# Patient Record
Sex: Male | Born: 1945 | Race: White | Hispanic: No | Marital: Married | Smoking: Never smoker
Health system: Western US, Community
[De-identification: ages and names within clinical notes are randomized; demographics above are authoritative.]

## PROBLEM LIST (undated history)

## (undated) DIAGNOSIS — E785 Hyperlipidemia, unspecified: Secondary | ICD-10-CM

## (undated) DIAGNOSIS — I10 Essential (primary) hypertension: Secondary | ICD-10-CM

## (undated) DIAGNOSIS — R011 Cardiac murmur, unspecified: Secondary | ICD-10-CM

## (undated) DIAGNOSIS — I2510 Atherosclerotic heart disease of native coronary artery without angina pectoris: Secondary | ICD-10-CM

## (undated) DIAGNOSIS — K219 Gastro-esophageal reflux disease without esophagitis: Secondary | ICD-10-CM

## (undated) HISTORY — DX: Atherosclerotic heart disease of native coronary artery without angina pectoris: I25.10

## (undated) HISTORY — DX: Gastro-esophageal reflux disease without esophagitis: K21.9

## (undated) HISTORY — PX: HERNIA REPAIR: SHX51

## (undated) HISTORY — DX: Cardiac murmur, unspecified: R01.1

## (undated) HISTORY — PX: CHOLECYSTECTOMY: SHX55

## (undated) HISTORY — PX: OTHER SURGICAL HISTORY: SHX169

---

## 2005-07-23 ENCOUNTER — Ambulatory Visit: Payer: Self-pay | Admitting: Internal Medicine

## 2005-09-03 ENCOUNTER — Ambulatory Visit: Payer: Self-pay | Admitting: Internal Medicine

## 2006-09-09 ENCOUNTER — Ambulatory Visit: Payer: Self-pay | Admitting: Internal Medicine

## 2006-09-09 LAB — CONVERTED CEMR LAB
ALT: 34 units/L (ref 0–40)
Albumin: 4.1 g/dL (ref 3.5–5.2)
Alkaline Phosphatase: 54 units/L (ref 39–117)
BUN: 15 mg/dL (ref 6–23)
Basophils Absolute: 0 10*3/uL (ref 0.0–0.1)
Basophils Relative: 0.5 % (ref 0.0–1.0)
CO2: 31 meq/L (ref 19–32)
Calcium: 9 mg/dL (ref 8.4–10.5)
Creatinine, Ser: 1 mg/dL (ref 0.4–1.5)
HDL: 41.2 mg/dL (ref >39.0)
LDL Cholesterol: 93 mg/dL (ref 0–99)
Monocytes Relative: 8 % (ref 3.0–11.0)
Platelets: 222 10*3/uL (ref 150–400)
RBC: 5.11 M/uL (ref 4.22–5.81)
RDW: 11.5 % (ref 11.5–14.6)
Total Bilirubin: 1.2 mg/dL (ref 0.3–1.2)
Total CHOL/HDL Ratio: 3.9
Total Protein: 7.2 g/dL (ref 6.0–8.3)
Triglycerides: 138 mg/dL (ref 0–149)
VLDL: 28 mg/dL (ref 0–40)

## 2006-09-16 ENCOUNTER — Ambulatory Visit: Payer: Self-pay | Admitting: Internal Medicine

## 2007-07-03 ENCOUNTER — Encounter: Payer: Self-pay | Admitting: Internal Medicine

## 2007-07-04 ENCOUNTER — Encounter: Payer: Self-pay | Admitting: Internal Medicine

## 2007-07-21 ENCOUNTER — Encounter: Payer: Self-pay | Admitting: Internal Medicine

## 2007-07-24 ENCOUNTER — Encounter: Payer: Self-pay | Admitting: Internal Medicine

## 2008-07-19 ENCOUNTER — Encounter: Payer: Self-pay | Admitting: Internal Medicine

## 2010-08-03 NOTE — Op Note (Signed)
 SURGEON: Marena Chancy, MD.  PROCEDURE:  Colonoscopy with biopsy.    PREOPERATIVE DIAGNOSIS:  Colorectal cancer screening.    POSTOPERATIVE DIAGNOSES:  1.  Tiny colon polyp at 20 cm from the anal verge, status post excisional  biopsy.  2.  Diverticulosis coli.    ASA SCORE:  ASA class II.    INFORMED CONSENT:  A full PARQ discussion was held. The patient signed the  consent form.    PREMEDICATIONS:  Propofol was given in divided doses and titrated to  effect.    DESCRIPTION OF THE PROCEDURE:  A digital rectal exam was done, which was  normal.  The flexible colonoscope was then inserted into the rectum and  advanced under direct visualization to the cecum, as shown by the ileocecal  valve and triangular ligament. The scope was slowly and carefully  withdrawn. A U-turn was done in the rectum.  Withdrawal time was over 6  minutes.    FINDINGS: The bowel prep was excellent.  There were no mass lesions or  areas of colitis.  There were diverticular changes in the left colon, which  were moderate.  At 20 cm there was a tiny excrescence of about 2 mm  diameter, which was taken with a single bite of the biopsy forceps.  No  other lesions were noted.  The scope was withdrawn.  The patient tolerated  the procedure well.  He was observed until awake and alert, and discharged  home in the company of his wife.    ASSESSMENT AND PLAN:  The patient will follow with Dr. Roxan Hockey on an  ongoing basis for his primary care.  I will be happy to see him in the  future as desired, but for now followup exam will be in 5 years if this is  an adenomatous polyp, or in 10 years if it is a hyperplastic polyp.                  Electronically Signed By  Marena Chancy, MD 08/03/2010 08:30 Demetrius Charity  Marena Chancy, MD    AH/bh  D: 08/02/2010 01:25 P   T: 08/03/2010 01:05 P   JOB: [id number]   DOCUMENT:  [id number]    cc: Marena Chancy, MD     DONALD P. ROBERTSON, DO     MICHAEL TRoxan Hockey, DO

## 2011-11-09 DIAGNOSIS — I10 Essential (primary) hypertension: Secondary | ICD-10-CM | POA: Insufficient documentation

## 2016-06-18 NOTE — Procedures (Signed)
[facility]  [address] [place], [place] [address]   [phone]    Pulmonary Function    Patient: Evan [name], Evan [name]    MRN: [id number]   Dept: OMD PMF         Adm Date: 06/14/2016      DOB: 07-Jul-1945     Date of Dictation:06/18/16  Referring Physician: Eloisa Northern August Saucer  is a 57 yrs year old male  with a BMI of 53 and a positive history of smoking who had the following pulmonary function test as an evaluation for emphysema and shortness of breath:  1. Spirometry with flow-volume loop pre and post acute bronchodilator therapy.  2. Lung volumes using plethysmography.  3. Carbon monoxide diffusion capacity.  4. 6-Minute Walk Test    RESULTS: For full results, please see Media tab in Chart Review in Epic.  Spirometry: obstruction without significant bronchodilator response  Lung Volumes: Normal  Diffuse Capacity: Mildly reduced  6-MWT:   The pt was able to walk 180 meters.  The predicted lower limit of normal in this patient's case is 413 meters.     Oxygenation: SaO2 At rest: 96%.  The lowest oxygen saturation during exercise was 92%.   Heart Rate Response: normal  Blood Pressure Response: Elevated blood pressure at rest    IMPRESSION:   COPD GOLD 2.    The diffusion capacity is moderately reduced. A reduced DLCO can be caused by anemia, heart disease, emphysema, pulmonary hypertension, pulmonary embolism, current tobacco use and interstitial lung disease. The workup for these varied etiologies should be directed by the signs and symptoms on clinic examination.     Exercise capacity is greatly reduced on . Oxygenation decreases with ambulation but does not meet requirement for supplemental oxygen. Heart rate response is normal. Blood pressure is elevated at baseline.          Electronically Signed by:  Claudell Kyle, MD  06/18/2016 20:28

## 2016-07-11 ENCOUNTER — Emergency Department: Admit: 2016-07-12 | Payer: PRIVATE HEALTH INSURANCE | Primary: DO

## 2016-07-11 DIAGNOSIS — A419 Sepsis, unspecified organism: Secondary | ICD-10-CM

## 2016-07-11 NOTE — ED Notes (Signed)
Agree with triage note. Breathing even and unlabored. No difficulty talking. Does not normally wear O2 at home. Requiring 4 L of O2 NC currently. Skin warm to touch. Chills x 1 day, denies being around any sick family members.

## 2016-07-11 NOTE — ED Provider Notes (Signed)
[facility] DEPART[facility] VISIT NOTE     History and Physical     Name: Evan [name]  DOB: 05-03-1946 71 y.o.  MRN: [id number]  CSN: [id number]    HISTORY:     Chief Complaint:   Chief Complaint   Patient presents with   . Weakness   . Shortness of Breath       HPI: This is a 71 y.o. male who presents with a complaint of feeling chills and feeling shaky today.  He denies actual shortness of breath although he is tachypneic and was satting in the high 80s per medics.[name] He then fell on the floor. He denies known injury. He doesn't remember falling.  He denies new shortness of breath.  Patient denied any chest pain,  abdominal pain, nausea, vomiting, diarrhea, constipation, melena, hematochezia, urinary symptoms, rash, headache.  He has had a nonproductive cough for 1-2 weeks.      Review of Systems: Is negative in all 10 systems except for noted above.    Nurses notes were reviewed.    Past Medical History:   Past Medical History:   Diagnosis Date   . COPD (chronic obstructive pulmonary disease) (CMS/HCC)    . Diabetes mellitus, type II (CMS/HCC)    . Hypercholesteremia    . Hypertension    . Obesity, morbid (CMS/HCC)        Past Surgical History: No past surgical history on file.    Medications:   Home Medications     No medications on file.          Social History:  Social History     Social History Main Topics   . Smoking status: Not on file   . Smokeless tobacco: Not on file   . Alcohol use No   . Drug use: No   . Sexual activity: Not on file     Social History   . Marital status: Married     Spouse name: N/A   . Number of children: N/A   . Years of education: N/A     Other Topics Concern   . Not on file       Family History: No family history on file.    Allergies:No Known Allergies    Physical Exam:   Initial Vitals [07/11/16 1938]   BP 132/69   Pulse 105   Resp (!) 26   Temp 38 ?C (100.4 ?F)   SpO2 (!) 88 %     General: Patient is mildly tachypneic but not in respiratory distress. Patient  appears in mild to moderate discomfort. He is somewhat tachypneic but denies feeling short of breath.  HEENT: Head is normocephalic and atraumatic.  Lids and lashes were normal.  Sclera is anicteric.  There is no conjunctival injection.  PERRL.  EOMI.   There is no nasal discharge and no epistasis.There is moist oral mucosa.  There is no mal-occlusion or trismus.  There is no pharyngeal exudate.  Uvula is midline. Neck is non-tender and supple.  There is a full range of motion without pain.  Cardiovascular: There is a regular tachycardia but normal rhythm.  No murmur is noted.  Radial pulses are 2/4 bilaterally. DP pulses are 1+ bilaterally.  Respiratory: The chest wall is non-tender.  There is equal expansion and equal breath sounds.  No retractions are noted.  There is decrease rest sounds bilaterally likely secondary to large body habitus.  Abdominal:  The abdomen is non-tender and not distended. There  were no peritoneal signs.  No mass or hepatosplenomegally was noted. No palpable aneurysm was noted. Bowel sounds were normal.  Musculoskeletal: Arms, legs and back were non-tender.  There was 2+ pitting edema in the pedal and pretibial region which she states is baseline.  No CVAT was noted.  Neuro: Speech was normal.  Strength was 5/5 bilaterally.  Gait was deferred.  Sensation was normal.    Psych: The patient is alert and oriented x 3.  Memory and insight were good.  Skin:  Skin is warm and dry.  Capillary refill was within 3 seconds. Patient has erythema and warmth over his left calf.  Lymph: There was no cervical lymphadenopathy noted.      Assessment and Plan: This is a 71 y.o. male presenting with fever, nonproductive cough, left extremity cellulitis, generalized weakness and sounds like he had a syncopal episode earlier today. He is somewhat tachypneic but lungs sounds are difficult to auscultate likely secondary to his large body habitus. He has been coughing. He denies any history of DVT, PE, recent  immobilization. Differential diagnosis includes sepsis, cellulitis, DVT, pneumonia, influenza, PE, intracranial hemorrhage, cardiac ischemia, arrhythmia, hypoglycemia, anemia, COPD exacerbation, CHF. UTI. Baseline work up was ordered to assess for the above. DuoNeb and Tylenol was ordered for the patient is well as generalized workup to assess for the above. Reassessment to follow.    Reassessment: On reassessment, patient was reassessed and work up was reviewed. Patient feels much improved after treatment. He is not dyspneic. He is on a nasal cannula with an O2 sat of 94%. Chest x-ray did not show an infiltrate although I suspect there still could be an occult pneumonia. He definitely has left calf cellulitis. The duplex of the lower extremities was somewhat limited in the calves but otherwise was negative. She does have an elevated troponin of 0.11 blood sugar was just over 300 and I ordered insulin 4 units subcutaneously. Patient has an elevated white blood cell count at 16 and a lactic acid level of 4. I chose cefepime for broad coverage. I've consulted the hospitalist transfer care, patient is in serious but improving condition. I ordered a liter of saline for him. As his chest x-ray shows mild central vascular congestion and I did not want to give him more IV fluid at this point. He is no longer short of breath and vital signs are stable at this point. At transfer of care to hospital service.    EKG shows right bundle branch block at 92 bpm with QRS duration 151 ms and QTC 413 ms. There is some nonspecific ST elevation in lead 3 area and I did run this by Dr. Allena Katz from cardiology who states this is a right bundle branch block with aberrancy and not a STEMI activation. Patient has no chest pain.  He noted the patient could use an echocardiogram tomorrow if troponin is elevated at that point cardiology should be consulted.      CT head without contrast   Final Result   IMPRESSION:   1.  Negative CT examination  of brain for acute abnormality.         X-ray chest PA or AP   Final Result   IMPRESSION:       1. Mild central vascular congestion.   2. Borderline mild cardiomegaly.            Ultrasound extremity bilateral venous duplex    (Results Pending)       Labs:   Results  for orders placed or performed during the hospital encounter of 07/11/16 (from the past 24 hour(s))   CBC with Auto Differential -STAT    Collection Time: 07/11/16  7:47 PM   Result Value Ref Range    WBC 16.6 (H) 4.8 - 10.8 10*3/?L    RBC 3.72 (L) 4.70 - 6.10 10*6/?L    Hemoglobin 12.5 12.0 - 18.0 g/dL    HCT 47.8 (L) 29.5 - 54.0 %    MCV 100.9 (H) 81.0 - 99.0 fL    MCH 33.6 27.0 - 34.0 pg    MCHC 33.3 32.0 - 36.0 g/dL    RDW 62.1 30.8 - 65.7 %    Platelet Count 170 150 - 400 10*3/?L    MPV 10.8 (H) 7.4 - 10.4 fL    Neutrophils % 93 (H) 35 - 70 %    Bands % 1 0 - 10 %    Lymphocytes % 2 (L) 25 - 45 %    Monocytes % 4 0 - 12 %    Eosinophils % 0 0 - 7 %    Basophils % 0 0 - 2 %    Neutrophils, Absolute 15.4 (H) 1.6 - 7.3 10*3/?L    Bands, Absolute 0.2 0.0 - 1.2 10*3/?L    Lymphocytes, Absolute 0.3 (L) 1.1 - 4.3 10*3/?L    Monocytes, Absolute 0.7 0.0 - 1.2 10*3/?L    Eosinophils, Absolute 0.0 0.0 - 0.7 10*3/?L    Basophils, Absolute 0.0 0.0 - 0.2 10*3/?L    Differential Type Manual Differential     Platelet Estimate Normal Normal    Macrocytes 1+ (A) (none)   Comprehensive Metabolic Panel -STAT    Collection Time: 07/11/16  7:47 PM   Result Value Ref Range    Sodium 137 135 - 143 mmol/L    Potassium 4.8 3.5 - 5.1 mmol/L    Chloride 103 98 - 111 mmol/L    CO2 - Carbon Dioxide 18.9 (L) 21.0 - 31.0 mmol/L    Glucose 306 (H) 80 - 99 mg/dL    BUN 36 (H) 6 - 23 mg/dL    Creatinine 8.46 (H) 0.65 - 1.30 mg/dL    Calcium 9.1 8.6 - 96.2 mg/dL    AST - Aspartate Aminotransferase 22 10 - 50 IU/L    ALT - Alanine Aminotransferase 21 7 - 52 IU/L    Alkaline Phosphatase 60 34 - 104 IU/L    Bilirubin Total 0.5 0.3 - 1.2 mg/dL    Protein Total 7.2 6.0 - 8.0 g/dL     Albumin 3.9 3.5 - 5.0 g/dL    Globulin 3.3 2.2 - 3.7 g/dL    Albumin/Globulin Ratio 1.2 >0.9    Anion Gap 15.1 (H) 3.0 - 11.0 mmol/L    GFR Estimate 37 (L) >=60 mL/min/1.80m*2    GFR Additional Info     Troponin-I ED -STAT    Collection Time: 07/11/16  7:47 PM   Result Value Ref Range    Troponin I 0.11 (HCrit) <=0.04 ng/mL   PT & PTT -STAT    Collection Time: 07/11/16  7:47 PM   Result Value Ref Range    Protime 12.4 10.0 - 13.0 Seconds    APTT 22 (L) 23 - 36 Seconds    INR 1.1 0.9 - 1.2   B-Type Natriuretic Peptide (BNP) -STAT    Collection Time: 07/11/16  7:47 PM   Result Value Ref Range    B-Type Natriuretic Peptide (BNP) 149 (H) 0 - 99 pg/mL  Rapid Influenza A & B by NAAT(Stat) -STAT    Collection Time: 07/11/16  9:17 PM   Result Value Ref Range    Influenza A Influenza A Viral RNA Not Detected Influenza A Viral RNA Not Detected    Influenza B Influenza B Viral RNA Not Detected Influenza B Viral RNA Not Detected   Blood Culture -x 2    Collection Time: 07/11/16  9:37 PM   Result Value Ref Range    Blood Culture Result No Growth to Date    Blood Culture -x 2    Collection Time: 07/11/16  9:37 PM   Result Value Ref Range    Blood Culture Result No Growth to Date    Lactic Acid (ED) -STAT    Collection Time: 07/11/16  9:37 PM   Result Value Ref Range    Lactic Acid ED 4.0 (H) 0.5 - 2.2 mmol/L   Urinalysis with Microscopic -Once    Collection Time: 07/11/16 10:17 PM   Result Value Ref Range    Color, Urine Yellow Yellow, Straw    Clarity, Urine Hazy (A) Clear    Glucose, Urine Negative Negative mg/dL    Bilirubin, Urine Negative Negative    Ketones, Urine Trace (A) Negative mg/dL    Specific Gravity, Urine 1.019 1.003 - 1.030    Blood, Urine 3+ (A) Negative    pH, UA 5.0 5.0 - 7.5    Protein, Urine 2+ (A) Negative mg/dL    Urobilinogen, Urine Normal Normal mg/dL    Nitrite, Urine Negative Negative    Leukocyte Esterase, Urine Negative Negative    Ascorbic Acid, Urine Negative Negative    White Blood Cell  Microscopic Numeric 1 <=9 /HPF    RBC, Urine 3 <=5 /HPF    Bacteria, Urine None Seen None Seen /HPF    Squamous Epithelial, Urine <1 <=5 /HPF    Hyaline Casts, Urine 1 <=5 /LPF    Mucous, Urine 1+ 0 /LPF     Critical Care Time: 45 minutes.     Time involved in the performance of a separately reportable procedure was not counted towards the total critical care Time.    Disposition: Admission    Diagnosis:    SNOMED CT(R)   1. Fever  FEVER   2. Weakness generalized  ASTHENIA   3. Fever, unknown origin  PYREXIA OF UNKNOWN ORIGIN   4. Cellulitis of left lower extremity  CELLULITIS OF LEFT LOWER LIMB   5. Syncope and collapse  SYNCOPE AND COLLAPSE   6. Fall, initial encounter  FALL   7. Elevated troponin  HIGH TROPONIN I LEVEL   8. Hyperglycemia  HYPERGLYCEMIA   9. COPD exacerbation (CMS/HCC)  ACUTE EXACERBATION OF CHRONIC OBSTRUCTIVE AIRWAYS DISEASE   10. Sepsis due to undetermined organism (CMS/HCC)  SEPSIS      Critical Care Time: 45 minutes.     Time involved in the performance of a separately reportable procedure was not counted towards the total critical care Time.        DECISION TO ADMIT:   1/24 2356        This dictation was produced using voice recognition software. Despite concurrent proofreading, please note that transcription errors are common and may not reflect the true intent of the reporter.     Alwyn Ren, DO  07/11/16 2358

## 2016-07-11 NOTE — ED Notes (Signed)
Pt to CT via gurney with transport and techs to assist with moving.

## 2016-07-11 NOTE — ED Triage Notes (Signed)
Pt reports increasing weakness with difficulty walking as a result. SOB, O2sats 88 on RA in ambo. Now 92% on 4L NC. Also reports urgency with urination. Febrile.

## 2016-07-11 NOTE — ED Notes (Signed)
LLE red and warm to touch.

## 2016-07-11 NOTE — ED Notes (Signed)
RT at bedside.

## 2016-07-11 NOTE — ED Notes (Signed)
Phleb at bedside. RT aware of need for breathing treatment

## 2016-07-11 NOTE — ED Notes (Signed)
Elelvated trop 0.11  MD notified.

## 2016-07-12 ENCOUNTER — Inpatient Hospital Stay: Admit: 2016-07-12 | Payer: PRIVATE HEALTH INSURANCE | Primary: DO

## 2016-07-12 ENCOUNTER — Inpatient Hospital Stay
Admission: EM | Admit: 2016-07-12 | Discharge: 2016-08-02 | Disposition: A | Discharge status: Skilled Nursing Facility | Payer: PRIVATE HEALTH INSURANCE | Admitting: MD

## 2016-07-12 LAB — CBC WITH AUTO DIFFERENTIAL
Bands %: 1 % (ref 0–10)
Bands, Absolute: 0.2 10*3/??L (ref 0.0–1.2)
Basophils %: 0 % (ref 0–2)
Basophils %: 1 % (ref 0–2)
Basophils, Absolute: 0 10*3/??L (ref 0.0–0.2)
Basophils, Absolute: 0.1 10*3/ÂµL (ref 0.0–0.2)
Eosinophils %: 0 % (ref 0–7)
Eosinophils %: 0 % (ref 0–7)
Eosinophils, Absolute: 0 10*3/??L (ref 0.0–0.7)
Eosinophils, Absolute: 0 10*3/ÂµL (ref 0.0–0.7)
HCT: 37.5 % — ABNORMAL LOW (ref 42.0–54.0)
HCT: 36.4 % — ABNORMAL LOW (ref 42.0–54.0)
Hemoglobin: 12.5 g/dL (ref 12.0–18.0)
Hemoglobin: 12 g/dL (ref 12.0–18.0)
Lymphocytes %: 2 % — ABNORMAL LOW (ref 25–45)
Lymphocytes %: 5 % — ABNORMAL LOW (ref 25–45)
Lymphocytes, Absolute: 0.3 10*3/??L — ABNORMAL LOW (ref 1.1–4.3)
Lymphocytes, Absolute: 1 10*3/??L — ABNORMAL LOW (ref 1.1–4.3)
MCH: 33.6 pg (ref 27.0–34.0)
MCH: 33.3 pg (ref 27.0–34.0)
MCHC: 33.3 g/dL (ref 32.0–36.0)
MCHC: 33 g/dL (ref 32.0–36.0)
MCV: 100.9 fL — ABNORMAL HIGH (ref 81.0–99.0)
MCV: 100.9 fL — ABNORMAL HIGH (ref 81.0–99.0)
MPV: 10.8 fL — ABNORMAL HIGH (ref 7.4–10.4)
MPV: 10.5 fL — ABNORMAL HIGH (ref 7.4–10.4)
Monocytes %: 4 % (ref 0–12)
Monocytes %: 6 % (ref 0–12)
Monocytes, Absolute: 0.7 10*3/??L (ref 0.0–1.2)
Monocytes, Absolute: 1.2 10*3/ÂµL (ref 0.0–1.2)
Neutrophils %: 93 % — ABNORMAL HIGH (ref 35–70)
Neutrophils %: 88 % — ABNORMAL HIGH (ref 35–70)
Neutrophils, Absolute: 15.4 10*3/??L — ABNORMAL HIGH (ref 1.6–7.3)
Neutrophils, Absolute: 17.2 10*3/ÂµL — ABNORMAL HIGH (ref 1.6–7.3)
Platelet Count: 170 10*3/??L (ref 150–400)
Platelet Count: 154 10*3/ÂµL (ref 150–400)
Platelet Estimate: NORMAL
RBC: 3.72 10*6/??L — ABNORMAL LOW (ref 4.70–6.10)
RBC: 3.6 10*6/ÂµL — ABNORMAL LOW (ref 4.70–6.10)
RDW: 14 % (ref 11.5–14.5)
RDW: 14.1 % (ref 11.5–14.5)
WBC: 16.6 10*3/??L — ABNORMAL HIGH (ref 4.8–10.8)
WBC: 19.6 10*3/??L — ABNORMAL HIGH (ref 4.8–10.8)

## 2016-07-12 LAB — UA WITH AUTO MICRO
Ascorbic Acid, Urine: NEGATIVE
Bacteria, Urine: NONE SEEN /HPF
Bilirubin, Urine: NEGATIVE
Glucose, Urine: NEGATIVE mg/dL
Hyaline Casts, Urine: 1 /LPF (ref <=5)
Leukocyte Esterase, Urine: NEGATIVE
Nitrite, Urine: NEGATIVE
RBC, Urine: 3 /HPF (ref <=5)
Specific Gravity, Urine: 1.019 (ref 1.003–1.030)
Squamous Epithelial, Urine: <1 /HPF (ref <=5)
Urobilinogen, Urine: NORMAL mg/dL
White Blood Cell Microscopic Numeric: 1 /HPF (ref <=9)
pH, UA: 5 (ref 5.0–7.5)

## 2016-07-12 LAB — TROPONIN I
Troponin I: 1.94 ng/mL (ref <=0.04)
Troponin I: 4.53 ng/mL (ref <=0.04)

## 2016-07-12 LAB — BASIC METABOLIC PANEL
Anion Gap: 13.1 mmol/L — ABNORMAL HIGH (ref 3.0–11.0)
Anion Gap: 12.4 mmol/L — ABNORMAL HIGH (ref 3.0–11.0)
BUN: 36 mg/dL — ABNORMAL HIGH (ref 6–23)
BUN: 35 mg/dL — ABNORMAL HIGH (ref 6–23)
CO2 - Carbon Dioxide: 21.9 mmol/L (ref 21.0–31.0)
CO2 - Carbon Dioxide: 21.6 mmol/L (ref 21.0–31.0)
Calcium: 8.9 mg/dL (ref 8.6–10.3)
Calcium: 8.6 mg/dL (ref 8.6–10.3)
Chloride: 103 mmol/L (ref 98–111)
Chloride: 102 mmol/L (ref 98–111)
Creatinine: 1.89 mg/dL — ABNORMAL HIGH (ref 0.65–1.30)
Creatinine: 1.88 mg/dL — ABNORMAL HIGH (ref 0.65–1.30)
GFR Estimate: 35 mL/min/{1.73_m2} — ABNORMAL LOW (ref >=60)
GFR Estimate: 36 mL/min/{1.73_m2} — ABNORMAL LOW (ref >=60)
Glucose: 259 mg/dL — ABNORMAL HIGH (ref 80–99)
Glucose: 310 mg/dL — ABNORMAL HIGH (ref 80–99)
Potassium: 5.3 mmol/L — ABNORMAL HIGH (ref 3.5–5.1)
Potassium: 3.8 mmol/L (ref 3.5–5.1)
Sodium: 138 mmol/L (ref 135–143)
Sodium: 136 mmol/L (ref 135–143)

## 2016-07-12 LAB — BLOOD CULTURE MRSA BY PCR
MRSA PCR Result: NOT DETECTED
Staph aureus PCR Result: NOT DETECTED

## 2016-07-12 LAB — PT & PTT
APTT: 22 Seconds — ABNORMAL LOW (ref 23–36)
INR: 1.1 (ref 0.9–1.2)
Protime: 12.4 Seconds (ref 10.0–13.0)

## 2016-07-12 LAB — LACTIC ACID
Lactic Acid: 2.7 mmol/L — ABNORMAL HIGH (ref 0.5–2.2)
Lactic Acid: 2.4 mmol/L — ABNORMAL HIGH (ref 0.0–2.2)

## 2016-07-12 LAB — TROPONIN-I ED: Troponin I: 0.11 ng/mL (ref <=0.04)

## 2016-07-12 LAB — COMPREHENSIVE METABOLIC PANEL
ALT - Alanine Aminotransferase: 21 IU/L (ref 7–52)
AST - Aspartate Aminotransferase: 22 IU/L (ref 10–50)
Albumin/Globulin Ratio: 1.2 (ref >0.9)
Albumin: 3.9 g/dL (ref 3.5–5.0)
Alkaline Phosphatase: 60 IU/L (ref 34–104)
Anion Gap: 15.1 mmol/L — ABNORMAL HIGH (ref 3.0–11.0)
BUN: 36 mg/dL — ABNORMAL HIGH (ref 6–23)
Bilirubin Total: 0.5 mg/dL (ref 0.3–1.2)
CO2 - Carbon Dioxide: 18.9 mmol/L — ABNORMAL LOW (ref 21.0–31.0)
Calcium: 9.1 mg/dL (ref 8.6–10.3)
Chloride: 103 mmol/L (ref 98–111)
Creatinine: 1.82 mg/dL — ABNORMAL HIGH (ref 0.65–1.30)
GFR Estimate: 37 mL/min/{1.73_m2} — ABNORMAL LOW (ref >=60)
Globulin: 3.3 g/dL (ref 2.2–3.7)
Glucose: 306 mg/dL — ABNORMAL HIGH (ref 80–99)
Potassium: 4.8 mmol/L (ref 3.5–5.1)
Protein Total: 7.2 g/dL (ref 6.0–8.0)
Sodium: 137 mmol/L (ref 135–143)

## 2016-07-12 LAB — POCT GLUCOSE
POC Glucose: 309 mg/dL — ABNORMAL HIGH (ref 80–99)
POC Glucose: 286 mg/dL — ABNORMAL HIGH (ref 80–99)

## 2016-07-12 LAB — LACTIC ACID ED: Lactic Acid ED: 4 mmol/L — ABNORMAL HIGH (ref 0.5–2.2)

## 2016-07-12 LAB — ED INFORMATION EXCHANGE

## 2016-07-12 LAB — BLOOD CULTURE
Blood Culture Result: NO GROWTH
Blood Culture Result: POSITIVE — AB

## 2016-07-12 LAB — B-TYPE NATRIURETIC PEPTIDE: B-Type Natriuretic Peptide - BNP: 149 pg/mL — ABNORMAL HIGH (ref 0–99)

## 2016-07-12 LAB — INFLUENZA A & B
Influenza A: NOT DETECTED
Influenza B: NOT DETECTED

## 2016-07-12 LAB — MAGNESIUM: Magnesium: 1.7 mg/dL (ref 1.6–2.4)

## 2016-07-12 MED ORDER — albuterol (PROVENTIL HFA;VENTOLIN HFA;PROAIR HFA) inhaler 2 puff
90 | RESPIRATORY_TRACT | Status: DC | PRN
Start: 2016-07-12 — End: 2016-07-12

## 2016-07-12 MED ORDER — enoxaparin (LOVENOX) syringe 150 mg
150 | Freq: Two times a day (BID) | SUBCUTANEOUS | Status: DC
Start: 2016-07-12 — End: 2016-07-15
  Administered 2016-07-13 – 2016-07-15 (×5): 150 mg via SUBCUTANEOUS

## 2016-07-12 MED ORDER — cefTRIAXone (ROCEPHIN) 1 g in sterile water 10 mL IV syringe
1 | INTRAVENOUS | Status: DC
Start: 2016-07-12 — End: 2016-07-12
  Administered 2016-07-12: 11:00:00 via INTRAVENOUS

## 2016-07-12 MED ORDER — polyvinyl alcohol (LIQUIFILM TEARS) 1.4 % ophthalmic solution 1-2 drop
1.4 | OPHTHALMIC | Status: DC | PRN
Start: 2016-07-12 — End: 2016-08-02

## 2016-07-12 MED ORDER — acetaminophen (TYLENOL) tablet 650 mg
325 | Freq: Once | ORAL | Status: AC
Start: 2016-07-12 — End: 2016-07-11
  Administered 2016-07-12: 05:00:00 325 mg via ORAL

## 2016-07-12 MED ORDER — glucagon (human recombinant) injection 1 mg
1 | INTRAMUSCULAR | Status: DC | PRN
Start: 2016-07-12 — End: 2016-08-02

## 2016-07-12 MED ORDER — benzocaine-menthol (CEPACOL) lozenge 1 lozenge
15-2.6 | Status: DC | PRN
Start: 2016-07-12 — End: 2016-08-02
  Administered 2016-07-17 – 2016-07-18 (×2): via ORAL

## 2016-07-12 MED ORDER — aspirin EC tablet 81 mg
81 | Freq: Every day | ORAL | Status: DC
Start: 2016-07-12 — End: 2016-08-02
  Administered 2016-07-12 – 2016-08-02 (×22): 81 mg via ORAL

## 2016-07-12 MED ORDER — dextrose (GLUCOSE SHOT) oral liquid 30 g
15 | ORAL | Status: DC | PRN
Start: 2016-07-12 — End: 2016-08-02

## 2016-07-12 MED ORDER — phenol (CHLORASEPTIC) 1.4 % throat spray 1-2 spray
1.4 | Status: DC | PRN
Start: 2016-07-12 — End: 2016-08-02

## 2016-07-12 MED ORDER — nicotine polacrilex (NICORETTE) gum 2 mg
2 | BUCCAL | Status: DC | PRN
Start: 2016-07-12 — End: 2016-08-02

## 2016-07-12 MED ORDER — insulin glargine (LANTUS) injection 14 Units
100 | Freq: Every evening | SUBCUTANEOUS | Status: DC
Start: 2016-07-12 — End: 2016-07-13
  Administered 2016-07-13: 05:00:00 100 [IU] via SUBCUTANEOUS

## 2016-07-12 MED ORDER — albuterol (PROVENTIL) nebulizer solution 2.5 mg
2.5 | Freq: Four times a day (QID) | RESPIRATORY_TRACT | Status: DC
Start: 2016-07-12 — End: 2016-07-15
  Administered 2016-07-12 – 2016-07-15 (×8): 2.5 mg via RESPIRATORY_TRACT

## 2016-07-12 MED ORDER — cefTRIAXone (ROCEPHIN) 1 g in sterile water 10 mL IV syringe
1 | INTRAMUSCULAR | Status: DC
Start: 2016-07-12 — End: 2016-07-14
  Administered 2016-07-12 – 2016-07-14 (×3): via INTRAVENOUS

## 2016-07-12 MED ORDER — acetaminophen (TYLENOL) tablet 650 mg
325 | ORAL | Status: DC | PRN
Start: 2016-07-12 — End: 2016-08-02
  Administered 2016-07-12 – 2016-07-14 (×3): 325 mg via ORAL

## 2016-07-12 MED ORDER — insulin glargine (LANTUS) injection 10 Units
100 | Freq: Every evening | SUBCUTANEOUS | Status: DC
Start: 2016-07-12 — End: 2016-07-12
  Administered 2016-07-12: 11:00:00 100 [IU] via SUBCUTANEOUS

## 2016-07-12 MED ORDER — dextrose 50 % syringe 12.5-25 g
INTRAVENOUS | Status: DC | PRN
Start: 2016-07-12 — End: 2016-08-02

## 2016-07-12 MED ORDER — HYDROcodone-acetaminophen (NORCO) 5-325 mg per tablet 1 tablet
5-325 | Freq: Four times a day (QID) | ORAL | Status: DC | PRN
Start: 2016-07-12 — End: 2016-08-02
  Administered 2016-07-18: 18:00:00 5-325 via ORAL

## 2016-07-12 MED ORDER — ipratropium-albuterol (DUO-NEB) 0.5 mg-3 mg(2.5 mg base)/3 mL nebulizer solution 3 mL
0.5 | Freq: Once | RESPIRATORY_TRACT | Status: AC
Start: 2016-07-12 — End: 2016-07-11
  Administered 2016-07-12: 06:00:00 0.5 mL via RESPIRATORY_TRACT

## 2016-07-12 MED ORDER — sodium chloride 0.9 % (NS) syringe 5-10 mL
Freq: Three times a day (TID) | INTRAMUSCULAR | Status: DC
Start: 2016-07-12 — End: 2016-08-02
  Administered 2016-07-12 – 2016-08-02 (×65): via INTRAVENOUS

## 2016-07-12 MED ORDER — metoprolol tartrate (LOPRESSOR) tablet 100 mg
50 | Freq: Two times a day (BID) | ORAL | Status: DC
Start: 2016-07-12 — End: 2016-07-14
  Administered 2016-07-12 – 2016-07-14 (×4): 50 mg via ORAL

## 2016-07-12 MED ORDER — enoxaparin (LOVENOX) syringe 150 mg
150 | Freq: Once | SUBCUTANEOUS | Status: AC
Start: 2016-07-12 — End: 2016-07-12
  Administered 2016-07-13: 01:00:00 150 mg via SUBCUTANEOUS

## 2016-07-12 MED ORDER — polyethylene glycol (MIRALAX) packet 17 g
17 | Freq: Every day | ORAL | Status: DC | PRN
Start: 2016-07-12 — End: 2016-07-17
  Administered 2016-07-17: 06:00:00 17 g via ORAL

## 2016-07-12 MED ORDER — albuterol (PROVENTIL) nebulizer solution 2.5 mg
2.5 | RESPIRATORY_TRACT | Status: DC | PRN
Start: 2016-07-12 — End: 2016-07-16

## 2016-07-12 MED ORDER — bisacodyl (DULCOLAX) suppository 10 mg
10 | Freq: Every day | RECTAL | Status: DC | PRN
Start: 2016-07-12 — End: 2016-08-02
  Administered 2016-07-22: 17:00:00 10 mg via RECTAL

## 2016-07-12 MED ORDER — insulin aspart (NovoLOG) PEN 4 Units
100 | Freq: Once | SUBCUTANEOUS | Status: AC
Start: 2016-07-12 — End: 2016-07-11
  Administered 2016-07-12: 08:00:00 100 [IU] via SUBCUTANEOUS

## 2016-07-12 MED ORDER — acetaminophen (TYLENOL) tablet 325 mg
325 | ORAL | Status: DC | PRN
Start: 2016-07-12 — End: 2016-07-12
  Administered 2016-07-12: 12:00:00 325 mg via ORAL

## 2016-07-12 MED ORDER — insulin aspart (NovoLOG) PEN
100 | Freq: Four times a day (QID) | SUBCUTANEOUS | Status: DC
Start: 2016-07-12 — End: 2016-07-27
  Administered 2016-07-12 – 2016-07-28 (×59): 100 unit/mL (3 mL) via SUBCUTANEOUS

## 2016-07-12 MED ORDER — atorvastatin (LIPITOR) tablet 20 mg
20 | Freq: Every evening | ORAL | Status: DC
Start: 2016-07-12 — End: 2016-08-02
  Administered 2016-07-13 – 2016-08-02 (×21): 20 mg via ORAL

## 2016-07-12 MED ORDER — aspirin 81 MG chewable tablet 162 mg
81 | Freq: Once | ORAL | Status: AC
Start: 2016-07-12 — End: 2016-07-11
  Administered 2016-07-12: 08:00:00 81 mg via ORAL

## 2016-07-12 MED ORDER — enoxaparin (LOVENOX) syringe 150 mg
150 | Freq: Once | SUBCUTANEOUS | Status: AC
Start: 2016-07-12 — End: 2016-07-13
  Administered 2016-07-13: 09:00:00 150 mg via SUBCUTANEOUS

## 2016-07-12 MED ORDER — enoxaparin (LOVENOX) syringe 150 mg
150 | Freq: Two times a day (BID) | SUBCUTANEOUS | Status: DC
Start: 2016-07-12 — End: 2016-07-12

## 2016-07-12 MED ORDER — urea (CARMOL) 10 % cream
10 | TOPICAL | Status: DC | PRN
Start: 2016-07-12 — End: 2016-08-02
  Administered 2016-07-13: 13:00:00 10 % via TOPICAL

## 2016-07-12 MED ORDER — sodium chloride (NS) 0.9% bolus 1,000 mL
Freq: Once | INTRAVENOUS | Status: AC
Start: 2016-07-12 — End: 2016-07-12
  Administered 2016-07-12 (×2): via INTRAVENOUS

## 2016-07-12 MED ORDER — magnesium hydroxide (MILK OF MAGNESIA) 400 mg/5 mL oral suspension 30 mL
400 | Freq: Two times a day (BID) | ORAL | Status: DC | PRN
Start: 2016-07-12 — End: 2016-08-02
  Administered 2016-07-22: 17:00:00 400 mL via ORAL

## 2016-07-12 MED ORDER — albuterol (PROVENTIL) nebulizer solution 2.5 mg
2.5 | RESPIRATORY_TRACT | Status: DC | PRN
Start: 2016-07-12 — End: 2016-07-12
  Administered 2016-07-12: 12:00:00 2.5 mg via RESPIRATORY_TRACT

## 2016-07-12 MED ORDER — insulin aspart (NovoLOG) PEN
100 | Freq: Three times a day (TID) | SUBCUTANEOUS | Status: DC
Start: 2016-07-12 — End: 2016-07-30
  Administered 2016-07-12 – 2016-07-30 (×46): 100 unit/mL (3 mL) via SUBCUTANEOUS

## 2016-07-12 MED ORDER — enoxaparin (LOVENOX) syringe 40 mg
40 | Freq: Every day | SUBCUTANEOUS | Status: DC
Start: 2016-07-12 — End: 2016-07-12
  Administered 2016-07-12: 18:00:00 40 mg via SUBCUTANEOUS

## 2016-07-12 MED ORDER — cefepime (MAXIPIME) 2 g in sterile water 10 mL IV syringe
1 | Freq: Once | INTRAVENOUS | Status: AC
Start: 2016-07-12 — End: 2016-07-12
  Administered 2016-07-12: 08:00:00 via INTRAVENOUS

## 2016-07-12 MED ORDER — sodium chloride 0.9 % (NS) syringe 5-10 mL
INTRAMUSCULAR | Status: DC | PRN
Start: 2016-07-12 — End: 2016-08-02
  Administered 2016-07-18 – 2016-07-30 (×4): via INTRAVENOUS

## 2016-07-12 MED ORDER — ceFAZolin (ANCEF) 2 g in D5W IVPB Premix
2 | Freq: Three times a day (TID) | INTRAVENOUS | Status: DC
Start: 2016-07-12 — End: 2016-07-12

## 2016-07-12 MED ORDER — melatonin tablet 3 mg
3 | Freq: Every evening | ORAL | Status: DC | PRN
Start: 2016-07-12 — End: 2016-08-02
  Administered 2016-07-15 – 2016-07-30 (×3): 3 mg via ORAL

## 2016-07-12 MED ORDER — sodium chloride 0.9 % infusion
INTRAVENOUS | Status: DC
Start: 2016-07-12 — End: 2016-07-15
  Administered 2016-07-12 – 2016-07-15 (×11): via INTRAVENOUS

## 2016-07-12 MED ORDER — perflutren lipid microspheres (DEFINITY) injection 1.32 mg
1.1 | Freq: Once | INTRAVENOUS | Status: AC
Start: 2016-07-12 — End: 2016-07-12
  Administered 2016-07-12: 18:00:00 1.1 mL via INTRAVENOUS

## 2016-07-12 MED ORDER — dextrose (GLUCOSE SHOT) oral liquid 15 g
15 | ORAL | Status: DC | PRN
Start: 2016-07-12 — End: 2016-08-02

## 2016-07-12 MED FILL — ASPIRIN 81 MG CHEWABLE TABLET: 81 mg | ORAL | Qty: 2

## 2016-07-12 MED FILL — CEFAZOLIN 2 GRAM/50 ML IN DEXTROSE (ISO-OSMOTIC) INTRAVENOUS PIGGYBACK: 2 gram/50 mL | INTRAVENOUS | Qty: 50

## 2016-07-12 MED FILL — METOPROLOL TARTRATE 50 MG TABLET: 50 mg | ORAL | Qty: 2

## 2016-07-12 MED FILL — ENOXAPARIN 40 MG/0.4 ML SUBCUTANEOUS SYRINGE: 40 | SUBCUTANEOUS | Qty: 1

## 2016-07-12 MED FILL — ACETAMINOPHEN 325 MG TABLET: 325 mg | ORAL | Qty: 2

## 2016-07-12 MED FILL — ALBUTEROL SULFATE 2.5 MG/3 ML (0.083 %) SOLUTION FOR NEBULIZATION: 2.5 mg/3 mL (0.083 %) | RESPIRATORY_TRACT | Qty: 3

## 2016-07-12 MED FILL — ENOXAPARIN 150 MG/ML SUBCUTANEOUS SYRINGE: 150 mg/mL | SUBCUTANEOUS | Qty: 1

## 2016-07-12 MED FILL — NOVOLOG FLEXPEN U-100 INSULIN ASPART 100 UNIT/ML (3 ML) SUBCUTANEOUS: 100 unit/mL (3 mL) | SUBCUTANEOUS | Qty: 3

## 2016-07-12 MED FILL — ACETAMINOPHEN 325 MG TABLET: 325 mg | ORAL | Qty: 1

## 2016-07-12 MED FILL — CEFEPIME 2 GRAM SOLUTION FOR INJECTION: 2 g | INTRAMUSCULAR | Qty: 2

## 2016-07-12 MED FILL — LANTUS U-100 INSULIN 100 UNIT/ML SUBCUTANEOUS SOLUTION: 100 [IU]/mL | SUBCUTANEOUS | Qty: 0.1

## 2016-07-12 MED FILL — CEFTRIAXONE 1 GRAM SOLUTION FOR INJECTION: 1 g | INTRAMUSCULAR | Qty: 1

## 2016-07-12 MED FILL — ASPIRIN 81 MG TABLET,ENTERIC COATED: 81 mg | ORAL | Qty: 1

## 2016-07-12 NOTE — Progress Notes (Signed)
Pt arrived to floor via bed, pt denies pain Dr Suzi Roots notified , pt placed on bipap.

## 2016-07-12 NOTE — Progress Notes (Signed)
1133 patient febrile 102 F. Gave tylenol 650 mg. At 1332 patient remained slightly febrile at 100.0  Will re-check in an hour.

## 2016-07-12 NOTE — Significant Event (Addendum)
Troponin increased over to 4. He has never had chest pain at this time I'm going to put him on full dose Lovenox for now even though this may simply be demand ischemia. I think he'll benefit from a stress test prior to discharge to risk assess him or we could even consider doing a cardiac catheterization. He is already on a beta blocker and statin and aspirin.  We will continue to follow him very closely for this. 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 This is according to the family discussion with the nurse so we will order alcohol withdrawal protocol.    I also called his daughter to give her an  Update. In addition, his blood has gram positive cocci in one bottle resembling staph. Vanco is ordered now. In sum, he has a N-STEMI being treated with anticoagulation, aspirin, beta blocker, statin.  He has bacteremia and cellulitis on Rocephin and vanco.  He has a cough so Rocephin will cover respiratory bacteria as well, if it is not viral. He has uncontrolled diabetes so lantus is increased, he has possible early alcohol withdrawal so appropriate orders are written.     In total, I have spent 55 min with this patient in addition to the 49 min spent by Dr. Alphonsus Sias.    Shaaron Adler, DO

## 2016-07-12 NOTE — Plan of Care (Signed)
End of Shift Report     Significant Events New ED admit. Elevated troponin, MD paged, pt asymptomatic.     Pt A&O X4. Denies pain. Elevated temp this shift (38.4), administered PRN tylenol and increased frequency of VS (Q2) as pt is admitted for sepsis. Temp down to 37.9.      Patient/Family  Questions/Concerns None voiced.      DC Barriers Medical stability.

## 2016-07-12 NOTE — H&P (Signed)
History and Physical      Name: Evan [name]  DOB: July 29, 1945 71 y.o.  MRN: [id number]  CSN: [id number]    History:     Chief Complaint:  Weakness    History of Present Illness:  Evan [name] is a 71 y.o. male with history of morbid obesity, hypertension, COPD with prior tobacco use, insulin-dependent type 2 diabetes mellitus, hyperlipidemia and OSA on CPAP. Patient resides home with his wife. 2 days ago he was started on insulin therapy. Also about 2 days ago he noted increased erythema and warmth and tenderness of his left lower leg. 3 known well until this afternoon when he developed chills at home. His been weaker than normal. Seeming to get out of bed and had a syncopal episode. His wife found him and he thinks he must of been unconscious for a few minutes. Patient denied any chest pain or palpitations and no warning sign. He denied any shortness of breath. No abdominal pain, no nausea or vomiting, no dysuria. When EMS arrived he was reportedly hypoxic and was placed on nasal cannula. Labs in the emergency department showed leukocytosis with left shift, metabolic acidosis and lactic acid of 4, acute kidney injury to be an of 36 and creatinine 1.8 and hyperglycemia. EKG with right bundle branch 4. No prior EKG for comparison, troponin is 0.11. Influenza screen negative, blood cultures obtained, chest x-ray with mild central vascular congestion.  Lower extremity ultrasound negative for DVT. She was given a liter of saline, started on cefepime and referred for admission.    Took all his medications this morning. Did not take his insulin yet.  No reported trauma, no witnessed seizure activity, no incontinence. Negative head CT in ED.       Past Medical History:  COPD  Insulin-dependent type 2 diabetes but  Hypercholesterolemia  Hypertension  Morbid obesity  Prior tobacco use  Obstructive sleep apnea on C Pap  History of melanoma  Stage II chronic kidney disease  Environmental  allergies  Cataracts  Testosterone deficits    Past Surgical History:  Hernia surgery  Nasal polyp surgery in 2001  Eye surgery    Prior to admission medications:  Prior to Admission Medications    Medication Dose & Frequency   albuterol (PROAIR HFA) 90 mcg/actuation inhaler INHALE ONE PUFF BY MOUTH EVERY 4 HOURS AS NEEDED   allopurinol (ZYLOPRIM) 300 MG tablet 300 mg.   aspirin 81 MG EC tablet 81 mg.   atorvastatin (LIPITOR) 20 MG tablet TAKE ONE TABLET BY MOUTH EVERY EVENING   glimepiride (AMARYL) 4 MG tablet TAKE TWO TABLETS BY MOUTH EVERY MORNING BEFORE BREAKFAST   hydroCHLOROthiazide (HYDRODIURIL) 25 MG tablet TAKE ONE TABLET BY MOUTH EVERY DAY   insulin glargine (LANTUS) 100 unit/mL injection Inject 10 Units under the skin nightly. Increase by 2 units every 3 days to target fasting blood glucose of 120.   lisinopril (PRINIVIL,ZESTRIL) 40 MG tablet TAKE ONE TABLET BY MOUTH EVERY DAY   metFORMIN (GLUCOPHAGE) 1000 MG tablet TAKE ONE TABLET BY MOUTH TWICE A DAY   metoprolol tartrate (LOPRESSOR) 100 MG tablet TAKE ONE TABLET BY MOUTH TWICE A DAY   omega-3 acid ethyl esters (LOVAZA) 1 gram capsule 3 by mouth at bedtime   spironolactone (ALDACTONE) 25 MG tablet TAKE TWO TABLETS BY MOUTH EVERY DAY   testosterone (ANDROGEL) 12.5 mg/ 1.25 gram (1 %) GlPm Apply 4 Act topically Daily.   cyanocobalamin (VITAMIN B-12) 1000 MCG tablet Take 1,000 mcg by mouth 2  times daily.     triamcinolone (KENALOG) 0.1 % cream Apply  topically 2 times daily.     Allergies:  Patient has no known allergies.  Family History:  Adopted.  Children without any medical history     Social History:  Smoking in 2011     Immunization:    There is no immunization history on file for this patient.  Review of Systems:  Review of Systems   Constitutional: Positive for chills, diaphoresis and malaise/fatigue.   HENT: Negative.    Respiratory: Positive for cough. Negative for sputum production, shortness of breath and wheezing.    Cardiovascular: Positive  for leg swelling. Negative for chest pain and orthopnea.   Gastrointestinal: Negative for abdominal pain, diarrhea, nausea and vomiting.   Genitourinary: Negative.    Musculoskeletal: Negative for myalgias.   Skin:        + Left lower extremity redness     Neurological: Positive for loss of consciousness. Negative for dizziness, tremors, sensory change, speech change, focal weakness, seizures and headaches.   Psychiatric/Behavioral: Negative.        Physical Exam:     Vital Signs:  Initial Vitals [07/11/16 1938]   BP 132/69   Pulse 105   Resp (!) 26   Temp 38 ?C (100.4 ?F)   SpO2 (!) 88 %     Most Recent Vitals  BP: 114/62  Pulse: 93  Resp: 18  Temp: 37.7 ?C (99.9 ?F)  SpO2: 94 % (01/24 2354-01/25 0027)    Exam:  Physical Exam    Data:   Diagnostic studies: Available data and images were reviewed personally.  See reports. Significant results and findings are addressed here or in the Assessment and Plan.    Labs:  Results for orders placed or performed during the hospital encounter of 07/11/16 (from the past 24 hour(s))   CBC with Auto Differential -STAT    Collection Time: 07/11/16  7:47 PM   Result Value Ref Range    WBC 16.6 (H) 4.8 - 10.8 10*3/?L    RBC 3.72 (L) 4.70 - 6.10 10*6/?L    Hemoglobin 12.5 12.0 - 18.0 g/dL    HCT 34.7 (L) 42.5 - 54.0 %    MCV 100.9 (H) 81.0 - 99.0 fL    MCH 33.6 27.0 - 34.0 pg    MCHC 33.3 32.0 - 36.0 g/dL    RDW 95.6 38.7 - 56.4 %    Platelet Count 170 150 - 400 10*3/?L    MPV 10.8 (H) 7.4 - 10.4 fL    Neutrophils % 93 (H) 35 - 70 %    Bands % 1 0 - 10 %    Lymphocytes % 2 (L) 25 - 45 %    Monocytes % 4 0 - 12 %    Eosinophils % 0 0 - 7 %    Basophils % 0 0 - 2 %    Neutrophils, Absolute 15.4 (H) 1.6 - 7.3 10*3/?L    Bands, Absolute 0.2 0.0 - 1.2 10*3/?L    Lymphocytes, Absolute 0.3 (L) 1.1 - 4.3 10*3/?L    Monocytes, Absolute 0.7 0.0 - 1.2 10*3/?L    Eosinophils, Absolute 0.0 0.0 - 0.7 10*3/?L    Basophils, Absolute 0.0 0.0 - 0.2 10*3/?L    Differential Type Manual Differential      Platelet Estimate Normal Normal    Macrocytes 1+ (A) (none)   Comprehensive Metabolic Panel -STAT    Collection Time: 07/11/16  7:47 PM  Result Value Ref Range    Sodium 137 135 - 143 mmol/L    Potassium 4.8 3.5 - 5.1 mmol/L    Chloride 103 98 - 111 mmol/L    CO2 - Carbon Dioxide 18.9 (L) 21.0 - 31.0 mmol/L    Glucose 306 (H) 80 - 99 mg/dL    BUN 36 (H) 6 - 23 mg/dL    Creatinine 7.82 (H) 0.65 - 1.30 mg/dL    Calcium 9.1 8.6 - 95.6 mg/dL    AST - Aspartate Aminotransferase 22 10 - 50 IU/L    ALT - Alanine Aminotransferase 21 7 - 52 IU/L    Alkaline Phosphatase 60 34 - 104 IU/L    Bilirubin Total 0.5 0.3 - 1.2 mg/dL    Protein Total 7.2 6.0 - 8.0 g/dL    Albumin 3.9 3.5 - 5.0 g/dL    Globulin 3.3 2.2 - 3.7 g/dL    Albumin/Globulin Ratio 1.2 >0.9    Anion Gap 15.1 (H) 3.0 - 11.0 mmol/L    GFR Estimate 37 (L) >=60 mL/min/1.57m*2    GFR Additional Info     Troponin-I ED -STAT    Collection Time: 07/11/16  7:47 PM   Result Value Ref Range    Troponin I 0.11 (HCrit) <=0.04 ng/mL   PT & PTT -STAT    Collection Time: 07/11/16  7:47 PM   Result Value Ref Range    Protime 12.4 10.0 - 13.0 Seconds    APTT 22 (L) 23 - 36 Seconds    INR 1.1 0.9 - 1.2   B-Type Natriuretic Peptide (BNP) -STAT    Collection Time: 07/11/16  7:47 PM   Result Value Ref Range    B-Type Natriuretic Peptide (BNP) 149 (H) 0 - 99 pg/mL   Rapid Influenza A & B by NAAT(Stat) -STAT    Collection Time: 07/11/16  9:17 PM   Result Value Ref Range    Influenza A Influenza A Viral RNA Not Detected Influenza A Viral RNA Not Detected    Influenza B Influenza B Viral RNA Not Detected Influenza B Viral RNA Not Detected   Blood Culture -x 2    Collection Time: 07/11/16  9:37 PM   Result Value Ref Range    Blood Culture Result No Growth to Date    Blood Culture -x 2    Collection Time: 07/11/16  9:37 PM   Result Value Ref Range    Blood Culture Result No Growth to Date    Lactic Acid (ED) -STAT    Collection Time: 07/11/16  9:37 PM   Result Value Ref Range    Lactic  Acid ED 4.0 (H) 0.5 - 2.2 mmol/L   Urinalysis with Microscopic -Once    Collection Time: 07/11/16 10:17 PM   Result Value Ref Range    Color, Urine Yellow Yellow, Straw    Clarity, Urine Hazy (A) Clear    Glucose, Urine Negative Negative mg/dL    Bilirubin, Urine Negative Negative    Ketones, Urine Trace (A) Negative mg/dL    Specific Gravity, Urine 1.019 1.003 - 1.030    Blood, Urine 3+ (A) Negative    pH, UA 5.0 5.0 - 7.5    Protein, Urine 2+ (A) Negative mg/dL    Urobilinogen, Urine Normal Normal mg/dL    Nitrite, Urine Negative Negative    Leukocyte Esterase, Urine Negative Negative    Ascorbic Acid, Urine Negative Negative    White Blood Cell Microscopic Numeric 1 <=9 /HPF  RBC, Urine 3 <=5 /HPF    Bacteria, Urine None Seen None Seen /HPF    Squamous Epithelial, Urine <1 <=5 /HPF    Hyaline Casts, Urine 1 <=5 /LPF    Mucous, Urine 1+ 0 /LPF          Imaging for last 24 hours:  X-ray Chest Pa Or Ap    Result Date: 07/11/2016  IMPRESSION: 1. Mild central vascular congestion. 2. Borderline mild cardiomegaly.     Ct Head Without Contrast    Result Date: 07/11/2016  IMPRESSION: 1.  Negative CT examination of brain for acute abnormality.       CT head without contrast   Final Result   IMPRESSION:   1.  Negative CT examination of brain for acute abnormality.         X-ray chest PA or AP   Final Result   IMPRESSION:       1. Mild central vascular congestion.   2. Borderline mild cardiomegaly.            Ultrasound extremity bilateral venous duplex    (Results Pending)     Diagnoses:  Severe sepsis present on admission with Lactic acidosis and acute kidney  Left lower extremity cellulitis  Insulin-dependent type 2 diabetes mellitus with hyperglycemia  Non-ST elevation myocardial infarct  Metabolic/lactic acidosis   Morbid obesity  COPD  OSA on CPAP     Assessment:   Morbidly obese 71 year old diabetic male was recently started on insulin for type 2 diabetes presents after syncopal episode and was found to be in severe  sepsis with lactic acidosis most likely from left lower extremity cellulitis. Inpatient admission with estimate length of stay greater then 48 hours.     Plan:   1. Severe sepsis on admission Secondary to left lower extremity cellulitis.  Blood cultures obtained continue with ceftriaxone. Received 1 L of bolus. Continue IV fluids and repeat lactic acid every 6 hours until resolution    2. Syncopal episode. Most likely due to orthostasis and hypotension in setting of sepsis. Hold ACE inhibitor and hydrochlorothiazide. Resume IV fluids, orthostatic vitals. Obtain echo    3. NSTEMI. Likely demand from sepsis . Trend and obtain. Echo. Resume on asa.     4. 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 OSA.Resume CPAP   6. COPD: No acute exacerbation continue to albuterol or to protocol.  7. Insulin-dependent type 2 diabetes mellitus hyperglycemia. Resume Lantus 10 units at bedtime. Medium sliding scale     DVT prophylaxis: Lovenox  Code Status : Full code. Surrogate is his wife.    Plan was dicussed with patient and wife. All questions answered.   49 min spent.    I personally performed these services  Mindi Slicker  07/12/2016  12:34 AM        This dictation was produced using voice recognition software. Despite concurrent proofreading, please note transcription errors may be present and that these errors may not truly reflect my intent.

## 2016-07-12 NOTE — Progress Notes (Signed)
PHARMACIST PROGRESS NOTE     The following pharmacy services were provided: anti-infective interventions and vancomycin dosing    SUBJECTIVE / OBJECTIVE     Evan [name] is a 71 y.o. Male admitted on 07/11/2016.    I have reviewed available relevant labs, vitals and flowsheets today.    ASSESSMENT       Anti-infective Interventions  Indication(s):  Bacteremia, cellulitis  Relevant microbiology:  1/24 blood 1 of 4: GPC resembling staph; Staph Aureus and MRSA PCR - neg  Current anti-infective(s):  Ceftriaxone 1g I V daily (1/25 - ), Vancomycin IV (1/25 - )  Days of therapy appropriate for indication:  1    PLAN       Anti-infective Interventions  Follow cx/s and potentially discontinue vancomycin if no evidence of oxacillin resistant staph species    Kinetics  Next vancomycin level scheduled for:  1/26 at 1730  Using a CrCl estimation of 60 ml/min (CG adjusted body weight) and dosing vancomycin based off adjusted body weight given BMI >/= 50  Vancomycin 2500 mg IV x1 then 1400 mg IV q12h           Signed by: Cay Schillings, RPH

## 2016-07-12 NOTE — Other (Signed)
IP PT EVALUATION / INITIAL TREATMENT (GENERAL)    Patient:  Evan [name] / [id number] DOB:  August 01, 1945 / 71 y.o. Date:  07/12/2016     Precautions        ASSESSMENT     Summary:  Pt is a 71 y/o gentleman who lives with his wife, and is normally very independent. States he has not been able to walk much the past 2 days due to weakness. Today required Min/Mod A of 2 for sup<>sit via log roll. Sit to stand CGA. Ambulated 10' with FWW and CGA to Min A of 2. Walker often was pushed too far in front of him. Began turning after 5', and when turn almost completed, pt's knees began to buckle. Reported some dizziness as well as weakness.  Was able to stabilize him with Mod A, and get him upright again, to complete gait to bed. Returned to Supine with Mod A of 2. Very high fall risk. Recommend SNF.      Able to be up with Nursing Team (x 2). (To BSC or chair only.)     Activity tolerance: Tolerates 30 min activity with multiple rests  Comments: Feeling of dizziness and weakness in legs.     Rehab Potential: Good     Precaution Awareness: No specific precautions  Deficit Awareness: Decreased awareness of deficits  Correction of Errors: Self-corrects  Safety Judgment: Decreased awareness of bodily and/or fall prevention safety     Consult recommendations  Pt. would benefit from Discharge Planner consult.   Discharge recommendations  Mobility Equipment needs: Front wheeled walker (FWW)   Primary recommendation : Patient would benefit from SNF/Nursing Home/Other Skilled Facility.  Recommended transport method: Wheelchair  Secondary recommendation: Patient would benefit from Home Health PT.  Justification: Currently would need to go to an SNF for safety of pt and his wife.  Very high fall risk.     PLAN     Treatment Plan Frequency    Plan: Ther Ex, Bed mobility training, Transfer training, Gait training  1x (5 days/week).  Frequency will be increased as pt's condition or discharge plan indicates.     Channing Hilton Sinclair will  remain on the Physical Therapy schedule until goals are met, or there has been a change in the Plan of Care.          Treatment Diagnosis Surgery / # Days Post-op (if applicable)    Primary Dx: Sepsis, cellulitis  Treatment Dx: Impaired mobility   /         Pertinent Past Medical Hx:   Past Medical History:   Diagnosis Date   . COPD (chronic obstructive pulmonary disease) (CMS/HCC)    . Diabetes mellitus, type II (CMS/HCC)    . Hypercholesteremia    . Hypertension    . Obesity, morbid (CMS/HCC)         SUBJECTIVE     Pain Level  Current status: Pain does not limit pt's ability to participate in therapy   Hx of current problem  Chief complaint: Increased pain, redness, tenderness LLE, with progressive weakness 2 days prior to admit.  Then syncopal episode, with fall to floor, prompting wife to call 911.    Social history   Lives with: Spouse  Social Activities: Energy manager, Walking/gardening/housework/occasional exercise       Prior LOF  Prior ambulatory status: Walked without assistive device;Walked with single point cane(s)  Home/Community: Pt. was independent in home & community   Home environment  Living situation: [employer]  Home style: One level  Number of stairs to enter home: 1-2   Home equipment  Mobility Equipment: Single point cane(s)     OBJECTIVE     Mental status  General Demeanor: Pleasant;Cooperative  LOC: Drowsy  Orientation: Oriented x 4  Directions: Follows one-step commands  --> Follows one-step commands: With cues  Attention span: Attends, but requires cues to redirect  Memory: Appears intact in social/therapy situations   Medical appliances  Medical Appliances: Oxygen;IV;SPO2 monitor   Strength  Upper extremities: [facility] bilaterally  Lower extremities: WFL bilaterally   ROM  ROM  Upper extremities: WFL bilaterally  Lower extremities: WFL bilaterally     TREATMENT  Bed Mobility/Transfers  Bed mobility       Rolling / logroll  Rolling R: Min Assist;Verbal cues      Transition to sitting  up Sidelying to Sit: Mod Assist   To resting position      Transition to sitting down CGA     Transition to supine Mod Assist;Min Assist (of 2)     Scooting     Transfers      Transition to standing From bed: Min Assist     Method        Gait/Stairs    Mobility - Gait (Level)  Gait (distance): 10 feet  Gait (assist level): CGA;Min Assist;Mod Assist (Varied)  Assistive device: Front wheeled walker (FWW)  Portable devices: IV;Oxygen  --> Oxygen flow rate: 2 L/min  Endurance: Poor  Pace: Slow  Pattern: Downward gaze;Wide BOS;Excessive forward lean;Guarded;Increased upper body tension;Wavering (During turn, pt's legs began to buckle. Able to stabelize pt)  Surface: Level tile    Ther. Ex.       Balance      Training/Education      Safety/Room Set-up   Call button accessible?: Yes  Phone accessible?: Yes  Oxygen reconnected?: No, not disconnected during therapy  Patient mobility in room: Able to be up with Nursing Team (x 2). (To BSC or chair only.)  Position on arrival: In bed  Position on departure (bed): Mattress inflated;4 bedrails up, as per patient status on presentation*     GOALS     Patient/Caregiver goals reviewed and integrated with rehab treatment plan:      Multidisciplinary Problems (Active)        Problem: IP General Goal List - 2    Goal Priority Disciplines Outcome   Bed Mobility     PT    Description:  Pt. to perform bed mobility with min assist.    Transfers     PT    Description:  Pt. to perform all functional transfers with min. assist.    Ambulation - Levels     PT    Description:  Pt. to ambulate with min assist x 50 feet with appropriate assistive device.    Ambulation - Stairs     PT    Description:  Pt. to ascend/descend stairs with min assist, as per discharge environment.    Home Exercise Program     PT    Description:  Pt. to perform basic HEP with min assist.    Caregiver Training     PT    Description:  Patient/caregiver training will be provided, as needed to achieve goals.              Problem: Patient/Family Goal    Goal Priority Disciplines Outcome   Normal Status     PT    Description:  Pt.  wants to return to previous level of function.     Home     PT    Description:  Pt. wants to return to previous living situation.    Mobility     PT    Description:  Patient wants to walk again.                       First session Second session (if applicable)    Start/Stop times: 1334 - 1440 Start/Stop times:   - Stop Time:     Total time: 66 minutes  Total time:   minutes     This note to serve as a Discharge Summary if Ladarius Seubert is discharged from the hospital or from therapy services.     Therapist: Burgess Estelle, PT

## 2016-07-12 NOTE — Progress Notes (Signed)
Daily Progress Note     Name: Evan [name]  DOB: [birth year] 71 y.o.  MRN: [id number]  CSN: [id number]  PCP: Marissa Nestle    Chief complaint: severe sepsis, syncope, cellulitis left leg    Subjective:   Evan [name] was admitted with Fever.  He is a morbidly obese 71 year old male with a history of hypertension, COPD with prior tobacco use, type 2 diabetes mellitus on chronic insulin therapy, hyperlipidemia with obstructive sleep apnea on CPAP, and chronic kidney disease stage III with a baseline creatinine of about 1.3 who presented to the hospital with increasing weakness, fevers, and then syncope. EMS was summoned to the house and he was hypoxic and placed on a nasal cannula and brought to the emergency department where he was found to have sepsis with a lactic acid of 4, acute kidney injury with a creatinine of 1.8, elevated troponin of 0.11, leukocytosis and hypotension. The patient was given a liter of saline, a dose of cefepime and referred for admission.  He had an EKG with a right bundle branch block but I am unable to find any previous EKGs with which to compare this EKG.  The patient was placed on Rocephin initially overnight and it appears that his etiology for his sepsis is the cellulitis of the left lower extremity. The patient does not have a podiatrist and when I examined him and mentioned his ingrown toenail on the left, he didn't seem to notice it. He says he doesn't feel any pain even when I squeeze pus out of it. He says that at no time did he have chest pain but he feels extremely weak and he still is feeling exhausted. His troponin has climbed to 1.94 but he again denies any sensation of chest pain or chest pressure. He does feel a little bit short of breath at times. He denies any previous cardiac disease. He had a fever again last night around 3 in the morning. He denies any nausea or vomiting and says he wants to try to eat.    ROS:  All other systems were reviewed and are negative  except as per HPI.  Objective:   Vital Signs:  BP: 131/50  Pulse: 105  Resp: 21  Temp: 37.4 ?C (99.3 ?F)  SpO2: 97 % (01/25 0738)    Intake/Ouput:    Intake/Output Summary (Last 24 hours) at 07/12/16 1034  Last data filed at 07/12/16 0349   Gross per 24 hour   Intake             1160 ml   Output              100 ml   Net             1060 ml        Exam:  Gen:  Exhausted-appearing, flushed, morbidly obese 71 year old male, pleasant  Respiratory:  No respiratory distress, normal breath sounds, no rales, no wheezing   Cardiovascular:  Normal rate, normal rhythm, no murmur  GI:  Soft, protuberant, decreased bowel sounds, nontender to palpation  Musculoskeletal:  Bilateral lower extremity edema with a noted ingrown toenail on the left. I was able to express some pus out of it. He has skin overgrowth of both feet. No other breakdown on the feet. Erythema of the left leg to just below the knee. No other deformities  Neurologic:  sleepy & oriented x 3, CN 2-12 grossly normal, globally weak but no focal deficits   Skin; erythema  of the left leg as noted above.   Psychiatric:  Speech and behavior appropriate     Recent Labs:  Results for orders placed or performed during the hospital encounter of 07/11/16 (from the past 24 hour(s))   CBC with Auto Differential -STAT    Collection Time: 07/11/16  7:47 PM   Result Value Ref Range    WBC 16.6 (H) 4.8 - 10.8 10*3/?L    RBC 3.72 (L) 4.70 - 6.10 10*6/?L    Hemoglobin 12.5 12.0 - 18.0 g/dL    HCT 44.0 (L) 34.7 - 54.0 %    MCV 100.9 (H) 81.0 - 99.0 fL    MCH 33.6 27.0 - 34.0 pg    MCHC 33.3 32.0 - 36.0 g/dL    RDW 42.5 95.6 - 38.7 %    Platelet Count 170 150 - 400 10*3/?L    MPV 10.8 (H) 7.4 - 10.4 fL    Neutrophils % 93 (H) 35 - 70 %    Bands % 1 0 - 10 %    Lymphocytes % 2 (L) 25 - 45 %    Monocytes % 4 0 - 12 %    Eosinophils % 0 0 - 7 %    Basophils % 0 0 - 2 %    Neutrophils, Absolute 15.4 (H) 1.6 - 7.3 10*3/?L    Bands, Absolute 0.2 0.0 - 1.2 10*3/?L    Lymphocytes, Absolute  0.3 (L) 1.1 - 4.3 10*3/?L    Monocytes, Absolute 0.7 0.0 - 1.2 10*3/?L    Eosinophils, Absolute 0.0 0.0 - 0.7 10*3/?L    Basophils, Absolute 0.0 0.0 - 0.2 10*3/?L    Differential Type Manual Differential     Platelet Estimate Normal Normal    Macrocytes 1+ (A) (none)   Comprehensive Metabolic Panel -STAT    Collection Time: 07/11/16  7:47 PM   Result Value Ref Range    Sodium 137 135 - 143 mmol/L    Potassium 4.8 3.5 - 5.1 mmol/L    Chloride 103 98 - 111 mmol/L    CO2 - Carbon Dioxide 18.9 (L) 21.0 - 31.0 mmol/L    Glucose 306 (H) 80 - 99 mg/dL    BUN 36 (H) 6 - 23 mg/dL    Creatinine 5.64 (H) 0.65 - 1.30 mg/dL    Calcium 9.1 8.6 - 33.2 mg/dL    AST - Aspartate Aminotransferase 22 10 - 50 IU/L    ALT - Alanine Aminotransferase 21 7 - 52 IU/L    Alkaline Phosphatase 60 34 - 104 IU/L    Bilirubin Total 0.5 0.3 - 1.2 mg/dL    Protein Total 7.2 6.0 - 8.0 g/dL    Albumin 3.9 3.5 - 5.0 g/dL    Globulin 3.3 2.2 - 3.7 g/dL    Albumin/Globulin Ratio 1.2 >0.9    Anion Gap 15.1 (H) 3.0 - 11.0 mmol/L    GFR Estimate 37 (L) >=60 mL/min/1.39m*2    GFR Additional Info     Troponin-I ED -STAT    Collection Time: 07/11/16  7:47 PM   Result Value Ref Range    Troponin I 0.11 (HCrit) <=0.04 ng/mL   PT & PTT -STAT    Collection Time: 07/11/16  7:47 PM   Result Value Ref Range    Protime 12.4 10.0 - 13.0 Seconds    APTT 22 (L) 23 - 36 Seconds    INR 1.1 0.9 - 1.2   B-Type Natriuretic Peptide (BNP) -STAT    Collection Time:  07/11/16  7:47 PM   Result Value Ref Range    B-Type Natriuretic Peptide (BNP) 149 (H) 0 - 99 pg/mL   Rapid Influenza A & B by NAAT(Stat) -STAT    Collection Time: 07/11/16  9:17 PM   Result Value Ref Range    Influenza A Influenza A Viral RNA Not Detected Influenza A Viral RNA Not Detected    Influenza B Influenza B Viral RNA Not Detected Influenza B Viral RNA Not Detected   Blood Culture -x 2    Collection Time: 07/11/16  9:37 PM   Result Value Ref Range    Blood Culture Result No Growth to Date    Blood Culture -x  2    Collection Time: 07/11/16  9:37 PM   Result Value Ref Range    Blood Culture Result No Growth to Date    Lactic Acid (ED) -STAT    Collection Time: 07/11/16  9:37 PM   Result Value Ref Range    Lactic Acid ED 4.0 (H) 0.5 - 2.2 mmol/L   Urinalysis with Microscopic -Once    Collection Time: 07/11/16 10:17 PM   Result Value Ref Range    Color, Urine Yellow Yellow, Straw    Clarity, Urine Hazy (A) Clear    Glucose, Urine Negative Negative mg/dL    Bilirubin, Urine Negative Negative    Ketones, Urine Trace (A) Negative mg/dL    Specific Gravity, Urine 1.019 1.003 - 1.030    Blood, Urine 3+ (A) Negative    pH, UA 5.0 5.0 - 7.5    Protein, Urine 2+ (A) Negative mg/dL    Urobilinogen, Urine Normal Normal mg/dL    Nitrite, Urine Negative Negative    Leukocyte Esterase, Urine Negative Negative    Ascorbic Acid, Urine Negative Negative    White Blood Cell Microscopic Numeric 1 <=9 /HPF    RBC, Urine 3 <=5 /HPF    Bacteria, Urine None Seen None Seen /HPF    Squamous Epithelial, Urine <1 <=5 /HPF    Hyaline Casts, Urine 1 <=5 /LPF    Mucous, Urine 1+ 0 /LPF   Lactic Acid, Plasma -STAT    Collection Time: 07/12/16 12:39 AM   Result Value Ref Range    Lactic Acid 2.7 (H) 0.5 - 2.2 mmol/L   CBC with Auto Differential -Daily    Collection Time: 07/12/16  3:39 AM   Result Value Ref Range    WBC 19.6 (H) 4.8 - 10.8 10*3/?L    RBC 3.60 (L) 4.70 - 6.10 10*6/?L    Hemoglobin 12.0 12.0 - 18.0 g/dL    HCT 16.1 (L) 09.6 - 54.0 %    MCV 100.9 (H) 81.0 - 99.0 fL    MCH 33.3 27.0 - 34.0 pg    MCHC 33.0 32.0 - 36.0 g/dL    RDW 04.5 40.9 - 81.1 %    Platelet Count 154 150 - 400 10*3/?L    MPV 10.5 (H) 7.4 - 10.4 fL    Neutrophils % 88 (H) 35 - 70 %    Lymphocytes % 5 (L) 25 - 45 %    Monocytes % 6 0 - 12 %    Eosinophils % 0 0 - 7 %    Basophils % 1 0 - 2 %    Neutrophils, Absolute 17.2 (H) 1.6 - 7.3 10*3/?L    Lymphocytes, Absolute 1.0 (L) 1.1 - 4.3 10*3/?L    Monocytes, Absolute 1.2 0.0 - 1.2 10*3/?L    Eosinophils, Absolute  0.0 0.0 - 0.7  10*3/?L    Basophils, Absolute 0.1 0.0 - 0.2 10*3/?L    Differential Type Automated Differential    Troponin-I -Q6 x2 in 6 hours    Collection Time: 07/12/16  4:07 AM   Result Value Ref Range    Troponin I 1.94 (HCrit) <=0.04 ng/mL   Basic Metabolic Panel -Daily    Collection Time: 07/12/16  4:07 AM   Result Value Ref Range    Sodium 138 135 - 143 mmol/L    Potassium 5.3 (H) 3.5 - 5.1 mmol/L    Chloride 103 98 - 111 mmol/L    CO2 - Carbon Dioxide 21.9 21.0 - 31.0 mmol/L    Glucose 259 (H) 80 - 99 mg/dL    BUN 36 (H) 6 - 23 mg/dL    Creatinine 4.69 (H) 0.65 - 1.30 mg/dL    Calcium 8.9 8.6 - 62.9 mg/dL    Anion Gap 52.8 (H) 3.0 - 11.0 mmol/L    GFR Estimate 35 (L) >=60 mL/min/1.70m*2    GFR Additional Info     POCT Glucose -Next Routine    Collection Time: 07/12/16  5:34 AM   Result Value Ref Range    POC Glucose 309 (H) 80 - 99 mg/dL     Pending Labs     Order Current Status    Blood Culture -x 2 No growth    Blood Culture -x 2         Recent Imaging:  X-ray Chest Pa Or Ap    Result Date: 07/11/2016  IMPRESSION: 1. Mild central vascular congestion. 2. Borderline mild cardiomegaly.     Ct Head Without Contrast    Result Date: 07/11/2016  IMPRESSION: 1.  Negative CT examination of brain for acute abnormality.       Assessment & Plan:        *Fever [R50.9]      Cellulitis [L03.90]      Weakness generalized [R53.1]      Cellulitis of left lower extremity [L03.116]      Syncope and collapse [R55]      Fall, initial encounter [W19.XXXA]      Elevated troponin [R74.8]      Uncontrolled type 2 diabetes mellitus with stage 3 chronic kidney disease, with long-term current use of insulin (CMS/HCC) [E11.22, E11.65, N18.3, Z79.4]      COPD exacerbation (CMS/HCC) [J44.1]      Sepsis due to undetermined organism (CMS/HCC) [A41.9]          This is a 71 y.o. male who presented to the hospital with Fever and the plan is as follows:  1) severe sepsis on presentation with associated syncope, renal failure, non-STEMI secondary to demand  ischemia, secondary to cellulitis of the left lower extremity: His blood pressure is stabilizing but I did increase his IV fluid rate as his lactic acidosis has not yet cleared and he remains quite hyperglycemic.  Despite the fact his lungs sound clear to auscultation, he is coughing a lot so I will leave him on Rocephin in case there is a pulmonary situation going on as well. Otherwise, Ancef likely would suffice. Blood cultures so far remain negative so we will continue to follow. Continue with sepsis protocol  2) syncope: This was due to his sepsis and hypotension, no doubt. We will continue to follow him on telemetry to look for arrhythmias, echocardiogram was done to look for valvular abnormalities, and the patient's blood pressure will be monitored closely. He currently is  too weak to stand for orthostatic hypotension checks. I am holding his ace inhibitor and HCTZ for now as he looked a bit dry and his mild acute on chronic renal failure.   3) non-STEMI: The patient likely has a type II demand ischemia non-STEMI. However, his troponin did come up to 1.9 so he would probably benefit from his outpatient stress test when he is stable. In the meantime we will continue aspirin, beta blockers, statin and follow him closely for evidence of worsening. He has a troponin that will be rechecked again this afternoon and we will continue to address any issues that may arise. He is asymptomatic but given his severe diabetes that is poorly controlled at baseline, it is probable that he could have some silent ischemia and not notice it.  4) type 2 diabetes mellitus, uncontrolled with chronic kidney disease stage III and hyperglycemia: At baseline he is on Victoza, Lantus, sliding scale coverage. He also takes Amaryl and metformin at baseline. This will continue to be held while he is here at this time. We will continue a diabetic diet, I am adding prandial coverage and we may need to increase the Lantus beyond his usual 10  units. I am increasing him to 14 units at this time. This will be addressed on a day-to-day basis  5) ingrown toenail: I asked the nurse to soak his feet as able with either wet towels or interval with mild soap, daily. Then we will place urea cream on his feet twice a day.  He needs a podiatrist if it is true he has none.  6) acute on chronic stage 3 kidney disease: hydrate.     Plan of care was discussed with patient and I tried to call his wife but I was unable to leave a message  32 min  Dr. Marisue Humble in am    0  Letta Kocher [facility]  07/12/2016

## 2016-07-12 NOTE — Progress Notes (Signed)
.  RESPIRATORY ASSESSMENT    SUBJECTIVE: Pt c/o some dyspnea, claims to not use resp meds or O2 at home, does use CPAP at [facility].        OBJECTIVE:   Current Vital Signs:    Pre and post C/S: Diminished, slight improvement post Albuterol   HR:  98      RR:  20              Cough: Non prod   SpO2:   96%         Settings: 4L NC   CXR: Vascular congestion, mild cardiomegaly    ASSESSMENT:  Pt had some subjective improvement from Albuterol med neb.       PLAN: Albuterol neb QID and PRN, CPAP at [facility] ( family to bring his in)       GOAL(S):  Medication:   Decrease work of breathing  Minimize or eliminate adventitious breath sounds  Maximize airflow    CMV/NIV:  Prevent or correct atelectasis  Minimize cardiac workload   Minimize risk of VAP    Supplemental Oxygen:   Maintain SpO2 >   %  Normalize and maintain an adequate SpO2/PaO2 appropriate for patient clinical situation    Per PDP # 102 and MD orders    E.S.  Dione Housekeeper, RRT-NPS

## 2016-07-12 NOTE — Progress Notes (Signed)
Wife notified of the need to transfer to heart center. Dr. Suzi Roots was notifying dtr. Tamela of transfer.

## 2016-07-13 ENCOUNTER — Inpatient Hospital Stay: Admit: 2016-07-13 | Payer: PRIVATE HEALTH INSURANCE | Primary: DO

## 2016-07-13 ENCOUNTER — Inpatient Hospital Stay: Payer: PRIVATE HEALTH INSURANCE | Primary: DO

## 2016-07-13 LAB — CBC WITH AUTO DIFFERENTIAL
Basophils %: 0 % (ref 0–2)
Basophils, Absolute: 0.1 10*3/??L (ref 0.0–0.2)
Eosinophils %: 0 % (ref 0–7)
Eosinophils, Absolute: 0 10*3/??L (ref 0.0–0.7)
HCT: 35.2 % — ABNORMAL LOW (ref 42.0–54.0)
Hemoglobin: 11.6 g/dL — ABNORMAL LOW (ref 12.0–18.0)
Lymphocytes %: 5 % — ABNORMAL LOW (ref 25–45)
Lymphocytes, Absolute: 1 10*3/ÂµL — ABNORMAL LOW (ref 1.1–4.3)
MCH: 33.3 pg (ref 27.0–34.0)
MCHC: 33.1 g/dL (ref 32.0–36.0)
MCV: 100.4 fL — ABNORMAL HIGH (ref 81.0–99.0)
MPV: 11 fL — ABNORMAL HIGH (ref 7.4–10.4)
Monocytes %: 6 % (ref 0–12)
Monocytes, Absolute: 1.2 10*3/ÂµL (ref 0.0–1.2)
Neutrophils %: 89 % — ABNORMAL HIGH (ref 35–70)
Neutrophils, Absolute: 17.8 10*3/ÂµL — ABNORMAL HIGH (ref 1.6–7.3)
Platelet Count: 141 10*3/ÂµL — ABNORMAL LOW (ref 150–400)
RBC: 3.5 10*6/ÂµL — ABNORMAL LOW (ref 4.70–6.10)
RDW: 14.2 % (ref 11.5–14.5)
WBC: 20 10*3/??L — ABNORMAL HIGH (ref 4.8–10.8)

## 2016-07-13 LAB — BASIC METABOLIC PANEL
Anion Gap: 10 mmol/L (ref 3.0–11.0)
BUN: 35 mg/dL — ABNORMAL HIGH (ref 6–23)
CO2 - Carbon Dioxide: 22 mmol/L (ref 21.0–31.0)
Calcium: 8.5 mg/dL — ABNORMAL LOW (ref 8.6–10.3)
Chloride: 103 mmol/L (ref 98–111)
Creatinine: 1.92 mg/dL — ABNORMAL HIGH (ref 0.65–1.30)
GFR Estimate: 35 mL/min/{1.73_m2} — ABNORMAL LOW (ref >=60)
Glucose: 267 mg/dL — ABNORMAL HIGH (ref 80–99)
Potassium: 4.1 mmol/L (ref 3.5–5.1)
Sodium: 135 mmol/L (ref 135–143)

## 2016-07-13 LAB — POCT GLUCOSE
POC Glucose: 251 mg/dL — ABNORMAL HIGH (ref 80–99)
POC Glucose: 253 mg/dL — ABNORMAL HIGH (ref 80–99)
POC Glucose: 268 mg/dL — ABNORMAL HIGH (ref 80–99)
POC Glucose: 283 mg/dL — ABNORMAL HIGH (ref 80–99)
POC Glucose: 296 mg/dL — ABNORMAL HIGH (ref 80–99)

## 2016-07-13 LAB — HEPARIN ANTI XA: Anti-Xa Assay: 1.1 IU/mL

## 2016-07-13 LAB — TROPONIN I
Troponin I: 4.82 ng/mL (ref <=0.04)
Troponin I: 7.77 ng/mL (ref <=0.04)
Troponin I: 6.44 ng/mL (ref <=0.04)

## 2016-07-13 LAB — D-DIMER, QUANTITATIVE: D Dimer, Quantitative: 813 ng/mL FEU (ref 100–500)

## 2016-07-13 MED ORDER — guaiFENesin (MUCINEX) 12 hr tablet 1,200 mg
600 | Freq: Two times a day (BID) | ORAL | Status: DC
Start: 2016-07-13 — End: 2016-07-21
  Administered 2016-07-14 – 2016-07-21 (×15): 600 mg via ORAL

## 2016-07-13 MED ORDER — iohexol (OMNIPAQUE) 350 mg iodine/mL injection 60 mL
350 | Freq: Once | INTRAVENOUS | Status: AC | PRN
Start: 2016-07-13 — End: 2016-07-13
  Administered 2016-07-13: 350 mL via INTRAVENOUS

## 2016-07-13 MED ORDER — regadenoson (LEXISCAN) syringe 0.4 mg
0.4 | Freq: Once | INTRAVENOUS | Status: DC | PRN
Start: 2016-07-13 — End: 2016-07-24
  Administered 2016-07-14: 16:00:00 0.4 mg via INTRAVENOUS

## 2016-07-13 MED ORDER — vancomycin (VANCOCIN) 1,400 mg in dextrose 5 % (D5W) 250 mL IVPB
1000 | Freq: Two times a day (BID) | INTRAVENOUS | Status: DC
Start: 2016-07-13 — End: 2016-07-13
  Administered 2016-07-13 (×2): via INTRAVENOUS

## 2016-07-13 MED ORDER — technetium TC 99M sestamibi (CARDIOLITE) injection 27.5 millicurie
Freq: Once | Status: AC
Start: 2016-07-13 — End: 2016-07-13
  Administered 2016-07-13: 22:00:00 via INTRAVENOUS

## 2016-07-13 MED ORDER — vancomycin (VANCOCIN) 1,400 mg in sodium chloride 0.9 % (NS) 250 mL IVPB
1000 | Freq: Two times a day (BID) | INTRAVENOUS | Status: DC
Start: 2016-07-13 — End: 2016-07-12

## 2016-07-13 MED ORDER — insulin glargine (LANTUS) injection 20 Units
100 | Freq: Every evening | SUBCUTANEOUS | Status: DC
Start: 2016-07-13 — End: 2016-07-14
  Administered 2016-07-14: 06:00:00 100 [IU] via SUBCUTANEOUS

## 2016-07-13 MED ORDER — vancomycin (VANCOCIN) 2,500 mg in sodium chloride 0.9 % (NS) 500 mL IVPB
1000 | Freq: Once | INTRAVENOUS | Status: AC
Start: 2016-07-13 — End: 2016-07-12
  Administered 2016-07-13 (×2): via INTRAVENOUS

## 2016-07-13 MED FILL — ENOXAPARIN 150 MG/ML SUBCUTANEOUS SYRINGE: 150 mg/mL | SUBCUTANEOUS | Qty: 1

## 2016-07-13 MED FILL — ASPIRIN 81 MG TABLET,ENTERIC COATED: 81 mg | ORAL | Qty: 1

## 2016-07-13 MED FILL — ATORVASTATIN 20 MG TABLET: 20 mg | ORAL | Qty: 1

## 2016-07-13 MED FILL — VANCOMYCIN 1,000 MG INTRAVENOUS INJECTION: 1000 mg | INTRAVENOUS | Qty: 1400

## 2016-07-13 MED FILL — LANTUS U-100 INSULIN 100 UNIT/ML SUBCUTANEOUS SOLUTION: 100 [IU]/mL | SUBCUTANEOUS | Qty: 0.14

## 2016-07-13 MED FILL — ACETAMINOPHEN 325 MG TABLET: 325 mg | ORAL | Qty: 2

## 2016-07-13 MED FILL — LEXISCAN 0.4 MG/5 ML INTRAVENOUS SYRINGE: 0.4 | INTRAVENOUS | Qty: 5

## 2016-07-13 MED FILL — LANTUS U-100 INSULIN 100 UNIT/ML SUBCUTANEOUS SOLUTION: 100 [IU]/mL | SUBCUTANEOUS | Qty: 0.2

## 2016-07-13 MED FILL — METOPROLOL TARTRATE 50 MG TABLET: 50 mg | ORAL | Qty: 2

## 2016-07-13 MED FILL — VANCOMYCIN 5 GRAM INTRAVENOUS SOLUTION: 5 g | INTRAVENOUS | Qty: 2500

## 2016-07-13 MED FILL — CEFTRIAXONE 1 GRAM SOLUTION FOR INJECTION: 1 g | INTRAMUSCULAR | Qty: 1

## 2016-07-13 MED FILL — VANCOMYCIN 5 GRAM INTRAVENOUS SOLUTION: 5 g | INTRAVENOUS | Qty: 1400

## 2016-07-13 NOTE — Treatment Summary (Signed)
IP PT TREATMENT NOTE     Patient:  Evan [name] / [id number] DOB:  [birth year] / 71 y.o. Date:  07/13/2016     Precautions  Specific mobility precautions: None     ASSESSMENT     Summary:  Pt needing min A of 2 up to EOB  Sit <==> stand CG-min A 2   Stood to [facility], walked in place X 2 ~ 8 ' each time, fatigued, needed seated rest  BTB  Pt fatigues easily, cellulitis on L LE is painful 3/10     Able to be up with Nursing Team (x 2).     Discharge recommendations  Mobility Equipment needs: Front wheeled walker (FWW)   Primary recommendation : Patient would benefit from SNF/Nursing Home/Other Skilled Facility.  Recommended transport method: Wheelchair  Secondary recommendation: Patient would benefit from Home Health PT.  Justification: Currently would need to go to an SNF for safety of pt and his wife.  Very high fall risk.      PLAN     Treatment Plan Frequency    Plan: Continue per established POC.  Pt. demonstrates limitations which require skilled PT intervention.   .  Frequency will be increased as pt's condition or discharge plan indicates.     Dmarion Hilton Sinclair will remain on the Physical Therapy schedule until goals are met, or there has been a change in the Plan of Care.          Treatment Diagnosis Surgery / # Days Post-op (if applicable)    Primary Dx: Sepsis, cellulitis  Treatment Dx: Impaired mobility   /         SUBJECTIVE   Pt resting in bed, D/W RN, RN present, pt agrees      Pain Level  Current status: Pain does not limit pt's ability to participate in therapy     OBJECTIVE   Strengthening, safe mobility, transfers     Mental status  General Demeanor: Pleasant;Cooperative  LOC: Alert  Orientation: Oriented x 4  --> Follows one-step commands: With cues  Attention span: Appears intact  Memory: Appears intact in social/therapy situations     TREATMENT  Bed Mobility/Transfers  Bed mobility       Rolling / logroll Rolling R: Min Assist      Transition to sitting up Sidelying to Sit: Mod Assist (of 2)      To resting position      Transition to sitting down       Transition to supine Mod Assist (of 2)     Scooting     Transfers      Transition to standing From bed: Min Assist;CGA (of 2)     Method        Gait/Stairs  Mobility - Gait (Level)  Gait (distance): 8 feet (X 2, walking in place)  Gait (assist level): CGA  Assistive device: Front wheeled walker (FWW)  Endurance: poor  Pace: slow  Pattern: Downward gaze;Increased upper body tension;Shuffling;Decreased foot clearance L;Decreased foot clearance R    Ther. Ex.  Therapeutic Exercises  LE exercises: Ankle pumps;Heelslides;Quad sets;Seated long arc quads  Ankle pumps: 10 R, 10 AA L  Heelslides: 5 R, 5 AA L  Quad sets: 10  Seated long arc quads: 5 B   Training/Education      Safety/Room Set-up   Call button accessible?: Yes  Phone accessible?: Yes  Oxygen reconnected?: No, not disconnected during therapy  Patient mobility in room: Able to be up with  Nursing Team (x 2).  Position on arrival: In bed  Position on departure (bed): Bed in lowest position;4 bedrails up, as per patient status on presentation*  Comments: pt weak and fatigues easily     GOALS     Patient/Caregiver goals reviewed and integrated with rehab treatment plan:      Multidisciplinary Problems (Active)        Problem: IP General Goal List - 2    Goal Priority Disciplines Outcome   Bed Mobility     PT    Description:  Pt. to perform bed mobility with min assist.    Transfers     PT    Description:  Pt. to perform all functional transfers with min. assist.    Ambulation - Levels     PT    Description:  Pt. to ambulate with min assist x 50 feet with appropriate assistive device.    Ambulation - Stairs     PT    Description:  Pt. to ascend/descend stairs with min assist, as per discharge environment.    Home Exercise Program     PT    Description:  Pt. to perform basic HEP with min assist.    Caregiver Training     PT    Description:  Patient/caregiver training will be provided, as needed to achieve  goals.             Problem: Patient/Family Goal    Goal Priority Disciplines Outcome   Normal Status     PT    Description:  Pt. wants to return to previous level of function.     Home     PT    Description:  Pt. wants to return to previous living situation.    Mobility     PT    Description:  Patient wants to walk again.                      G-Codes/PSFS/FIM Scores: (if applicable)          First session   Start/Stop times: 1015 - 1100   Total time: 45 minutes      This note to serve as a Discharge Summary if Harout Scheurich is discharged from the hospital or from therapy services.     Therapist:  Heloise Beecham, PTA

## 2016-07-13 NOTE — Progress Notes (Signed)
End of Shift Report     Significant Events Troponin elevated to 7.77 this AM, MD notified and aware.  Acetaminophen given in AM for temp of 38?C     Relevant   Assessment Findings Anticoagulating with Lovenox for NSTEMI  SR with IVCD 80s  IV antibiotics rocephin and vanco  Left lower leg red/warm/scaling. Per orders, feet soaked with warm towels and used urea cream   Very weak - in bariatric bed with air mattress and moved to room with overhead lift assistance. Repositioning  CIWA done, scores 5 or lower - mostly tremors in hands/arms      Patient/Family  Questions/Concerns Talked to daughter in evening about patient condition     DC Barriers IV antibiotics, troponin trending up     Hourly Rounding;  ??  The health care team, RN, CNAll team rounded hourly to;  ??  This ensured a high level of quality, safety checks, and satisfaction.   ??  This ensured patient expectations for excellent care, appropriate goals were set and assessed then updated.  ??  Purposeful Rounding included; Bedside RN handoff report, Medication Pass and education, Pain assessment, Potty check, Position/Confort check, Meal - food and diet check, General Safety of patient/room/bed/rest room/environmental, Plan of Care to include labs,tests,discharge plan and follow up.  RELATE -- Reassure, Explain, Listen, Answer, Take Action, Express appreciation.   ??

## 2016-07-13 NOTE — Progress Notes (Signed)
Starting 2 day stress MIBI today. Rest inject @ 1330. Camera @ 1430. Hold all caff/decaf/chocolate until after stress. Hold all beta blockers, calcium channel blockers, and long lasting nitrates until after stress. NPO after midnight except water and juice ok. Stress portion will be 1/27 time to be determined.

## 2016-07-13 NOTE — Progress Notes (Signed)
PHARMACIST PROGRESS NOTE     The following pharmacy services were provided: anti-infective interventions and vancomycin dosing    SUBJECTIVE / OBJECTIVE     Evan [name] is a 71 y.o. Male admitted on 07/11/2016.    I have reviewed available relevant labs, vitals and flowsheets today.    ASSESSMENT       Anti-infective Interventions  Indication(s):  Bacteremia, cellulitis  Relevant microbiology:  1/24 blood 1 of 4: GPC resembling staph; Staph Aureus and MRSA PCR - neg  Current anti-infective(s):  Ceftriaxone 1g I V daily (1/25 - ), Vancomycin IV (1/25 - )  Days of therapy appropriate for indication:  1    PLAN       Anti-infective Interventions  MRSA screen negative for both MSSA and MRSA but blood cx resembling Staph sp.  This could indicated a Stap epi type species which may be contamination or infectious organism.  In any case, Vanco would likely be treatment of choice for this species and at the very least be kept on board until ID finalized.  Thus, will monitor for cx ID and make recs based on this    Kinetics  Next vancomycin level scheduled for:  1/26 at 1730  Colleague dosed initial Vancomycin last night using a CrCl estimation of 60 ml/min (CG adjusted body weight) and dosing vancomycin based off adjusted body weight given BMI >/= 50  Vancomycin 2500 mg IV x1 then 1400 mg IV q12h  Vanco level returning tonight to be addressed by evening UBP           Signed by: Nolon Bussing. [place], [facility]

## 2016-07-13 NOTE — Progress Notes (Signed)
Evan [name]  Length of Stay: 1    Surgery:     Problem: Principal Problem:    Fever    SNOMED CT(R): FEVER      Active Problems:    Cellulitis    SNOMED CT(R): CELLULITIS        Weakness generalized    SNOMED CT(R): ASTHENIA        Cellulitis of left lower extremity    SNOMED CT(R): CELLULITIS OF LEFT LOWER LIMB        Syncope and collapse    SNOMED CT(R): SYNCOPE AND COLLAPSE        Fall, initial encounter    SNOMED CT(R): FALL        Elevated troponin    SNOMED CT(R): HIGH TROPONIN I LEVEL        Uncontrolled type 2 diabetes mellitus with stage 3 chronic kidney disease, with long-term current use of insulin (CMS/HCC)    SNOMED CT(R): TYPE 2 DIABETES MELLITUS        COPD exacerbation (CMS/HCC)    SNOMED CT(R): ACUTE EXACERBATION OF CHRONIC OBSTRUCTIVE AIRWAYS DISEASE        Sepsis due to undetermined organism (CMS/HCC)    SNOMED CT(R): SEPSIS        Fever      End of Shift Report     Significant Events CTA of chest, 1st day of Cardiac Stress Test     Relevant   Assessment Findings L LE very hot and red, multiple abrasions on back and L UE, otherwise intact and stable      Patient/Family  Questions/Concerns After care when d/c'd from hospital     DC Barriers Sepsis and finishing of Cardiac stress test           Labs:  Lab Results   Component Value Date    NA 135 07/13/2016    K 4.1 07/13/2016    GLU 267 (H) 07/13/2016    BUN 35 (H) 07/13/2016    CREATININES 1.92 (H) 07/13/2016    MG 1.7 07/12/2016    INR 1.1 07/11/2016       I/OLastDay:     Intake/Output Summary (Last 24 hours) at 07/13/16 0700  Last data filed at 07/13/16 0431   Gross per 24 hour   Intake              950 ml   Output              400 ml   Net              550 ml       I/ODrain:  I/O this shift:  In: 2167 [P.O.:240; I.V.:1677; IV Piggyback:250]  Out: 500 [Urine:500]    Last BM:         Hemodynamic:  Last Blood Pressures for the past 9999 hrs (Last 4 readings):   BP   07/13/16 1553 120/80   07/13/16 1100 134/82   07/13/16 0804 132/78      07/13/16 0431 144/72       Chest tube drainage:          @LDACT (x16137,,,1    )@LDACT ([id number]    @LDACT (x20405          Pain:  )Pain Score: Zero    Epidural:         Pacing Wires:            Last Image Result:  No results found for this or any previous visit.  Results  for orders placed during the hospital encounter of 07/11/16   X-ray chest PA or AP    Narrative CHEST 1 VIEW 07/11/2016 8:07 PM    PROVIDED CLINICAL INDICATIONS:  dyspnea      ADDITIONAL CLINICAL HISTORY:  None.    TECHNIQUE:  AP view of the chest was obtained    COMPARISON:  None.    FINDINGS:  There is mild central vascular congestion. No focal areas of   consolidation identified. Borderline mild cardiomegaly is present. The superior   mediastinal silhouette is unremarkable. The bones and soft tissues are   unremarkable.      Impression IMPRESSION:     1. Mild central vascular congestion.  2. Borderline mild cardiomegaly.         Ct Angiogram Pulmonary    Result Date: 07/13/2016  IMPRESSION: 1. No acute pulmonary embolism is identified. No focal infiltrate is seen. 2. There is mild nodular fullness of the RIGHT adrenal gland noted which is likely incidental in the absence of known malignancy.

## 2016-07-13 NOTE — Progress Notes (Signed)
Daily Progress Note   Name: Evan [name]  DOB: [birth year] 71 y.o.  MRN: [id number]  CSN: [id number]  PCP: Remi Deter [name]    Subjective:   Chief Complaint: sepsis left leg cellulitis    Patient is a very pleasant 71 year old male with a history of morbid obesity, hypertension, history of sleep apnea on C Pap, chronic kidney disease stage III, diabetes type 2 on insulin who presented with increasing weakness fevers and a syncopal episode. EMS was called and he was hypoxic and found to be septic with a lactic acid of 4. He had acute kidney injury with a creatinine up to 1.8 and an elevated troponin at 0.11. It is subsequently peaked at 7.7. Despite this he is never complained of chest pain. He has complained of feeling weak and having a cough and shortness of breath. His blood sugars have been elevated above 200. He had an echocardiogram which showed right heart strain but does have a history of obstructive sleep apnea and obesity. He did have an ultrasound of his lower extremities which showed no evidence of DVT. Yesterday the patient's blood culture turned positive with gram-positive cocci he was started on vancomycin but it looks like it was a staph epidermidis so I stopped this morning.    Patient's last fever was 11:30 yesterday morning. He is denying any chest pain and has his usual shortness of breath. No nausea or vomiting.    Objective:   Vital Signs:  BP: 134/82  Pulse: 70  Resp: 22  Temp: 36.8 ?C (98.2 ?F)  SpO2: 93 % (01/26 1100)  Intake/Ouput:    Intake/Output Summary (Last 24 hours) at 07/13/16 1429  Last data filed at 07/13/16 1400   Gross per 24 hour   Intake             3117 ml   Output              900 ml   Net             2217 ml      Exam:  General: Patient is in no acute distress.  Pulmonary: Clear to auscultation bilaterally without wheezes rales or rhonchi.  Cardiovascular: Regular rate and rhythm without murmurs rubs or gallops.   GI: Normal active bowel sounds, soft, non-distended,  nontender.  Musculoskeletal: No clubbing cyanosis does have bilateral lower extremity edema much more so on the left than the right.  Dermatologic: He does have erythema to his left shin with edema and a cellulitic pattern. Ingrown toenail on the left foot.    Recent Labs:  Results for orders placed or performed during the hospital encounter of 07/11/16 (from the past 24 hour(s))   POCT Glucose -Next Routine    Collection Time: 07/12/16  4:04 PM   Result Value Ref Range    POC Glucose 251 (H) 80 - 99 mg/dL   Troponin-I -Timed    Collection Time: 07/12/16  6:17 PM   Result Value Ref Range    Troponin I 4.82 (HCrit) <=0.04 ng/mL   POCT Glucose -Next Routine    Collection Time: 07/12/16  8:25 PM   Result Value Ref Range    POC Glucose 268 (H) 80 - 99 mg/dL   Basic Metabolic Panel -AM Draw    Collection Time: 07/13/16  3:51 AM   Result Value Ref Range    Sodium 135 135 - 143 mmol/L    Potassium 4.1 3.5 - 5.1 mmol/L    Chloride 103 98 -  111 mmol/L    CO2 - Carbon Dioxide 22.0 21.0 - 31.0 mmol/L    Glucose 267 (H) 80 - 99 mg/dL    BUN 35 (H) 6 - 23 mg/dL    Creatinine 1.61 (H) 0.65 - 1.30 mg/dL    Calcium 8.5 (L) 8.6 - 10.3 mg/dL    Anion Gap 09.6 3.0 - 11.0 mmol/L    GFR Estimate 35 (L) >=60 mL/min/1.79m*2    GFR Additional Info     CBC with Auto Differential -AM Draw    Collection Time: 07/13/16  3:51 AM   Result Value Ref Range    WBC 20.0 (H) 4.8 - 10.8 10*3/?L    RBC 3.50 (L) 4.70 - 6.10 10*6/?L    Hemoglobin 11.6 (L) 12.0 - 18.0 g/dL    HCT 04.5 (L) 40.9 - 54.0 %    MCV 100.4 (H) 81.0 - 99.0 fL    MCH 33.3 27.0 - 34.0 pg    MCHC 33.1 32.0 - 36.0 g/dL    RDW 81.1 91.4 - 78.2 %    Platelet Count 141 (L) 150 - 400 10*3/?L    MPV 11.0 (H) 7.4 - 10.4 fL    Neutrophils % 89 (H) 35 - 70 %    Lymphocytes % 5 (L) 25 - 45 %    Monocytes % 6 0 - 12 %    Eosinophils % 0 0 - 7 %    Basophils % 0 0 - 2 %    Neutrophils, Absolute 17.8 (H) 1.6 - 7.3 10*3/?L    Lymphocytes, Absolute 1.0 (L) 1.1 - 4.3 10*3/?L    Monocytes, Absolute  1.2 0.0 - 1.2 10*3/?L    Eosinophils, Absolute 0.0 0.0 - 0.7 10*3/?L    Basophils, Absolute 0.1 0.0 - 0.2 10*3/?L    Differential Type Automated Differential    Troponin-I -Next Routine    Collection Time: 07/13/16  3:51 AM   Result Value Ref Range    Troponin I 7.77 (HCrit) <=0.04 ng/mL   POCT Glucose -Next Routine    Collection Time: 07/13/16  6:15 AM   Result Value Ref Range    POC Glucose 253 (H) 80 - 99 mg/dL   D Dimer, Quantitative -Next Routine    Collection Time: 07/13/16  7:08 AM   Result Value Ref Range    D Dimer, Quantitative 813 (HCrit) 100 - 500 ng/mL FEU   POCT Glucose -Next Routine    Collection Time: 07/13/16 11:37 AM   Result Value Ref Range    POC Glucose 283 (H) 80 - 99 mg/dL   Heparin Anti XA -Timed    Collection Time: 07/13/16 12:06 PM   Result Value Ref Range    Anti-Xa Assay 1.10 IU/mL   Troponin-I -STAT    Collection Time: 07/13/16 12:06 PM   Result Value Ref Range    Troponin I 6.44 (HCrit) <=0.04 ng/mL     Pending Labs     Order Current Status    Blood Culture -x 2 Preliminary result    Blood Culture -x 2 Preliminary result        Recent Imaging:  No results found.    Assessment & Plan:   1: severe sepsis on presentation. Seems out of proportion to what I would expect from his cellulitis but he is improving. He was started on vancomycin which I'm stopping,  his blood culture grew staph epi. He remains on rocephin. He continues to get normal saline at 150 an hourbut has adequate  blood pressure and is eating well I will decrease his IV fluids.  2. Syncope. Unclear etiology. He does have an elevated troponin and may have had a non-ST elevation MI versus demand ischemia. Doppler unremarkable on telemetry. His echocardiogram showed right heart strain. We could not check orthostatic blood pressures yesterday as he was too weak to stand up. His hydrochlorothiazide and ACE inhibitor are on hold.  3. Non-ST elevation MI. His troponin has peaked at 7.7. I will get a stress test tomorrow. I will  start with the rest portion today.he has been started on therapeutic dosing of Lovenox.  4. Elevated troponin. In the setting of no chest pain and right heart strain this could be due to pulmonary embolism. His d-dimer was elevated. His lower extremity Dopplers were negative for DVT. He is on Lovenox as above. I will get a CT pulmonary angiogram.  5. Acute kidney injury on chronic kidney disease stage III. I'm decreasing his IV fluids but will keep him going as he will get a CT scan and likely needs thefluids.  6. Diabetes type 2. His blood pressure has been uncontrolled. I will increase his Lantus.his metformin and Amaryl are on hold.  7. Ingrown toenail. This could be the source of his cellulitis.    LOS 1 days  Total time spent 38 minutes.  Electronically signed by: Iantha Fallen Sanford1/26/20182:29 PM  This dictation was produced using voice recognition software. Despite concurrent proofreading, please note that homonyms and other transcription errors may be present and that these errors may not truly reflect my intent.

## 2016-07-14 ENCOUNTER — Inpatient Hospital Stay: Admit: 2016-07-14 | Payer: PRIVATE HEALTH INSURANCE | Primary: DO

## 2016-07-14 LAB — CBC WITH AUTO DIFFERENTIAL
Basophils %: 1 % (ref 0–2)
Basophils, Absolute: 0.1 10*3/ÂµL (ref 0.0–0.2)
Eosinophils %: 0 % (ref 0–7)
Eosinophils, Absolute: 0.1 10*3/ÂµL (ref 0.0–0.7)
HCT: 32.8 % — ABNORMAL LOW (ref 42.0–54.0)
Hemoglobin: 11 g/dL — ABNORMAL LOW (ref 12.0–18.0)
Lymphocytes %: 6 % — ABNORMAL LOW (ref 25–45)
Lymphocytes, Absolute: 0.9 10*3/ÂµL — ABNORMAL LOW (ref 1.1–4.3)
MCH: 34 pg (ref 27.0–34.0)
MCHC: 33.6 g/dL (ref 32.0–36.0)
MCV: 101 fL — ABNORMAL HIGH (ref 81.0–99.0)
MPV: 11.1 fL — ABNORMAL HIGH (ref 7.4–10.4)
Monocytes %: 7 % (ref 0–12)
Monocytes, Absolute: 1 10*3/??L (ref 0.0–1.2)
Neutrophils %: 86 % — ABNORMAL HIGH (ref 35–70)
Neutrophils, Absolute: 12.6 10*3/ÂµL — ABNORMAL HIGH (ref 1.6–7.3)
Platelet Count: 143 10*3/??L — ABNORMAL LOW (ref 150–400)
RBC: 3.24 10*6/??L — ABNORMAL LOW (ref 4.70–6.10)
RDW: 14.2 % (ref 11.5–14.5)
WBC: 14.6 10*3/??L — ABNORMAL HIGH (ref 4.8–10.8)

## 2016-07-14 LAB — BASIC METABOLIC PANEL
Anion Gap: 12.4 mmol/L — ABNORMAL HIGH (ref 3.0–11.0)
Anion Gap: 9.8 mmol/L (ref 3.0–11.0)
BUN: 48 mg/dL — ABNORMAL HIGH (ref 6–23)
BUN: 58 mg/dL — ABNORMAL HIGH (ref 6–23)
CO2 - Carbon Dioxide: 20.6 mmol/L — ABNORMAL LOW (ref 21.0–31.0)
CO2 - Carbon Dioxide: 22.2 mmol/L (ref 21.0–31.0)
Calcium: 7.7 mg/dL — ABNORMAL LOW (ref 8.6–10.3)
Calcium: 8.1 mg/dL — ABNORMAL LOW (ref 8.6–10.3)
Chloride: 104 mmol/L (ref 98–111)
Chloride: 104 mmol/L (ref 98–111)
Creatinine: 3.69 mg/dL — ABNORMAL HIGH (ref 0.65–1.30)
Creatinine: 4.5 mg/dL — ABNORMAL HIGH (ref 0.65–1.30)
GFR Estimate: 13 mL/min/{1.73_m2} — ABNORMAL LOW (ref >=60)
GFR Estimate: 16 mL/min/{1.73_m2} — ABNORMAL LOW (ref >=60)
Glucose: 230 mg/dL — ABNORMAL HIGH (ref 80–99)
Glucose: 361 mg/dL — ABNORMAL HIGH (ref 80–99)
Potassium: 4.3 mmol/L (ref 3.5–5.1)
Potassium: 4.5 mmol/L (ref 3.5–5.1)
Sodium: 136 mmol/L (ref 135–143)
Sodium: 137 mmol/L (ref 135–143)

## 2016-07-14 LAB — POCT GLUCOSE
POC Glucose: 291 mg/dL — ABNORMAL HIGH (ref 80–99)
POC Glucose: 220 mg/dL — ABNORMAL HIGH (ref 80–99)
POC Glucose: 301 mg/dL — ABNORMAL HIGH (ref 80–99)

## 2016-07-14 MED ORDER — technetium TC 99M sestamibi (CARDIOLITE) injection 27 millicurie
Freq: Once | Status: AC
Start: 2016-07-14 — End: 2016-07-14
  Administered 2016-07-14: 16:00:00 via INTRAVENOUS

## 2016-07-14 MED FILL — ACETAMINOPHEN 325 MG TABLET: 325 mg | ORAL | Qty: 2

## 2016-07-14 MED FILL — MUCINEX 600 MG TABLET, EXTENDED RELEASE: 600 mg | ORAL | Qty: 2

## 2016-07-14 MED FILL — ENOXAPARIN 150 MG/ML SUBCUTANEOUS SYRINGE: 150 mg/mL | SUBCUTANEOUS | Qty: 1

## 2016-07-14 MED FILL — CEFTRIAXONE 1 GRAM SOLUTION FOR INJECTION: 1 g | INTRAMUSCULAR | Qty: 1

## 2016-07-14 MED FILL — ASPIRIN 81 MG TABLET,ENTERIC COATED: 81 mg | ORAL | Qty: 1

## 2016-07-14 MED FILL — METOPROLOL TARTRATE 50 MG TABLET: 50 mg | ORAL | Qty: 2

## 2016-07-14 MED FILL — ATORVASTATIN 20 MG TABLET: 20 mg | ORAL | Qty: 1

## 2016-07-14 NOTE — Other (Signed)
IP PT TREATMENT NOTE (NOT SEEN)    Patient:  Evan [name] DOB:  Nov 03, 1945 / 71 y.o. Date:  07/14/2016     ASSESSMENT     No treatment provided.  Christo Hilton Sinclair was not seen for the following reason:    Fatigue     PLAN     Caliph Borowiak will remain on the Physical Therapy schedule until goals are met, or there has been a change in the Plan of Care. PT will reattempt to visit pt tomorrow 1/28          Treatment Diagnosis Surgery / # Days Post-op (if applicable)    Primary Dx: Sepsis, cellulitis  Treatment Dx: Impaired mobility   /         SUBJECTIVE   PT declines Pt today due to fatigue, pt states he has been up to bedside multiple times and is very tired     OBJECTIVE   NA          Session times   Start/Stop Times:   -     Total Time:   minutes      This note to serve as a Discharge Summary if Giulio Bertino is discharged from the hospital or from therapy services.     Therapist:  Caron Presume, PTA

## 2016-07-14 NOTE — Progress Notes (Signed)
Evan [name]  Length of Stay: 2    Surgery:     Problem: Principal Problem:    Fever    SNOMED CT(R): FEVER      Active Problems:    Cellulitis    SNOMED CT(R): CELLULITIS        Weakness generalized    SNOMED CT(R): ASTHENIA        Cellulitis of left lower extremity    SNOMED CT(R): CELLULITIS OF LEFT LOWER LIMB        Syncope and collapse    SNOMED CT(R): SYNCOPE AND COLLAPSE        Fall, initial encounter    SNOMED CT(R): FALL        Elevated troponin    SNOMED CT(R): HIGH TROPONIN I LEVEL        Uncontrolled type 2 diabetes mellitus with stage 3 chronic kidney disease, with long-term current use of insulin (CMS/HCC)    SNOMED CT(R): TYPE 2 DIABETES MELLITUS        COPD exacerbation (CMS/HCC)    SNOMED CT(R): ACUTE EXACERBATION OF CHRONIC OBSTRUCTIVE AIRWAYS DISEASE        Sepsis due to undetermined organism (CMS/HCC)    SNOMED CT(R): SEPSIS        Fever        End of Shift Report     Significant Events Finished stress test     Relevant   Assessment Findings Increasing CBG's, increasing redness of Left LE (marked)      Patient/Family  Questions/Concerns Increasing CBG's, source of infection, redness of LLE     DC Barriers Sepsis            Labs:  Lab Results   Component Value Date    NA 136 07/14/2016    K 4.3 07/14/2016    GLU 361 (H) 07/14/2016    BUN 58 (H) 07/14/2016    CREATININES 4.50 (H) 07/14/2016    MG 1.7 07/12/2016    INR 1.1 07/11/2016       I/OLastDay:     Intake/Output Summary (Last 24 hours) at 07/14/16 0700  Last data filed at 07/14/16 0437   Gross per 24 hour   Intake             3942 ml   Output              800 ml   Net             3142 ml       I/ODrain:  I/O this shift:  In: 2297 [P.O.:1640; I.V.:657]  Out: 1125 [Urine:1125]    Last BM:  Stool Occurrence: Yes (07/14/16 0849)      Hemodynamic:  Last Blood Pressures for the past 9999 hrs (Last 4 readings):   BP   07/14/16 1501 144/82   07/14/16 1116 142/84   07/14/16 0845 148/80   07/14/16 0800 (!) 152/80       Chest tube drainage:          @LDACT (x16137,,,1    )@LDACT ([id number]    @LDACT (x20405          Pain:  )Pain Score: Zero    Epidural:         Pacing Wires:            Last Image Result:  No results found for this or any previous visit.  Results for orders placed during the hospital encounter of 07/11/16   X-ray chest PA or AP  Narrative CHEST 1 VIEW 07/11/2016 8:07 PM    PROVIDED CLINICAL INDICATIONS:  dyspnea      ADDITIONAL CLINICAL HISTORY:  None.    TECHNIQUE:  AP view of the chest was obtained    COMPARISON:  None.    FINDINGS:  There is mild central vascular congestion. No focal areas of   consolidation identified. Borderline mild cardiomegaly is present. The superior   mediastinal silhouette is unremarkable. The bones and soft tissues are   unremarkable.      Impression IMPRESSION:     1. Mild central vascular congestion.  2. Borderline mild cardiomegaly.         No results found.

## 2016-07-14 NOTE — Progress Notes (Signed)
Nutrition Holiday/Weekend Screening      Pt does not meet weekend/holiday screening criteria. The RD team looks forward to completing a nutrition assessment on this patient. Assessments will be completed during the next two regular business days depending on degree of nutrition risk.      [facility] Policy:  [id number]; Nutritional Screening and Assessment (Laie)

## 2016-07-14 NOTE — Progress Notes (Signed)
Daily Progress Note   Name: Evan [name]  DOB: 11-13-1945 71 y.o.  MRN: [id number]  CSN: [id number]  PCP: Remi Deter [name]    Subjective:   Chief Complaint: sepsis left leg cellulitis    Patient is a very pleasant 71 year old male with a history of morbid obesity, hypertension, history of sleep apnea on C Pap, chronic kidney disease stage III, diabetes type 2 on insulin who presented with increasing weakness fevers and a syncopal episode. EMS was called and he was hypoxic and found to be septic with a lactic acid of 4. He had acute kidney injury with a creatinine up to 1.8 and an elevated troponin at 0.11. It is subsequently peaked at 7.7. Despite this he has never complained of chest pain. He has complained of feeling weak and having a cough and shortness of breath. His blood sugars have been elevated above 200 and we increased his lantus yesterday. He had an echocardiogram which showed right heart strain but does have a history of obstructive sleep apnea and obesity. He did have an ultrasound of his lower extremities which showed no evidence of DVT. January 25 the patient's blood culture turned positive with gram-positive cocci he was started on vancomycin but it looks like it was a staph epidermidis so I stopped it yesterday morning.    Patient's last fever was last night at 1954. His leg is more swollen today but his leukocytosis is improving. Looks like his erythema may be spreading as well. He still is complaining of being very weak and did not work with physical therapy today. His stress test this coming back intermediate probability.    Objective:   Vital Signs:  BP: 144/82  Pulse: 85  Resp: 20  Temp: 36.8 ?C (98.2 ?F)  SpO2: 96 % (01/27 1501-01/27 1601)  Intake/Ouput:    Intake/Output Summary (Last 24 hours) at 07/14/16 1606  Last data filed at 07/14/16 1254   Gross per 24 hour   Intake             2555 ml   Output             1100 ml   Net             1455 ml      Exam:  General: Patient is in no acute  distress.  Pulmonary: Clear to auscultation bilaterally without wheezes rales or rhonchi.  Cardiovascular: Regular rate and rhythm without murmurs rubs or gallops.   GI: Normal active bowel sounds, soft, non-distended, nontender.  Musculoskeletal: No clubbing cyanosis does have increased edema more so on the left than the right.  Dermatologic: Does appear to have slightly more erythema extending down to his foot with some blisters reported on his legs that I did not unwrap.      Recent Labs:  Results for orders placed or performed during the hospital encounter of 07/11/16 (from the past 24 hour(s))   POCT Glucose -Next Routine    Collection Time: 07/13/16  9:27 PM   Result Value Ref Range    POC Glucose 291 (H) 80 - 99 mg/dL   CBC with Auto Differential -AM Draw    Collection Time: 07/14/16  4:20 AM   Result Value Ref Range    WBC 14.6 (H) 4.8 - 10.8 10*3/?L    RBC 3.24 (L) 4.70 - 6.10 10*6/?L    Hemoglobin 11.0 (L) 12.0 - 18.0 g/dL    HCT 64.4 (L) 03.4 - 54.0 %    MCV  101.0 (H) 81.0 - 99.0 fL    MCH 34.0 27.0 - 34.0 pg    MCHC 33.6 32.0 - 36.0 g/dL    RDW 09.8 11.9 - 14.7 %    Platelet Count 143 (L) 150 - 400 10*3/?L    MPV 11.1 (H) 7.4 - 10.4 fL    Neutrophils % 86 (H) 35 - 70 %    Lymphocytes % 6 (L) 25 - 45 %    Monocytes % 7 0 - 12 %    Eosinophils % 0 0 - 7 %    Basophils % 1 0 - 2 %    Neutrophils, Absolute 12.6 (H) 1.6 - 7.3 10*3/?L    Lymphocytes, Absolute 0.9 (L) 1.1 - 4.3 10*3/?L    Monocytes, Absolute 1.0 0.0 - 1.2 10*3/?L    Eosinophils, Absolute 0.1 0.0 - 0.7 10*3/?L    Basophils, Absolute 0.1 0.0 - 0.2 10*3/?L    Differential Type Automated Differential    Basic Metabolic Panel -AM Draw    Collection Time: 07/14/16  4:20 AM   Result Value Ref Range    Sodium 137 135 - 143 mmol/L    Potassium 4.5 3.5 - 5.1 mmol/L    Chloride 104 98 - 111 mmol/L    CO2 - Carbon Dioxide 20.6 (L) 21.0 - 31.0 mmol/L    Glucose 230 (H) 80 - 99 mg/dL    BUN 48 (H) 6 - 23 mg/dL    Creatinine 8.29 (H) 0.65 - 1.30 mg/dL     Calcium 8.1 (L) 8.6 - 10.3 mg/dL    Anion Gap 56.2 (H) 3.0 - 11.0 mmol/L    GFR Estimate 16 (L) >=60 mL/min/1.4m*2    GFR Additional Info     POCT Glucose -Next Routine    Collection Time: 07/14/16  6:29 AM   Result Value Ref Range    POC Glucose 220 (H) 80 - 99 mg/dL   POCT Glucose -Next Routine    Collection Time: 07/14/16 12:10 PM   Result Value Ref Range    POC Glucose 301 (H) 80 - 99 mg/dL     Pending Labs     Order Current Status    Blood Culture -x 2 Preliminary result    Blood Culture -x 2 Preliminary result        Recent Imaging:  No results found.    Assessment & Plan:   1: severe sepsis on presentation. Seems out of proportion to what I would expect from his cellulitis. His blood pressure and sepsis seems better although the cellulitis looks worse. Please see below.  2. Syncope. Unclear etiology. He did have a non-ST elevation MI and this may have caused it. Doppler unremarkable on telemetry. His echocardiogram showed right heart strain.   3. Non-ST elevation MI. His troponin has peaked at 7.7. I did talk to the cardiologist on-call who read his stress test and refer the whole story and he said unless the patient is having chest pain he would medically manage him. Patient is already on an aspirin, beta blocker and statin and he did not recommend long-acting nitrate in the absence of chest pain. I will continue the Lovenox for one more day. I will check a fasting lipid profile tonight. His hydrochlorothiazide and ACE inhibitor are on hold.  4. Lower extremity cellulitis. Likely source was an ingrown toenail. He is a diabetic and I think we need broader coverage than Rocephin as he is not is improving. I will start him on Levaquin  renally dose adjusted to every 48 hours. We will also elevate his leg. I will ask wound care to see him. We will have his cellulitis marked.  5. Acute kidney injury on chronic kidney disease stage III. Creatinine is bumped overnight likely due to contrast from his CT pulmonary  angiogram. We'll continue IV fluids and recheck now and in the morning  6. Diabetes type 2. His blood sugar still remains over 200. I will increase his Lantus again today. His metformin and Amaryl are on hold  7. Ingrown toenail. This could be the source of his cellulitis.    LOS 2 days  Total time spent 35 minutes.  Electronically signed by: Iantha Fallen Sanford1/27/20184:06 PM  This dictation was produced using voice recognition software. Despite concurrent proofreading, please note that homonyms and other transcription errors may be present and that these errors may not truly reflect my intent.

## 2016-07-14 NOTE — Progress Notes (Signed)
End of Shift Report     Significant Events Cellulitis of left lower leg appears worse than night before with redness spreading to the foot  First day of stress test done 1/26, going for day 2 stress test today     Relevant   Assessment Findings Anticoagulating for NSTEMI  SR  IV rocephin  Bari bed  Trop peaked yesterday at 7.77  [place] LLE negative for dvt  CT angio negative for pe      Patient/Family  Questions/Concerns Answered questions about plan of care to daughter at bedside     DC Barriers IV antibiotics, stress test     Hourly Rounding;  ??  The health care team, RN, CNAll team rounded hourly to;  ??  This ensured a high level of quality, safety checks, and satisfaction.   ??  This ensured patient expectations for excellent care, appropriate goals were set and assessed then updated.  ??  Purposeful Rounding included; Bedside RN handoff report, Medication Pass and education, Pain assessment, Potty check, Position/Confort check, Meal - food and diet check, General Safety of patient/room/bed/rest room/environmental, Plan of Care to include labs,tests,discharge plan and follow up.  RELATE -- Reassure, Explain, Listen, Answer, Take Action, Express appreciation.   ??

## 2016-07-14 NOTE — Progress Notes (Signed)
PHARMACIST PROGRESS NOTE     The following pharmacy services were provided: anti-infective interventions and Lovenox monitoring    SUBJECTIVE / OBJECTIVE     Evan [name] is a 71 y.o. Male admitted on 07/11/2016.    Evan [name] is admitted with sepsis/cellulitis. PMH significant for obesity, HTN, CKD stage III and diabetes type II.     I have reviewed available relevant labs, vitals and flowsheets today.  The following are especially relevant:  SCr: 3.69     Anti-Xa level 1/26: 1.1 IU/mL @ 1206  Lovenox administration on 1/26: 0116, 0830     ASSESSMENT       Anti-infective Interventions  Indication(s):  Cellulitis  Relevant microbiology:  1/24 blood 1 of 4: GPC resembling staph; Staph Aureus and MRSA PCR - neg  Current anti-infective(s):  Ceftriaxone 1g IV daily (1/25 - )  Relevant previous anti-infective(s):  Vancomycin IV (1/25 - 1/26 )  Days of therapy appropriate for indication:  3    Other Pharmacy Services (Lovenox monitoring)   Anti-Xa level returned slightly above range, no adjustment was made yesterday. Of note, there was a dose administered around 0100 1/26 and then again at 0830, which may have impacted the lab. Patient is experiencing acute kidney impairment today and further monitoring may be warranted if impairment does not improve.     PLAN       Anti-infective Interventions  Continue with current management. Labs improving, however per RN documentation, appearance of cellulitis is not improved. May consider increasing dose of ceftriaxone to 2g.     Other Pharmacy Services (Lovenox monitoring)   Recommend further monitoring of renal function and Anti-Xa level tomorrow if acute injury persists to assess for over-anticoagulation with Lovenox.          Signed by: Yolonda Kida, RPH

## 2016-07-15 LAB — CBC WITH AUTO DIFFERENTIAL
Basophils %: 0 % (ref 0–2)
Basophils, Absolute: 0 10*3/??L (ref 0.0–0.2)
Eosinophils %: 1 % (ref 0–7)
Eosinophils, Absolute: 0.1 10*3/ÂµL (ref 0.0–0.7)
HCT: 32.6 % — ABNORMAL LOW (ref 42.0–54.0)
Hemoglobin: 11 g/dL — ABNORMAL LOW (ref 12.0–18.0)
Lymphocytes %: 8 % — ABNORMAL LOW (ref 25–45)
Lymphocytes, Absolute: 0.8 10*3/ÂµL — ABNORMAL LOW (ref 1.1–4.3)
MCH: 33.9 pg (ref 27.0–34.0)
MCHC: 33.9 g/dL (ref 32.0–36.0)
MCV: 100 fL — ABNORMAL HIGH (ref 81.0–99.0)
MPV: 10.1 fL (ref 7.4–10.4)
Monocytes %: 8 % (ref 0–12)
Monocytes, Absolute: 0.9 10*3/??L (ref 0.0–1.2)
Neutrophils %: 82 % — ABNORMAL HIGH (ref 35–70)
Neutrophils, Absolute: 8.5 10*3/ÂµL — ABNORMAL HIGH (ref 1.6–7.3)
Platelet Count: 152 10*3/??L (ref 150–400)
RBC: 3.26 10*6/ÂµL — ABNORMAL LOW (ref 4.70–6.10)
RDW: 14 % (ref 11.5–14.5)
WBC: 10.4 10*3/ÂµL (ref 4.8–10.8)

## 2016-07-15 LAB — BASIC METABOLIC PANEL
Anion Gap: 9.3 mmol/L (ref 3.0–11.0)
BUN: 62 mg/dL — ABNORMAL HIGH (ref 6–23)
CO2 - Carbon Dioxide: 20.7 mmol/L — ABNORMAL LOW (ref 21.0–31.0)
Calcium: 7.8 mg/dL — ABNORMAL LOW (ref 8.6–10.3)
Chloride: 107 mmol/L (ref 98–111)
Creatinine: 4.48 mg/dL — ABNORMAL HIGH (ref 0.65–1.30)
GFR Estimate: 13 mL/min/{1.73_m2} — ABNORMAL LOW (ref >=60)
Glucose: 232 mg/dL — ABNORMAL HIGH (ref 80–99)
Potassium: 4.3 mmol/L (ref 3.5–5.1)
Sodium: 137 mmol/L (ref 135–143)

## 2016-07-15 LAB — POCT GLUCOSE
POC Glucose: 185 mg/dL — ABNORMAL HIGH (ref 80–99)
POC Glucose: 274 mg/dL — ABNORMAL HIGH (ref 80–99)
POC Glucose: 326 mg/dL — ABNORMAL HIGH (ref 80–99)
POC Glucose: 397 mg/dL — ABNORMAL HIGH (ref 80–99)

## 2016-07-15 LAB — HEPARIN ANTI XA: Anti-Xa Assay: 1 IU/mL

## 2016-07-15 MED ORDER — insulin glargine (LANTUS) injection 26 Units
100 | Freq: Every evening | SUBCUTANEOUS | Status: DC
Start: 2016-07-15 — End: 2016-07-16
  Administered 2016-07-15 – 2016-07-16 (×2): 100 [IU] via SUBCUTANEOUS

## 2016-07-15 MED ORDER — metoprolol tartrate (LOPRESSOR) tablet 150 mg
50 | Freq: Two times a day (BID) | ORAL | Status: DC
Start: 2016-07-15 — End: 2016-07-14

## 2016-07-15 MED ORDER — levofloxacin (LEVAQUIN) 750 mg in D5W IVPB Premix
750 | INTRAVENOUS | Status: DC
Start: 2016-07-15 — End: 2016-07-20
  Administered 2016-07-15: 04:00:00 750150 mg via INTRAVENOUS
  Administered 2016-07-15 – 2016-07-17 (×2): 750 mg via INTRAVENOUS
  Administered 2016-07-17 – 2016-07-19 (×2): 750150 mg via INTRAVENOUS
  Administered 2016-07-19: 01:00:00 750 mg via INTRAVENOUS

## 2016-07-15 MED ORDER — metoprolol tartrate (LOPRESSOR) tablet 150 mg
50 | Freq: Two times a day (BID) | ORAL | Status: DC
Start: 2016-07-15 — End: 2016-07-30
  Administered 2016-07-15 – 2016-07-30 (×30): 50 mg via ORAL

## 2016-07-15 MED ORDER — enoxaparin (LOVENOX) syringe 40 mg
40 | Freq: Every day | SUBCUTANEOUS | Status: DC
Start: 2016-07-15 — End: 2016-07-17
  Administered 2016-07-16 – 2016-07-17 (×2): 40 mg via SUBCUTANEOUS

## 2016-07-15 MED FILL — ENOXAPARIN 150 MG/ML SUBCUTANEOUS SYRINGE: 150 mg/mL | SUBCUTANEOUS | Qty: 1

## 2016-07-15 MED FILL — MUCINEX 600 MG TABLET, EXTENDED RELEASE: 600 mg | ORAL | Qty: 2

## 2016-07-15 MED FILL — METOPROLOL TARTRATE 50 MG TABLET: 50 mg | ORAL | Qty: 3

## 2016-07-15 MED FILL — ASPIRIN 81 MG TABLET,ENTERIC COATED: 81 mg | ORAL | Qty: 1

## 2016-07-15 MED FILL — LEVOFLOXACIN IN D5W 750 MG/150 ML IVPB PREMIX: 750 mg/150 mL | INTRAVENOUS | Qty: 150

## 2016-07-15 MED FILL — ACETAMINOPHEN 325 MG TABLET: 325 mg | ORAL | Qty: 2

## 2016-07-15 MED FILL — ATORVASTATIN 20 MG TABLET: 20 mg | ORAL | Qty: 1

## 2016-07-15 MED FILL — MELATONIN 3 MG TABLET: 3 mg | ORAL | Qty: 1

## 2016-07-15 MED FILL — LANTUS U-100 INSULIN 100 UNIT/ML SUBCUTANEOUS SOLUTION: 100 [IU]/mL | SUBCUTANEOUS | Qty: 0.2

## 2016-07-15 MED FILL — LANTUS U-100 INSULIN 100 UNIT/ML SUBCUTANEOUS SOLUTION: 100 [IU]/mL | SUBCUTANEOUS | Qty: 0.26

## 2016-07-15 NOTE — Progress Notes (Addendum)
End of Shift Report     Significant Events 2 day stress test completed yesterday - metoprolol increased. Patient has not had any chest pain  IV continuous NS at 100/hr - 24 net intake up 1870 mL     Relevant   Assessment Findings Cellulitis of left lower leg hot, painful, now with large blister on shin area. Some redness appearing on pedal area of right foot as well. Wound care consult has been ordered  IV Levaquin for cellulitis.    Blood sugars increasing over time up to 397 last night, Lantus dose currently 26 units. NovoLOG four times daily with carb coverage    Patient still weak in lower extremites with tremors in upper extremities. Discussed with him importance of working with PT during the day        Patient/Family  Questions/Concerns He would like to get up to sit, am working on finding a bariatric reclining chair. Currently in lift room and bari bed     DC Barriers Deconditioning, IV antibiotics, worsening cellulitis of leg, blood glucose control     Hourly Rounding;  ??  The health care team, RN, CNAll team rounded hourly to;  ??  This ensured a high level of quality, safety checks, and satisfaction.   ??  This ensured patient expectations for excellent care, appropriate goals were set and assessed then updated.  ??  Purposeful Rounding included; Bedside RN handoff report, Medication Pass and education, Pain assessment, Potty check, Position/Confort check, Meal - food and diet check, General Safety of patient/room/bed/rest room/environmental, Plan of Care to include labs,tests,discharge plan and follow up.  RELATE -- Reassure, Explain, Listen, Answer, Take Action, Express appreciation.   ??

## 2016-07-15 NOTE — Progress Notes (Signed)
Daily Progress Note   Name: Evan [name]  DOB: Apr 10, 1946 71 y.o.  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 EMS was called and he was hypoxic and found to be septic with a lactic acid of 4. He had acute kidney injury with a creatinine up to 1.8 and an elevated troponin at 0.11. It is subsequently peaked at 7.7. Despite this he has never complained of chest pain. He has complained of feeling weak and having a cough and shortness of breath. He had a stress test which showed moderate probability of underlying coronary artery disease. They recommended medical management unless he had ongoing pain. His blood sugars have been elevated above 200 and we increased his lantus yesterday. He had an echocardiogram which showed right heart strain but does have a history of obstructive sleep apnea and obesity. He did have an ultrasound of his lower extremities which showed no evidence of DVT. A CT pulmonary angiogram showed no evidence of pulmonary emboli. Unfortunately it does look like he developed a contrast-induced nephropathy. It does appear that his creatinine has plateaued and he is making better urine so I anticipate it will start improving in the next day or 2. January 25 the patient's blood culture turned positive with gram-positive cocci he was started on vancomycin but it looks like it was a staph epidermidis so it was stopped.    Patient's last fever was January 26 at 1954. Yesterday his leg was more swollen and his rash looked worse. It started leaking. Thinking he probably had a polymicrobial infection with his underlying diabetes we switched him from Rocephin to Levaquin. His  leukocytosis has resolved and I think his wound looked a little bit better today. He definitely says he is feeling better. He is denying any chest pain, shortness of breath, nausea or vomiting he worked with physical therapy today in the low and able to ambulate he did fairly well.    Objective:   Vital Signs:  BP: 142/76  Pulse: 66  Resp: 18  Temp: 36.8 ?C (98.2 ?F)  SpO2: 98 % (01/28 1100)  Intake/Ouput:    Intake/Output Summary (Last 24 hours) at 07/15/16 1243  Last data filed at 07/15/16 1200   Gross per 24 hour   Intake             3812 ml   Output             1825 ml   Net             1987 ml      Exam:  General: Patient is in no acute distress.  Pulmonary: Clear to auscultation bilaterally without wheezes rales or rhonchi.  Cardiovascular: Regular rate and rhythm without murmurs rubs or gallops.   GI: Normal active bowel sounds, soft, non-distended, nontender.  Musculoskeletal: No clubbing cyanosis does have 2+ bilateral lower extremity pitting and scrotal edema.        Recent Labs:  Results for orders placed or performed during the hospital encounter of 07/11/16 (from the past 24 hour(s))   Basic Metabolic Panel -Now and AM    Collection Time: 07/14/16  3:57 PM  Result Value Ref Range    Sodium 136 135 - 143 mmol/L    Potassium 4.3 3.5 - 5.1 mmol/L    Chloride 104 98 - 111 mmol/L    CO2 - Carbon Dioxide 22.2 21.0 - 31.0 mmol/L    Glucose 361 (H) 80 - 99 mg/dL    BUN 58 (H) 6 - 23 mg/dL    Creatinine 1.61 (H) 0.65 - 1.30 mg/dL    Calcium 7.7 (L) 8.6 - 10.3 mg/dL    Anion Gap 9.8 3.0 - 11.0 mmol/L    GFR Estimate 13 (L) >=60 mL/min/1.3m*2    GFR Additional Info     POCT Glucose -Next Routine    Collection Time: 07/14/16  4:20 PM   Result Value Ref Range    POC Glucose 326 (H) 80 - 99 mg/dL   POCT Glucose -Next Routine    Collection Time: 07/14/16  8:41 PM   Result Value Ref Range    POC Glucose 397 (H) 80 - 99 mg/dL   Basic Metabolic Panel -Now and AM    Collection Time: 07/15/16  4:09 AM   Result Value Ref  Range    Sodium 137 135 - 143 mmol/L    Potassium 4.3 3.5 - 5.1 mmol/L    Chloride 107 98 - 111 mmol/L    CO2 - Carbon Dioxide 20.7 (L) 21.0 - 31.0 mmol/L    Glucose 232 (H) 80 - 99 mg/dL    BUN 62 (H) 6 - 23 mg/dL    Creatinine 0.96 (H) 0.65 - 1.30 mg/dL    Calcium 7.8 (L) 8.6 - 10.3 mg/dL    Anion Gap 9.3 3.0 - 11.0 mmol/L    GFR Estimate 13 (L) >=60 mL/min/1.38m*2    GFR Additional Info     CBC with Auto Differential -AM Draw    Collection Time: 07/15/16  4:09 AM   Result Value Ref Range    WBC 10.4 4.8 - 10.8 10*3/?L    RBC 3.26 (L) 4.70 - 6.10 10*6/?L    Hemoglobin 11.0 (L) 12.0 - 18.0 g/dL    HCT 04.5 (L) 40.9 - 54.0 %    MCV 100.0 (H) 81.0 - 99.0 fL    MCH 33.9 27.0 - 34.0 pg    MCHC 33.9 32.0 - 36.0 g/dL    RDW 81.1 91.4 - 78.2 %    Platelet Count 152 150 - 400 10*3/?L    MPV 10.1 7.4 - 10.4 fL    Neutrophils % 82 (H) 35 - 70 %    Lymphocytes % 8 (L) 25 - 45 %    Monocytes % 8 0 - 12 %    Eosinophils % 1 0 - 7 %    Basophils % 0 0 - 2 %    Neutrophils, Absolute 8.5 (H) 1.6 - 7.3 10*3/?L    Lymphocytes, Absolute 0.8 (L) 1.1 - 4.3 10*3/?L    Monocytes, Absolute 0.9 0.0 - 1.2 10*3/?L    Eosinophils, Absolute 0.1 0.0 - 0.7 10*3/?L    Basophils, Absolute 0.0 0.0 - 0.2 10*3/?L    Differential Type Automated Differential    POCT Glucose -Next Routine    Collection Time: 07/15/16  5:47 AM   Result Value Ref Range    POC Glucose 185 (H) 80 - 99 mg/dL   POCT Glucose -Next Routine    Collection Time: 07/15/16 11:51 AM   Result Value Ref Range    POC Glucose 274 (H) 80 - 99 mg/dL  Pending Labs     Order Current Status    Blood Culture -x 2 Preliminary result    Blood Culture -x 2 Preliminary result        Recent Imaging:  No results found.    Assessment & Plan:   1: severe sepsis on presentation. Seems out of proportion to what I would expect from his cellulitis. His blood pressure and sepsis seems better And I think his cellulitis is finally turning around.  2. Syncope. Unclear etiology. He did have a non-ST  elevation MI and this may have caused it. Doppler unremarkable on telemetry. His echocardiogram showed right heart strain.   3. Non-ST elevation MI. His troponin has peaked at 7.7. I did talk to the cardiologist on-call who read his stress test and refer the whole story and he said unless the patient is having chest pain he would medically manage him. Patient is already on an aspirin, beta blocker, which increased yesterday, and statin and he did not recommend long-acting nitrate in the absence of chest pain. He has been on Lovenox which I think we can stop. I will check a fasting lipid profile tonight. His hydrochlorothiazide and ACE inhibitor are on hold due to his renal failure.  4. Lower extremity cellulitis. Likely source was an ingrown toenail. He is a diabetic and I think we need broader coverage than Rocephin as he is not is improving. I started him on Levaquin renally dose adjusted to every 48 hours. We are also elevating his leg. I will ask wound care to see him. Did look a bit little better today.  5. Acute kidney injury on chronic kidney disease stage III. Creatinine is bumped overnight likely due to contrast from his CT pulmonary angiogram. We'll continue IV fluids and recheck now and in the morning  6. Diabetes type 2. His blood sugar still remains over 200. I will increase his Lantus again today. His metformin and Amaryl are on hold  7. Ingrown toenail. This could be the source of his cellulitis.  8. Edema. He is up over 5 L since admission. Due to his acute kidney injury I will hold off Lasix for one more day and likely will start tomorrow.    LOS 3 days  Total time spent 32 minutes.  Electronically signed by: Iantha Fallen Sanford1/28/201812:43 PM  This dictation was produced using voice recognition software. Despite concurrent proofreading, please note that homonyms and other transcription errors may be present and that these errors may not truly reflect my intent.

## 2016-07-15 NOTE — Treatment Summary (Signed)
IP PT TREATMENT NOTE     Patient:  Evan [name] / 3361/3361-01 DOB:  [birth year] / 71 y.o. Date:  07/15/2016     Precautions  Specific mobility precautions: None  Other: Fall     ASSESSMENT     Summary:  Tolerated mobility training well. Easily fatigues but stood EOB, marching in place x30 steps and transferred to chair without incident or knees buckling. Remains deconditioned and LE blister draining a lot of serous liquid. Nsg aware. To monitor for need for SNF vs home health at discharge pending improvement.      Able to be up with Nursing Team (x 1).;Able to be up with Nursing Team (x 2).     Activity tolerance: Tolerates 30 min activity with multiple rests     Precaution Awareness: No specific precautions  Deficit Awareness: Fully aware of deficits  Correction of Errors: Self-corrects with cues  Safety Judgment: Good awareness of safety     Consult recommendations      Discharge recommendations  Mobility Equipment needs: Front wheeled walker (FWW) (bariatric)   Primary recommendation : Patient would benefit from Home Health PT.  Recommended transport method: Wheelchair  Secondary recommendation: Patient would benefit from SNF/Nursing Home/Other Skilled Facility.  Recommended transport method: Wheelchair  Justification: Home health vs SNF pending mobility progress      PLAN     Treatment Plan Frequency    Plan: Continue per established POC.  Pt. demonstrates limitations which require skilled PT intervention.  1x (5 days/week).  Frequency will be increased as pt's condition or discharge plan indicates.     Evan [name] will remain on the Physical Therapy schedule until goals are met, or there has been a change in the Plan of Care.          Treatment Diagnosis Surgery / # Days Post-op (if applicable)    Primary Dx: Sepsis, cellulitis  Treatment Dx: Impaired mobility   /         SUBJECTIVE   Motivated to get up to chair.      Pain Level  Current status: Pain somewhat limits pt's ability to participate in  therapy  Pain at start of session: 0 - No Pain  Pain at end of session: 0 - No Pain  Pain at rest: 0 - No Pain  Pain with activity: 2  Pain location: Leg     OBJECTIVE   Seen for EOB/OOB activities; APs, marching bedside and transfer to chair.      Mental status  General Demeanor: Pleasant;Cooperative  LOC: Alert  Orientation: Oriented x 4  Directions: Follows multi-step commands  --> Follows multi-step commands: Consistently  --> Follows one-step commands: Consistently  Attention span: Appears intact  Memory: Appears intact in social/therapy situations   Medical appliances  Medical Appliances: IV;SPO2 monitor;HR monitor   Vitals  Vitals  1: Position;Activity;HR;SPO2  Position: Sitting  Activity: During activity;Immediate Post-Exercise  HR: 70-80s  SPO2: >94% on RA    Other  Observation  Narrative: Very large, draining blister to L lower leg - mid-shin      TREATMENT  Bed Mobility/Transfers  Bed mobility       Rolling / logroll         Transition to sitting up Supine to Sit: Min Assist;Mod Assist   To resting position      Transition to sitting down CGA     Transition to supine       Scooting     Transfers  Transition to standing From bed: Min Assist (bed elevated for ease of standing)     Method Stand pivot: CGA      Gait/Stairs  Mobility - Gait (Level)  Gait (distance):  (marching in place bedside for safety)    Ther. Ex.   APs - 15 reps    Balance  Balance - Basic  Dynamic standing balance: Assist level;Steps in place (#)  Assist level with weight shifts: SBA;CGA  Steps in place (#): 3 sets of 10   Training/Education  Trainee(s): Primary Learner  Primary Learner: Patient  Primary Learner - Barriers to Learning?: Yes  Primary Learner - Specific barriers: Physical  Training provided: Marching in place; transfer training  Response to training: Receptive   Safety/Room Set-up   Call button accessible?: Yes  Phone accessible?: Yes  Oxygen reconnected?: No, on room air  Patient mobility in room: Able to be up with  Nursing Team (x 1).;Able to be up with Nursing Team (x 2).  Position on arrival: In bed  Position on departure (upright): In recliner chair     GOALS     Patient/Caregiver goals reviewed and integrated with rehab treatment plan:      Multidisciplinary Problems (Active)        Problem: IP General Goal List - 2    Goal Priority Disciplines Outcome   Bed Mobility     PT    Description:  Pt. to perform bed mobility with min assist.    Transfers     PT    Description:  Pt. to perform all functional transfers with min. assist.    Ambulation - Levels     PT    Description:  Pt. to ambulate with min assist x 50 feet with appropriate assistive device.    Ambulation - Stairs     PT    Description:  Pt. to ascend/descend stairs with min assist, as per discharge environment.    Home Exercise Program     PT    Description:  Pt. to perform basic HEP with min assist.    Caregiver Training     PT    Description:  Patient/caregiver training will be provided, as needed to achieve goals.             Problem: Patient/Family Goal    Goal Priority Disciplines Outcome   Normal Status     PT    Description:  Pt. wants to return to previous level of function.     Home     PT    Description:  Pt. wants to return to previous living situation.    Mobility     PT    Description:  Patient wants to walk again.                        G-Codes/PSFS/FIM Scores: (if applicable)          First session Second session (if applicable)    Start/Stop times: 1118 - 1155 Start/Stop times:   - Stop Time:     Total time: 37 minutes  Total time:   minutes     This note to serve as a Discharge Summary if Evan [name] is discharged from the hospital or from therapy services.     Therapist:  Juliann Pares, PT

## 2016-07-15 NOTE — Progress Notes (Signed)
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  H&P reviewed.       Hospital Problem List as of 07/15/2016       Hospital    Fever    Cellulitis    Weakness generalized    Cellulitis of left lower extremity    Syncope and collapse    Fall, initial encounter    Elevated troponin    Uncontrolled type 2 diabetes mellitus with stage 3 chronic kidney disease, with long-term current use of insulin (CMS/HCC)    COPD exacerbation (CMS/HCC)    Sepsis due to undetermined organism (CMS/HCC)                   Allergies: No Known Allergies    Braden Scale:   Sensory Perceptions: Slightly limited  Moisture: Occasionally moist  Activity: Chairfast  Mobility: Very limited  Nutrition: Adequate  Friction and Shear: Potential problem  Braden Scale Score: 15  On appropriate surface? Yes, compella bed    Labs:   Lab Results   Component Value Date    WHITEBLOODCE 10.4 07/15/2016    WHITEBLOODCE 14.6 (H) 07/14/2016    WHITEBLOODCE 20.0 (H) 07/13/2016    HGB 11.0 (L) 07/15/2016    HGB 11.0 (L) 07/14/2016    HGB 11.6 (L) 07/13/2016    HCT 32.6 (L) 07/15/2016    HCT 32.8 (L) 07/14/2016    HCT 35.2 (L) 07/13/2016    MCV 100.0 (H) 07/15/2016    MCV 101.0 (H) 07/14/2016    MCV 100.4 (H) 07/13/2016    LABPLAT 152 07/15/2016    LABPLAT 143 (L) 07/14/2016    LABPLAT 141 (L) 07/13/2016       No results for input(s): SEDRATE in the last 72  hours.  No results for input(s): ALBUMIN in the last 72 hours.  No results for input(s): CRP in the last 72 hours.  Body mass index is 55.08 kg/m?[name]  Nutritional Status:  low albumin, anemic and High BMI    Neuro Assessment: Pt alert and cooperative with care    Fall Risk: Score: 70  Fall Risk: High  Precautions Observed        07/15/16 1620   Wounds and Other Ulcers 07/15/16  Leg Left;Anterior   Date First Assessed: 07/15/16   Wound Type: (c)   Location: Leg  Wound Location Orientation: Left;Anterior  Pre-existing: Yes   State of Healing Non-healing   Wound Assessment Pink  (Blister prior to mechanical debridemetn)   Length (longest length head to toe) 4   Width (widest width perpendicular to length) 4.5   Depth (deepest part of visible wound) 0.1   Edges / Margins attached;not attached   Peri-wound Assessment erythemic  (edematous)   Drainage Amount Large   Drainage Description Serous   Closure None   Treatments Cleansed;Debrided: Mechanical   Dressing ABD  (Aquacel AG)   Dressing Status Dressing Changed  Isolation Status:  standard precautions observed    Pain - Prior: 0  During: 3  After: 0  Pain when mechaincally debriding the blister  Family Present: none    Last dressing change: open to air    Debridement:   Mechanical: gauze was used to debride devitalized epidermis from LLE wound.     Treatment/Rationale/Impression: Pt has been on antibiotics and erythema doesn't appear to be resolving; therefore I took a wound culture using the [employer].   I removed the blister skin to release the fluid trapped there which will allow the silver in the Aquacel AG to fight microbes at the wound level. Aquacel AG will also keep woundbed moist and wick drainage to cover dressing of an ABD.    Pt would benefit from some compression therapy with the use of tubigrips however, I will wait for culture results to ensure that antibiotic is targeting the infection and verify that pt has addequate arterial  circulation to withstand mod compression.    Lift Assist: None needed  Care teamed with: Brayton Caves, RN       Education:    Who: patient and care provider      What reviewed: S&S of infection and Wound Care    Pt understanding: Will need reinforcements  Coordination of Care with: patient and nurse    Impression: See above.    Recommendations:   Dressing changes to be done qd by floor nurses.  Wound Care team to follow-up in 1-3 days.      Billable Items: Aquacel AG Extra 4x4, ABD

## 2016-07-15 NOTE — Progress Notes (Signed)
RESPIRATORY ASSESSMENT    SUBJECTIVE:   Patient awake and alert. Patient states he does not use any home respiratory medications. Patient states he not use any home O2 but does use a home CPAP at night. Patient states he is not a current smoker. Patient denies any SOB or distress at this time. Patient states that last night the breathing treatment made him feel funny. Patient compliant with DC'ing scheduled treatments but would like to keep a PRN just in case.     OBJECTIVE:   Current Vital Signs:    Pre and post C/S: Clear and diminished throughout. Treatment not indicated at this time.    HR: 75    RR: 18                Cough: Non-productive at this time.   SpO2: 97% on home CPAP.    ASSESSMENT:    Patient resting comfortably on home CPAP. SpO2 and b/s both very good. Patient does not require scheduled MN treatments at this time. No SOB or distress noted at this time.     PLAN:   Albuterol MN PRN.     GOAL(S):  Medication:  Decrease work of breathing, when persists.  Maximize airflow, if needed.  Metered Dose Inhaler and Dry Powder inhaler conversions to nebulized medication policy # [id number] used    Supplemental Oxygen:   Maintain SpO2 > 90%  Normalize and maintain an adequate SpO2/PaO2 appropriate for patient clincial  situation    Per PDP # 102 and MD orders    E.S.  Loura Pardon, RCP.

## 2016-07-15 NOTE — Progress Notes (Signed)
End of Shift Report     Significant Events Up to Chair for meals - feeling stronger.          Relevant   Assessment Findings LLE cellulitis- weeping with large blister. WOCN eval covered with silver and abd pad. Foot elevated while in bed.       Patient/Family  Questions/Concerns      DC Barriers IV ABX

## 2016-07-16 LAB — POCT GLUCOSE
POC Glucose: 222 mg/dL — ABNORMAL HIGH (ref 80–99)
POC Glucose: 222 mg/dL — ABNORMAL HIGH (ref 80–99)
POC Glucose: 220 mg/dL — ABNORMAL HIGH (ref 80–99)
POC Glucose: 290 mg/dL — ABNORMAL HIGH (ref 80–99)

## 2016-07-16 LAB — RENAL FUNCTION PANEL
Albumin: 3.1 g/dL — ABNORMAL LOW (ref 3.5–5.0)
Anion Gap: 11.8 mmol/L — ABNORMAL HIGH (ref 3.0–11.0)
BUN: 80 mg/dL — ABNORMAL HIGH (ref 6–23)
CO2 - Carbon Dioxide: 15.2 mmol/L — ABNORMAL LOW (ref 21.0–31.0)
Calcium: 7.9 mg/dL — ABNORMAL LOW (ref 8.6–10.3)
Chloride: 106 mmol/L (ref 98–111)
Creatinine: 5.03 mg/dL — ABNORMAL HIGH (ref 0.65–1.30)
GFR Estimate: 11 mL/min/{1.73_m2} — ABNORMAL LOW (ref >=60)
Glucose: 236 mg/dL — ABNORMAL HIGH (ref 80–99)
Phosphorus: 4.6 mg/dL — ABNORMAL HIGH (ref 2.5–4.5)
Potassium: 5.4 mmol/L — ABNORMAL HIGH (ref 3.5–5.1)
Sodium: 133 mmol/L — ABNORMAL LOW (ref 135–143)

## 2016-07-16 LAB — WOUND CULTURE, SUPERFICIAL
Culture Result: NO GROWTH
Gram Stain: NONE SEEN

## 2016-07-16 LAB — CORONARY RISK LIPID PANEL
Cholesterol, HDL: 27 mg/dL — ABNORMAL LOW (ref >=40)
Cholesterol: 117 mg/dL (ref <200)
LDL Calculated: 58 mg/dL (ref <100)
Triglyceride: 162 mg/dL — ABNORMAL HIGH (ref 30–149)

## 2016-07-16 MED ORDER — sodium chloride 0.9 % infusion
INTRAVENOUS | Status: DC
Start: 2016-07-16 — End: 2016-07-18
  Administered 2016-07-16 – 2016-07-19 (×7): via INTRAVENOUS

## 2016-07-16 MED FILL — LANTUS U-100 INSULIN 100 UNIT/ML SUBCUTANEOUS SOLUTION: 100 [IU]/mL | SUBCUTANEOUS | Qty: 0.26

## 2016-07-16 MED FILL — ENOXAPARIN 40 MG/0.4 ML SUBCUTANEOUS SYRINGE: 40 | SUBCUTANEOUS | Qty: 1

## 2016-07-16 MED FILL — MELATONIN 3 MG TABLET: 3 mg | ORAL | Qty: 1

## 2016-07-16 MED FILL — LEVOFLOXACIN IN D5W 750 MG/150 ML IVPB PREMIX: 750 mg/150 mL | INTRAVENOUS | Qty: 150

## 2016-07-16 MED FILL — MUCINEX 600 MG TABLET, EXTENDED RELEASE: 600 mg | ORAL | Qty: 2

## 2016-07-16 MED FILL — ASPIRIN 81 MG TABLET,ENTERIC COATED: 81 mg | ORAL | Qty: 1

## 2016-07-16 MED FILL — METOPROLOL TARTRATE 50 MG TABLET: 50 mg | ORAL | Qty: 3

## 2016-07-16 MED FILL — ATORVASTATIN 20 MG TABLET: 20 mg | ORAL | Qty: 1

## 2016-07-16 NOTE — Progress Notes (Addendum)
End of Shift Report     Significant Events Wound care nurse saw patient day shift yesterday for LLE cellulitis, orders for cleansing and aquacel/abd by bedside nurse in place. Leg is red, hot and painful from below the knee to the toes which is increased area from prior night.     Right lower leg anterior shin showing slight patchy redness as well.    Wound dressing changed and cleansed multiple times throughout shift, weeping serous fluid.    AM potassium 5.4     Relevant   Assessment Findings Patient is more alert and states he is feeling a lot better. Able to transfer from the bed to the chair with standby assistance    SR 70-90    No longer anticoagulating for NSTEMI      Patient/Family  Questions/Concerns None at this time     DC Barriers Continued IV abx - Levaquin     Hourly Rounding;  ??  The health care team, RN, CNAll team rounded hourly to;  ??  This ensured a high level of quality, safety checks, and satisfaction.   ??  This ensured patient expectations for excellent care, appropriate goals were set and assessed then updated.  ??  Purposeful Rounding included; Bedside RN handoff report, Medication Pass and education, Pain assessment, Potty check, Position/Confort check, Meal - food and diet check, General Safety of patient/room/bed/rest room/environmental, Plan of Care to include labs,tests,discharge plan and follow up.  RELATE -- Reassure, Explain, Listen, Answer, Take Action, Express appreciation.   ??

## 2016-07-16 NOTE — Progress Notes (Addendum)
End of Shift Report       Significant Events LLE cellulitis weeping, red and warm - multiple drsg changes.  Started IV NS @ 38ml/hr for elevated creattinine & K+  Up to chair.  Levaquin IV       Relevant   Assessment Findings K+=5.4. Creat=5.03      Patient/Family  Questions/Concerns none     DC Barriers Per MD     Hourly Rounding;  ?  The health care team, RN, CNAll team rounded hourly to;  ?  This ensured a high level of quality, safety checks, and satisfaction.   ?  This ensured patient expectations for excellent care, appropriate goals were set and assessed then updated.  ?  Purposeful Rounding included; Bedside RN handoff report, Medication Pass and education, Pain assessment, Potty check, Position/Confort check, Meal - food and diet check, General Safety of patient/room/bed/rest room/environmental, Plan of Care to include labs,tests,discharge plan and follow up.  RELATE -- Reassure, Explain, Listen, Answer, Take Action, Express appreciation.   ?      ? ?

## 2016-07-16 NOTE — Progress Notes (Signed)
Nutrition Assessment     Evan [name] is a 71 y.o. 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      Nutrition screening and assessment has been completed and we do not have any recommendations at this time. If the dietitian or other nutrition services staff might be of assistance with the nutritional care of this patient, it would be our pleasure.     Per nursing admit screen no weight loss or loss of appetite noted PTA.    Body mass index is 53.2 kg/m?[name]  Ht/wt status:morbid obesity    Diet Orders:   Procedures   . Diet, Diabetic Diabetic, Consistent Carbohydrate Medium; Sodium 3 GM; Heart Healthy; Yes - patient to order food   CBG's: 185-274 mg/dL.  Current diet appropriate.    P.O. Intake: 100% for most meals.    Will follow per protocol.  [place], [place]  07/16/2016 8:46 AM  [facility]

## 2016-07-16 NOTE — Other (Signed)
IP PT TREATMENT NOTE (NOT SEEN)    Patient:  Evan [name] DOB:  [birth year] / 71 y.o. Date:  07/16/2016     ASSESSMENT     No treatment provided.  Starling Hilton Sinclair was not seen for the following reason:    Refusal     PLAN     Philander Ake will remain on the Physical Therapy schedule until goals are met, or there has been a change in the Plan of Care.          Treatment Diagnosis Surgery / # Days Post-op (if applicable)    Primary Dx: Sepsis, cellulitis  Treatment Dx: Impaired mobility   /         SUBJECTIVE   States too fatigued to get up now.      OBJECTIVE   NA          Session times   Start/Stop Times:   -     Total Time:   minutes      This note to serve as a Discharge Summary if Emry Barbato is discharged from the hospital or from therapy services.     Therapist:  Burgess Estelle, PT

## 2016-07-16 NOTE — Progress Notes (Signed)
RESPIRATORY ASSESSMENT    SUBJECTIVE: CPAP but no resp meds at home, breathing fine       OBJECTIVE:   Current Vital Signs:    C/S: clear diminished   RR: 16               Cough:dry fair   SpO2:  RA 95 %        ASSESSMENT:  Resting quietly, no SOB or wheezing, has not been needing prn MN       PLAN: dc prn MN       GOAL(S):      Supplemental Oxygen:   Maintain SpO2 > 90  %  Normalize and maintain an adequate SpO2/PaO2 appropriate for patient clincial  situation    Per PDP # 102 and MD orders    E.S.  [name], RRT

## 2016-07-16 NOTE — Spiritual Care Consult (Signed)
Spiritual Assessment  07/16/2016    Evan [name] is a 71 y.o. year-old male who was admitted on 07/11/2016    Spiritual Assessment  Attempt / Visit #: First visit;Other (Comment) (asleep)  Ministry To:: Patient  Patient Referred By: Rounds  Follow up: Will follow up as requested    Chaplain note:

## 2016-07-16 NOTE — Progress Notes (Signed)
Daily Progress Note   Name: Evan [name]  DOB: Nov 24, 1945 71 y.o.  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 EMS was called and he was hypoxic and found to be septic with a lactic acid of 4. He had acute kidney injury with a creatinine up to 1.8 and an elevated troponin at 0.11. It is subsequently peaked at 7.7. Despite this he has never complained of chest pain. He has complained of feeling weak and having a cough and shortness of breath. He had a stress test which showed moderate probability of underlying coronary artery disease. They recommended medical management unless he had ongoing pain. His blood sugars have been elevated above 200 and we increased his lantus but they still remain elevated. He had an echocardiogram which showed right heart strain but does have a history of obstructive sleep apnea and obesity. He did have an ultrasound of his lower extremities which showed no evidence of DVT. A CT pulmonary angiogram showed no evidence of pulmonary emboli. Unfortunately it does look like he developed a contrast-induced nephropathy. It appeared his creatinine had plateaued yesterday so I stopped his IV fluids but it is up even further today. January 25 the patient's blood culture turned positive with gram-positive cocci he was started on vancomycin but it looks like it was a staph epidermidis so it was stopped.    Patient's last fever was January 26 at 1954. January 27 his leg was more swollen and his rash looked worse. It started leaking. Thinking he probably had a polymicrobial infection with his underlying diabetes we switched him from Rocephin to Levaquin. His leukocytosis has  resolved and I think his wound looks less erythematous although it is still quite edematous and yesterday wound care lanced this blister to get a wound culture so it is leaking profusely. That wound culture by Gram stain shows no bacteria.    He does feel a little bit more fatigued today. He is denying any chest pain or shortness of breath. He does say his left leg is quite swollen. He denies any nausea or vomiting.    Objective:   Vital Signs:  BP: 142/78  Pulse: 66  Resp: 20  Temp: 36.7 ?C (98 ?F)  SpO2: 98 % (01/29 1500)  Intake/Ouput:    Intake/Output Summary (Last 24 hours) at 07/16/16 1613  Last data filed at 07/16/16 1307   Gross per 24 hour   Intake             2020 ml   Output             2700 ml   Net             -680 ml      Exam:  General: Patient is in no acute distress.  Pulmonary: Clear to auscultation bilaterally without wheezes rales or rhonchi.  Cardiovascular: Regular rate and rhythm without murmurs rubs or gallops.   GI: Normal active bowel sounds, soft, non-distended, nontender.  Musculoskeletal: No clubbing cyanosis or edema. He does have 2+ pitting edema much greater on the left lower extremity than the right.  Dermatologic: Skin is ruddy and erythematous but is not  spread past where it was and seems to be a lighter shade of red.          Recent Labs:  Results for orders placed or performed during the hospital encounter of 07/11/16 (from the past 24 hour(s))   POCT Glucose -Next Routine    Collection Time: 07/15/16  4:34 PM   Result Value Ref Range    POC Glucose 222 (H) 80 - 99 mg/dL   Wound Culture, Superficial -Next Routine    Collection Time: 07/15/16  5:02 PM   Result Value Ref Range    Culture Result No Growth to Date     Gram Stain Rare Polymorphonuclear leukocytes     Gram Stain No organisms seen    POCT Glucose -Next Routine    Collection Time: 07/15/16  9:24 PM   Result Value Ref Range    POC Glucose 222 (H) 80 - 99 mg/dL   Coronary Risk Lipid Panel -AM Draw    Collection Time:  07/16/16  4:07 AM   Result Value Ref Range    Triglyceride 162 (H) 30 - 149 mg/dL    Cholesterol 086 <578 mg/dL    Cholesterol, HDL 27 (L) >=40 mg/dL    LDL Calculated 58 <469 mg/dL   Renal Function Panel -Daily    Collection Time: 07/16/16  4:07 AM   Result Value Ref Range    Albumin 3.1 (L) 3.5 - 5.0 g/dL    Calcium 7.9 (L) 8.6 - 10.3 mg/dL    Phosphorus 4.6 (H) 2.5 - 4.5 mg/dL    BUN 80 (H) 6 - 23 mg/dL    Creatinine 6.29 (H) 0.65 - 1.30 mg/dL    Sodium 528 (L) 413 - 143 mmol/L    Glucose 236 (H) 80 - 99 mg/dL    Potassium 5.4 (H) 3.5 - 5.1 mmol/L    Chloride 106 98 - 111 mmol/L    CO2 - Carbon Dioxide 15.2 (L) 21.0 - 31.0 mmol/L    Anion Gap 11.8 (H) 3.0 - 11.0 mmol/L    GFR Estimate 11 (L) >=60 mL/min/1.81m*2    GFR Additional Info     POCT Glucose -Next Routine    Collection Time: 07/16/16  5:24 AM   Result Value Ref Range    POC Glucose 220 (H) 80 - 99 mg/dL   POCT Glucose -Next Routine    Collection Time: 07/16/16 11:48 AM   Result Value Ref Range    POC Glucose 290 (H) 80 - 99 mg/dL     Pending Labs     Order Current Status    Blood Culture -x 2 Preliminary result    Blood Culture -x 2 Preliminary result        Recent Imaging:  No results found.    Assessment & Plan:   1: severe sepsis on presentation. Seems out of proportion to what I would expect from his cellulitis. His blood pressure and sepsis seems better And I think his cellulitis is finally turning around.  2. Syncope. Unclear etiology. He did have a non-ST elevation MI and this may have caused it. Doppler unremarkable, no ectopy on telemetry. His echocardiogram showed right heart strain.   3. Non-ST elevation MI. His troponin has peaked at 7.7. I did talk to the cardiologist on-call who read his stress test and refer the whole story and he said unless the patient is having chest pain he would medically manage him. Patient is already on an aspirin, beta blocker, which I increased, and  statin and he did not recommend long-acting nitrate in the  absence of chest pain. He has been on Lovenox which I stopped yesterday. Fasting lipid profile looked okay. His hydrochlorothiazide and ACE inhibitor are on hold due to his renal failure.  4. Lower extremity cellulitis. Likely source was an ingrown toenail. He is a diabetic and I think he is doing better on Levaquin renally dose adjusted to every 48 hours. We are also elevating his leg. Wound care did get cultures yesterday which are no growth to date.  5. Acute kidney injury on chronic kidney disease stage III, suspect contrast nephropathy. Creatinine is up again after he stopped his IV fluids yesterday. I will restart them. I think overall he does look volume long simonian ago at 75 mL an hour.  6. Diabetes type 2. His blood sugar still remains over 200. I will increase his Lantus again today. His metformin and Amaryl are on hold  7. Ingrown toenail. This could be the source of his cellulitis.  8. Edema. He is up almost 5 L since admission. Due to his acute kidney injury I will hold off Lasix for one more day and likely will start tomorrow.    LOS 4 days  Total time spent 26 minutes.  Electronically signed by: Iantha Fallen Sanford1/29/20184:13 PM  This dictation was produced using voice recognition software. Despite concurrent proofreading, please note that homonyms and other transcription errors may be present and that these errors may not truly reflect my intent.

## 2016-07-17 LAB — RENAL FUNCTION PANEL
Albumin: 2.9 g/dL — ABNORMAL LOW (ref 3.5–5.0)
Anion Gap: 12.1 mmol/L — ABNORMAL HIGH (ref 3.0–11.0)
BUN: 88 mg/dL — ABNORMAL HIGH (ref 6–23)
CO2 - Carbon Dioxide: 18.9 mmol/L — ABNORMAL LOW (ref 21.0–31.0)
Calcium: 8.1 mg/dL — ABNORMAL LOW (ref 8.6–10.3)
Chloride: 105 mmol/L (ref 98–111)
Creatinine: 5.05 mg/dL — ABNORMAL HIGH (ref 0.65–1.30)
GFR Estimate: 11 mL/min/{1.73_m2} — ABNORMAL LOW (ref >=60)
Glucose: 209 mg/dL — ABNORMAL HIGH (ref 80–99)
Phosphorus: 4.8 mg/dL — ABNORMAL HIGH (ref 2.5–4.5)
Potassium: 5 mmol/L (ref 3.5–5.1)
Sodium: 136 mmol/L (ref 135–143)

## 2016-07-17 LAB — POCT GLUCOSE
POC Glucose: 241 mg/dL — ABNORMAL HIGH (ref 80–99)
POC Glucose: 251 mg/dL — ABNORMAL HIGH (ref 80–99)
POC Glucose: 197 mg/dL — ABNORMAL HIGH (ref 80–99)
POC Glucose: 280 mg/dL — ABNORMAL HIGH (ref 80–99)

## 2016-07-17 MED ORDER — polyethylene glycol (MIRALAX) packet 17 g
17 | Freq: Every day | ORAL | Status: DC
Start: 2016-07-17 — End: 2016-08-02
  Administered 2016-07-23: 16:00:00 17 g via ORAL

## 2016-07-17 MED ORDER — senna-docusate (PERICOLACE) 8.6-50 mg per tablet 2 tablet
8.6-50 | Freq: Two times a day (BID) | ORAL | Status: DC
Start: 2016-07-17 — End: 2016-08-02
  Administered 2016-07-17 – 2016-08-02 (×24): via ORAL

## 2016-07-17 MED ORDER — heparin, porcine (PF) syringe 5,000 Units
5000 | Freq: Three times a day (TID) | INTRAMUSCULAR | Status: DC
Start: 2016-07-17 — End: 2016-08-02
  Administered 2016-07-18 – 2016-08-02 (×46): via SUBCUTANEOUS

## 2016-07-17 MED ORDER — insulin glargine (LANTUS) injection 32 Units
100 | Freq: Every evening | SUBCUTANEOUS | Status: DC
Start: 2016-07-17 — End: 2016-07-18
  Administered 2016-07-17 – 2016-07-18 (×2): 100 [IU] via SUBCUTANEOUS

## 2016-07-17 MED ORDER — furosemide (LASIX) injection 20 mg
10 | Freq: Once | INTRAMUSCULAR | Status: AC
Start: 2016-07-17 — End: 2016-07-17
  Administered 2016-07-17: 23:00:00 10 mg via INTRAVENOUS

## 2016-07-17 MED FILL — LANTUS U-100 INSULIN 100 UNIT/ML SUBCUTANEOUS SOLUTION: 100 [IU]/mL | SUBCUTANEOUS | Qty: 0.32

## 2016-07-17 MED FILL — CEPACOL SORE THROAT (BENZOCAINE-MENTHOL) 15 MG-2.6 MG LOZENGES: 15-2.6 mg | Qty: 1

## 2016-07-17 MED FILL — SENNA PLUS 8.6 MG-50 MG TABLET: 8.6-50 mg | ORAL | Qty: 2

## 2016-07-17 MED FILL — METOPROLOL TARTRATE 50 MG TABLET: 50 mg | ORAL | Qty: 3

## 2016-07-17 MED FILL — ATORVASTATIN 20 MG TABLET: 20 mg | ORAL | Qty: 1

## 2016-07-17 MED FILL — MIRALAX 17 GRAM ORAL POWDER PACKET: 17 g | ORAL | Qty: 1

## 2016-07-17 MED FILL — LANTUS U-100 INSULIN 100 UNIT/ML SUBCUTANEOUS SOLUTION: 100 [IU]/mL | SUBCUTANEOUS | Qty: 0.26

## 2016-07-17 MED FILL — ENOXAPARIN 40 MG/0.4 ML SUBCUTANEOUS SYRINGE: 40 | SUBCUTANEOUS | Qty: 1

## 2016-07-17 MED FILL — FUROSEMIDE 10 MG/ML INJECTION SOLUTION: 10 mg/mL | INTRAMUSCULAR | Qty: 2

## 2016-07-17 MED FILL — ASPIRIN 81 MG TABLET,ENTERIC COATED: 81 mg | ORAL | Qty: 1

## 2016-07-17 MED FILL — MUCINEX 600 MG TABLET, EXTENDED RELEASE: 600 mg | ORAL | Qty: 2

## 2016-07-17 NOTE — Progress Notes (Signed)
End of Shift Report       Significant Events LLE cellulitis continues to weep, red, warm with several drsg changed x2 this shift.  Continues on NS @ 18ml/hr  Given test dose of Lasix to monitor renal function.     Relevant   Assessment Findings Renal labs and K+ somewhat improved since yesterday      Patient/Family  Questions/Concerns none     DC Barriers Per MD     Hourly Rounding;  ?  The health care team, RN, CNAll team rounded hourly to;  ?  This ensured a high level of quality, safety checks, and satisfaction.   ?  This ensured patient expectations for excellent care, appropriate goals were set and assessed then updated.  ?  Purposeful Rounding included; Bedside RN handoff report, Medication Pass and education, Pain assessment, Potty check, Position/Confort check, Meal - food and diet check, General Safety of patient/room/bed/rest room/environmental, Plan of Care to include labs,tests,discharge plan and follow up.  RELATE -- Reassure, Explain, Listen, Answer, Take Action, Express appreciation.   ?      ? ?

## 2016-07-17 NOTE — Progress Notes (Signed)
End of Shift Report     Significant Events Elevated BP early NOC, improved with scheduled Metoprolol.     Relevant   Assessment Findings VSS; NSR 70's.  L leg red, weeping, wound draining serous fluid; drsg changed 2x.  Elevated BUN and Creatinine; GFR 11.        Patient/Family  Questions/Concerns None verbalized     DC Barriers Medical clearance     The health care team, RN, CNA ll team rounded hourly to:  ?  Ensure a high level of quality, safety checks, and satisfaction.   ?  Ensure patient expectations for excellent care, appropriate goals were set and assessed then updated.  ?  Purposeful Rounding included;  Bedside RN handoff report, Medication Pass and education, Pain assessment, Potty check, Position/Comfort check, Meal - food and diet check, General Safety of patient/room/bed/rest room/environmental, Plan of Care to include labs,tests,discharge plan and follow up.    ?  RELATE  -- Reassure, Explain, Listen, Answer, Take Action, Express appreciation.   ?

## 2016-07-17 NOTE — Progress Notes (Signed)
Daily Progress Note   Name: Viola Placeres  DOB: 09-30-1945 71 y.o.  MRN: [id number]  CSN: [id number]  PCP: Remi Deter [name]    Subjective:   Chief Complaint: sepsis left leg cellulitis    Patient is a very pleasant 71 year old male with a history of morbid obesity, hypertension, history of sleep apnea on C Pap, chronic kidney disease stage III, diabetes type 2 on insulin who presented with increasing weakness fevers and a syncopal episode. EMS was called and he was hypoxic and found to be septic with a lactic acid of 4. He had acute kidney injury with a creatinine up to 1.8 and an elevated troponin at 0.11. It is subsequently peaked at 7.7. Despite this he has never complained of chest pain. He has complained of feeling weak and having a cough and shortness of breath. He had a stress test which showed moderate probability of underlying coronary artery disease. They recommended medical management unless he had ongoing pain. His blood sugars have been elevated above 200 and we increased his lantus but they still remain elevated. He had an echocardiogram which showed right heart strain but does have a history of obstructive sleep apnea and obesity. He did have an ultrasound of his lower extremities which showed no evidence of DVT. A CT pulmonary angiogram showed no evidence of pulmonary emboli. Unfortunately it does look like he developed a contrast-induced nephropathy. It appeared his creatinine had plateaued yesterday so I stopped his IV fluids but it is up even further today. January 25 the patient's blood culture turned positive with gram-positive cocci he was started on vancomycin but it looks like it was a staph epidermidis so it was stopped.    Patient's last fever was January 26 at 1954. January 27 his leg was more swollen and his rash looked worse. It started leaking. Thinking he probably had a polymicrobial infection with his underlying diabetes we switched him from Rocephin to Levaquin. His leukocytosis has  resolved and the wound looks less erythematous although it is still quite swollen and leaking serous fluid. The wound culture by Gram stain shows no bacteria and is no growth to date.    Patient is complaining of constipation today. He is denying any chest pain or shortness of breath. No nausea or vomiting.    Objective:   Vital Signs:  BP: 142/78  Pulse: 63  Resp: 22  Temp: 36.7 ?C (98.1 ?F)  SpO2: 96 % (01/30 1119)  Intake/Ouput:    Intake/Output Summary (Last 24 hours) at 07/17/16 1319  Last data filed at 07/17/16 1119   Gross per 24 hour   Intake             2550 ml   Output             2225 ml   Net              325 ml      Exam:  General: Patient is in no acute distress.  Pulmonary: Clear to auscultation bilaterally without wheezes rales or rhonchi.  Cardiovascular: Regular rate and rhythm without murmurs rubs or gallops.   GI: Normal active bowel sounds, soft, non-distended, nontender.  Musculoskeletal: No clubbing cyanosis But does have 2+ pitting bilateral lower extremity edema.  Dermatologic: His cellulitis looks a little less erythematous and angry but is still quite swollen with serous drainage          Recent Labs:  Results for orders placed or performed during the hospital encounter  of 07/11/16 (from the past 24 hour(s))   POCT Glucose -Next Routine    Collection Time: 07/16/16  4:38 PM   Result Value Ref Range    POC Glucose 241 (H) 80 - 99 mg/dL   POCT Glucose -Next Routine    Collection Time: 07/16/16  9:09 PM   Result Value Ref Range    POC Glucose 251 (H) 80 - 99 mg/dL   Renal Function Panel -Daily    Collection Time: 07/17/16  3:40 AM   Result Value Ref Range    Albumin 2.9 (L) 3.5 - 5.0 g/dL    Calcium 8.1 (L) 8.6 - 10.3 mg/dL    Phosphorus 4.8 (H) 2.5 - 4.5 mg/dL    BUN 88 (H) 6 - 23 mg/dL    Creatinine 2.95 (H) 0.65 - 1.30 mg/dL    Sodium 621 308 - 657 mmol/L    Glucose 209 (H) 80 - 99 mg/dL    Potassium 5.0 3.5 - 5.1 mmol/L    Chloride 105 98 - 111 mmol/L    CO2 - Carbon Dioxide 18.9 (L) 21.0  - 31.0 mmol/L    Anion Gap 12.1 (H) 3.0 - 11.0 mmol/L    GFR Estimate 11 (L) >=60 mL/min/1.35m*2    GFR Additional Info     POCT Glucose -Next Routine    Collection Time: 07/17/16  5:51 AM   Result Value Ref Range    POC Glucose 197 (H) 80 - 99 mg/dL   POCT Glucose -Next Routine    Collection Time: 07/17/16 11:33 AM   Result Value Ref Range    POC Glucose 280 (H) 80 - 99 mg/dL     Pending Labs     Order Current Status    Blood Culture -x 2 Preliminary result    Blood Culture -x 2 Preliminary result        Recent Imaging:  No results found.    Assessment & Plan:   1: severe sepsis on presentation. Seems out of proportion to what I would expect from his cellulitis. His blood pressure and sepsis Have resolved.  2. Syncope. Unclear etiology. He did have a non-ST elevation MI and this may have caused it. Doppler unremarkable, no ectopy on telemetry. His echocardiogram showed right heart strain.   3. Non-ST elevation MI. His troponin has peaked at 7.7. I did talk to the cardiologist on-call who read his stress test and refer the whole story and he said unless the patient is having chest pain he would medically manage him. Patient is already on an aspirin, beta blocker, which I increased, and statin and he did not recommend long-acting nitrate in the absence of chest pain. He has been on Lovenox which I stopped after 3 days. Fasting lipid profile looked okay. His hydrochlorothiazide and ACE inhibitor are on hold due to his renal failure.  4. Lower extremity cellulitis. Likely source was an ingrown toenail. He is a diabetic and I think he is doing better on Levaquin renally dose adjusted to every 48 hours. We are also elevating his leg. Wound care did get cultures which are no growth to date.  5. Acute kidney injury on chronic kidney disease stage III, secondary to contrast nephropathy. Creatinine has stabilized at 5. I discussed with nephrology again today and they feel it would be okay to start him on a low dose of Lasix  once and reevaluate his creatinine tomorrow. It should not affect his contrast nephropathy.  6. Diabetes type 2. Blood sugars  remain high. I increased his Lantus and carb coverage yesterday we'll give it another day before adjusting. His metformin and Amaryl are on hold  7. Ingrown toenail. This could be the source of his cellulitis.  8. Edema. He is up almost 5 L since admission. He was only on hydrochlorothiazide at home. I'm going to start a low dose of Lasix today and reevaluate tomorrow.    LOS 5 days  Total time spent 24 minutes.  Electronically signed by: Iantha Fallen Sanford1/30/20181:19 PM  This dictation was produced using voice recognition software. Despite concurrent proofreading, please note that homonyms and other transcription errors may be present and that these errors may not truly reflect my intent.

## 2016-07-17 NOTE — Treatment Summary (Signed)
IP PT TREATMENT NOTE     Patient:  Evan [name] / [id number] DOB:  May 22, 1946 / 71 y.o. Date:  07/17/2016     Precautions  Specific mobility precautions: None  Other: Fall     ASSESSMENT     Summary:  Primary barrier to mobility is generalized weakness, bilateral LE pain and limited activity tolerance  Pt completing transfers only at this time, was able to stand for approximately 5 minutes for 3 sets of standing marching activities, but c/o bilateral LE feeling 'weak and unsafe to attempt walking at this time'  Mod A needed for supine > sit  Pt SOB with standing, stepping and transfer activities and SpO2 remained > 95% on RA  At his current status, pt will benefit from 24 hour assist and ongoing low intensity PT and probably interim placement, upon hospital D/C  He remains appropriate for IP PT to assist him in improving activity tolerance, gait safety and improving independence with mobility     Able to be up with Nursing Team (x 1).;Able to be up with Nursing Team (x 2).     Activity tolerance: Tolerates 30 min activity with multiple rests  Comments: Generalized weakness     Precaution Awareness: No specific precautions  Deficit Awareness: Fully aware of deficits  Correction of Errors: Self-corrects with cues  Safety Judgment: Good awareness of safety     Discharge recommendations  Mobility Equipment needs: Front wheeled walker (FWW) (bariatric)   Primary recommendation : Patient would benefit from SNF/Nursing Home/Other Skilled Facility.  Recommended transport method: Wheelchair  Secondary recommendation: Patient would benefit from Home Health PT.  Recommended transport method: Wheelchair  Justification: Progressive gait, balance, strengthening and mobility activities for safety and independence with retun to home      PLAN     Treatment Plan Frequency    Plan: Continue per established POC.  Pt. demonstrates limitations which require skilled PT intervention.  1x (5 days/week).  Frequency will be increased  as pt's condition or discharge plan indicates.     Evan [name] will remain on the Physical Therapy schedule until goals are met, or there has been a change in the Plan of Care.          Treatment Diagnosis Surgery / # Days Post-op (if applicable)    Primary Dx: Sepsis, cellulitis  Treatment Dx: Impaired mobility   /         SUBJECTIVE   Reviewed chart and nursing approved PT at this time. Pt supine resting soundly. He awoke to his name and was agreeable to PT.     Pain Level  Current status: Pain somewhat limits pt's ability to participate in therapy  Pain location: Leg (L lower leg, R knee)  Wong-Baker FACES: 4 - Hurts little more     OBJECTIVE   LE exercises, mobility, transfer and standing balance/marching activities (See below)     Mental status  General Demeanor: Pleasant;Cooperative  LOC: Alert  Orientation: Oriented x 4  --> Follows multi-step commands: Consistently  --> Follows one-step commands: Consistently  Attention span: Appears intact  Memory: Appears intact in social/therapy situations   Medical appliances  Medical Appliances: IV;Telemetry;SPO2 monitor;HR monitor   Vitals  Vitals  Position: Sitting  Activity: Immediate Post-Exercise  HR: 91 bpm  SPO2: 97% on RA        TREATMENT  Bed Mobility/Transfers  Bed mobility       Rolling / logroll  Rolling R: SBA  Transition to sitting up Sidelying to Sit: Mod Assist (Assist with legs and UE support)   To resting position      Transition to sitting down CGA;SBA     Transition to supine       Scooting     Transfers      Transition to standing From bed: Min Assist (Bed slightly elevated)     Method Stand pivot: CGA      Gait/Stairs  Mobility - Gait (Level)  Gait (distance): 3 feet (Sidestepping)  Gait (assist level): CGA;SBA  Assistive device: Front wheeled walker (FWW)  Endurance: Poor +  Pace: Slow  Pattern: Downward gaze;Wide BOS;Antalgic;Increased upper body tension;Shuffling;Decreased foot clearance L;Decreased foot clearance R;Decreased step  length L;Decreased step length R  Surface: Level tile    Ther. Ex.  LE exercises: Gluteal sets  Ankle pumps: 10  Quad sets: 8  Gluteal sets: 8  Seated long arc quads: 8  Addit. Exercises: Standing marching 10 reps x 2, 5 reps x 1    Balance   No LOB   Training/Education  Trainee(s): Primary Learner  Primary Learner: Patient  Primary Learner - Barriers to Learning?: Yes  Primary Learner - Specific barriers: Physical  Training provided: LE exercises, standing balance, review of marching activities  Response to training: Receptive   Safety/Room Set-up   Call button accessible?: Yes  Phone accessible?: Yes  Oxygen reconnected?: No, on room air  Patient mobility in room: Able to be up with Nursing Team (x 1).;Able to be up with Nursing Team (x 2).  Position on arrival: In bed  Position on departure (upright): In recliner chair  Comments: Nursing aware that pt is up in chair     GOALS     Patient/Caregiver goals reviewed and integrated with rehab treatment plan:      Multidisciplinary Problems (Active)        Problem: IP General Goal List - 2    Goal Priority Disciplines Outcome   Bed Mobility     PT    Description:  Pt. to perform bed mobility with min assist.    Transfers     PT    Description:  Pt. to perform all functional transfers with min. assist.    Ambulation - Levels     PT    Description:  Pt. to ambulate with min assist x 50 feet with appropriate assistive device.    Ambulation - Stairs     PT    Description:  Pt. to ascend/descend stairs with min assist, as per discharge environment.    Home Exercise Program     PT    Description:  Pt. to perform basic HEP with min assist.    Caregiver Training     PT    Description:  Patient/caregiver training will be provided, as needed to achieve goals.             Problem: Patient/Family Goal    Goal Priority Disciplines Outcome   Normal Status     PT    Description:  Pt. wants to return to previous level of function.     Home     PT    Description:  Pt. wants to return  to previous living situation.    Mobility     PT    Description:  Patient wants to walk again.  First session Second session (if applicable)    Start/Stop times: 1520 - 1612 Start/Stop times:   - Stop Time:     Total time: 52 minutes  Total time:   minutes     This note to serve as a Discharge Summary if Evan [name] is discharged from the hospital or from therapy services.     Therapist:  Rana Snare, PT

## 2016-07-18 LAB — RENAL FUNCTION PANEL
Albumin: 3 g/dL — ABNORMAL LOW (ref 3.5–5.0)
Anion Gap: 11 mmol/L (ref 3.0–11.0)
BUN: 99 mg/dL — ABNORMAL HIGH (ref 6–23)
CO2 - Carbon Dioxide: 20 mmol/L — ABNORMAL LOW (ref 21.0–31.0)
Calcium: 8.4 mg/dL — ABNORMAL LOW (ref 8.6–10.3)
Chloride: 106 mmol/L (ref 98–111)
Creatinine: 5.1 mg/dL — ABNORMAL HIGH (ref 0.65–1.30)
GFR Estimate: 11 mL/min/{1.73_m2} — ABNORMAL LOW (ref >=60)
Glucose: 182 mg/dL — ABNORMAL HIGH (ref 80–99)
Phosphorus: 5.7 mg/dL — ABNORMAL HIGH (ref 2.5–4.5)
Potassium: 5.3 mmol/L — ABNORMAL HIGH (ref 3.5–5.1)
Sodium: 137 mmol/L (ref 135–143)

## 2016-07-18 LAB — POCT GLUCOSE
POC Glucose: 184 mg/dL — ABNORMAL HIGH (ref 80–99)
POC Glucose: 229 mg/dL — ABNORMAL HIGH (ref 80–99)
POC Glucose: 251 mg/dL — ABNORMAL HIGH (ref 80–99)
POC Glucose: 262 mg/dL — ABNORMAL HIGH (ref 80–99)

## 2016-07-18 MED ORDER — furosemide (LASIX) injection 20 mg
10 | Freq: Once | INTRAMUSCULAR | Status: AC
Start: 2016-07-18 — End: 2016-07-18
  Administered 2016-07-18: 10 mg via INTRAVENOUS

## 2016-07-18 MED ORDER — sodium polystyrene WITH SORBITOL (SPS) 15-20 gram/60 mL oral suspension 15 g
15-20 | Freq: Once | ORAL | Status: AC
Start: 2016-07-18 — End: 2016-07-18
  Administered 2016-07-19: 01:00:00 15-20 g via ORAL

## 2016-07-18 MED FILL — LEVOFLOXACIN IN D5W 750 MG/150 ML IVPB PREMIX: 750 mg/150 mL | INTRAVENOUS | Qty: 150

## 2016-07-18 MED FILL — SENNA PLUS 8.6 MG-50 MG TABLET: 8.6-50 mg | ORAL | Qty: 2

## 2016-07-18 MED FILL — HEPARIN, PORCINE (PF) 5,000 UNIT/0.5 ML SYRINGE: 5000 unit/0.5 mL | INTRAMUSCULAR | Qty: 0.5

## 2016-07-18 MED FILL — MUCINEX 600 MG TABLET, EXTENDED RELEASE: 600 mg | ORAL | Qty: 2

## 2016-07-18 MED FILL — SPS (WITH SORBITOL) 15 GRAM-20 GRAM/60 ML ORAL SUSPENSION: 15-20 gram/60 mL | ORAL | Qty: 60

## 2016-07-18 MED FILL — ATORVASTATIN 20 MG TABLET: 20 mg | ORAL | Qty: 1

## 2016-07-18 MED FILL — MIRALAX 17 GRAM ORAL POWDER PACKET: 17 g | ORAL | Qty: 1

## 2016-07-18 MED FILL — METOPROLOL TARTRATE 50 MG TABLET: 50 mg | ORAL | Qty: 3

## 2016-07-18 MED FILL — FUROSEMIDE 10 MG/ML INJECTION SOLUTION: 10 mg/mL | INTRAMUSCULAR | Qty: 2

## 2016-07-18 MED FILL — LANTUS U-100 INSULIN 100 UNIT/ML SUBCUTANEOUS SOLUTION: 100 [IU]/mL | SUBCUTANEOUS | Qty: 0.32

## 2016-07-18 MED FILL — ASPIRIN 81 MG TABLET,ENTERIC COATED: 81 mg | ORAL | Qty: 1

## 2016-07-18 MED FILL — HYDROCODONE 5 MG-ACETAMINOPHEN 325 MG TABLET: 5-325 mg | ORAL | Qty: 1

## 2016-07-18 NOTE — Progress Notes (Signed)
End of Shift Report     Significant Events Dry,red area on back, applied dressing;      Relevant   Assessment Findings VSS; SR 60s-100s. LLE dressing CDI      Patient/Family  Questions/Concerns n/a     DC Barriers Medical clearance     Hourly Rounding;  ??  The health care team, RN, CNAll team performed hourly rounding:  ??  This ensured a high level of quality, safety checks, and satisfaction.   ??  This ensured patient expectations for excellent care, appropriate goals were set, assessed, then updated.  ??  Purposeful Rounding included: Bedside RN handoff report; Medication Pass and education; Pain assessment; Potty check; Position/Comfort check; Meal - food and diet check; General Safety of patient/room/bed/rest room/environmental; Plan of Care to include labs,tests,discharge plan and follow up.      RELATE -- Reassure, Explain, Listen, Answer, Take Action, Express appreciation.  ?

## 2016-07-18 NOTE — Treatment Summary (Signed)
IP PT TREATMENT NOTE     Patient:  Evan [name] / [id number] DOB:  03-30-1946 / 71 y.o. Date:  07/18/2016     Precautions  Specific mobility precautions: None     ASSESSMENT     Summary:  Pt needing min A for bed mobility  Stood, walked in place for ~2 mins, stepped around to reclining chair  Up in chair, with tray table, all needs met     Able to be up with Nursing Team (x 1).;Able to be up with Nursing Team (x 2).     Discharge recommendations  Mobility Equipment needs: Front wheeled walker (FWW) (bariatric)   Primary recommendation : Patient would benefit from SNF/Nursing Home/Other Skilled Facility.  Recommended transport method: Wheelchair  Secondary recommendation: Patient would benefit from Home Health PT.  Recommended transport method: Wheelchair  Justification: Progressive gait, balance, strengthening and mobility activities for safety and independence with retun to home      PLAN     Treatment Plan Frequency    Plan: Continue per established POC.  Pt. demonstrates limitations which require skilled PT intervention.   .  Frequency will be increased as pt's condition or discharge plan indicates.     Evan [name] will remain on the Physical Therapy schedule until goals are met, or there has been a change in the Plan of Care.          Treatment Diagnosis Surgery / # Days Post-op (if applicable)    Primary Dx: Sepsis, cellulitis  Treatment Dx: Impaired mobility   /         SUBJECTIVE   Pt resting in bed, agrees to PT     Pain Level  Current status: Pain somewhat limits pt's ability to participate in therapy  Pain at start of session: 2  Pain at end of session: 2  Pain at rest: 2  Pain location: Leg (L)     OBJECTIVE   Strengthening, safe mobility, transfers     Mental status  General Demeanor: Pleasant;Cooperative  LOC: Alert  Orientation: Oriented x 4  --> Follows multi-step commands: Consistently  --> Follows one-step commands: Consistently  Attention span: Appears intact  Memory: Appears intact in  social/therapy situations     TREATMENT  Bed Mobility/Transfers  Bed mobility       Rolling / logroll        Transition to sitting up Supine to Sit: Min Assist   To resting position      Transition to sitting down CGA;Verbal cues     Transition to supine       Scooting     Transfers      Transition to standing From bed: CGA;Verbal cues     Method Stand pivot: CGA;SBA      Gait/Stairs  Mobility - Gait (Level)  Gait (distance): 10 feet  Gait (assist level): CGA;SBA  Assistive device: Front wheeled walker (FWW)  Endurance: poor+  Pace: slow  Pattern: Wide BOS;Decreased foot clearance L;Decreased foot clearance R  Surface: Level tile      Safety/Room Set-up   Call button accessible?: Yes  Phone accessible?: Yes  Oxygen reconnected?: No, on room air  Patient mobility in room: Able to be up with Nursing Team (x 1).;Able to be up with Nursing Team (x 2).  Position on arrival: In bed  Position on departure (upright): In recliner chair     GOALS     Patient/Caregiver goals reviewed and integrated with rehab treatment plan:  Multidisciplinary Problems (Active)        Problem: IP General Goal List - 2    Goal Priority Disciplines Outcome   Bed Mobility     PT    Description:  Pt. to perform bed mobility with min assist.    Transfers     PT    Description:  Pt. to perform all functional transfers with min. assist.    Ambulation - Levels     PT    Description:  Pt. to ambulate with min assist x 50 feet with appropriate assistive device.    Ambulation - Stairs     PT    Description:  Pt. to ascend/descend stairs with min assist, as per discharge environment.    Home Exercise Program     PT    Description:  Pt. to perform basic HEP with min assist.    Caregiver Training     PT    Description:  Patient/caregiver training will be provided, as needed to achieve goals.             Problem: Patient/Family Goal    Goal Priority Disciplines Outcome   Normal Status     PT    Description:  Pt. wants to return to previous level of  function.     Home     PT    Description:  Pt. wants to return to previous living situation.    Mobility     PT    Description:  Patient wants to walk again.                      G-Codes/PSFS/FIM Scores: (if applicable)          First session   Start/Stop times: 1105 - 1135   Total time: 30 minutes      This note to serve as a Discharge Summary if Evan [name] is discharged from the hospital or from therapy services.     Therapist:  Heloise Beecham, PTA

## 2016-07-18 NOTE — Progress Notes (Signed)
Daily Progress Note   Name: Evan [name]  DOB: 08-20-1945 71 y.o.  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 EMS was called and he was hypoxic and found to be septic with a lactic acid of 4. He had acute kidney injury with a creatinine up to 1.8 and an elevated troponin at 0.11. It is subsequently peaked at 7.7. Despite this he has never complained of chest pain. He has complained of feeling weak and having a cough and shortness of breath. He had a stress test which showed moderate probability of underlying coronary artery disease. They recommended medical management unless he had ongoing pain. His blood sugars have been elevated above 200 and we increased his lantus but they still remain elevated. He had an echocardiogram which showed right heart strain but does have a history of obstructive sleep apnea and obesity. He did have an ultrasound of his lower extremities which showed no evidence of DVT. A CT pulmonary angiogram showed no evidence of pulmonary emboli. Unfortunately it does look like he developed a contrast-induced nephropathy. We have had fluids on and off and it appears he is plateaued at about a creatinine of 5.1. I did ask nephrology to see him today. They agreed with stopping his fluids and are trying another dose of Lasix today. Is already put out a liter and a half more than he put out all day yesterday. January 25 the patient's blood culture turned positive with gram-positive cocci he was started on vancomycin but it looks like it was a staph epidermidis so it was stopped.    Patient's last fever was January 26 at 1954. January 27 his leg was more swollen and his rash  looked worse. It started leaking. Thinking he probably had a polymicrobial infection with his underlying diabetes we switched him from Rocephin to Levaquin. His leukocytosis has resolved and the wound looks less erythematous although it is still quite swollen and leaking serous fluid. The wound culture by Gram stain shows no bacteria and is no growth to date now at day 3 so unlikely to grown anything.    Patient did have a bowel movement yesterday is not having any chest pain, nausea or vomiting but does have a little shortness of breath with exertion.    Objective:   Vital Signs:  BP: 122/74  Pulse: 64  Resp: 22  Temp: 36.7 ?C (98 ?F)  SpO2: 98 % (01/31 1542)  Intake/Ouput:    Intake/Output Summary (Last 24 hours) at 07/18/16 1758  Last data filed at 07/18/16 1709   Gross per 24 hour   Intake              750 ml   Output             3375 ml   Net            -2625 ml      Exam:  General: Patient is in no acute distress.  Pulmonary: Clear to auscultation bilaterally without wheezes rales or rhonchi.  Cardiovascular: Regular rate and rhythm without murmurs rubs or gallops.   GI: Normal active bowel sounds, soft, non-distended, nontender.  Musculoskeletal: No  clubbing cyanosis but does have 2+ pitting bilateral lower extremity edema greater on the left.  Dermatologic: Erythema has not spread past the line still edematous but looks less angry          Recent Labs:  Results for orders placed or performed during the hospital encounter of 07/11/16 (from the past 24 hour(s))   POCT Glucose -Next Routine    Collection Time: 07/17/16  9:32 PM   Result Value Ref Range    POC Glucose 251 (H) 80 - 99 mg/dL   Renal Function Panel -Daily    Collection Time: 07/18/16  3:56 AM   Result Value Ref Range    Albumin 3.0 (L) 3.5 - 5.0 g/dL    Calcium 8.4 (L) 8.6 - 10.3 mg/dL    Phosphorus 5.7 (H) 2.5 - 4.5 mg/dL    BUN 99 (H) 6 - 23 mg/dL    Creatinine 4.01 (H) 0.65 - 1.30 mg/dL    Sodium 027 253 - 664 mmol/L    Glucose 182 (H) 80 - 99  mg/dL    Potassium 5.3 (H) 3.5 - 5.1 mmol/L    Chloride 106 98 - 111 mmol/L    CO2 - Carbon Dioxide 20.0 (L) 21.0 - 31.0 mmol/L    Anion Gap 11.0 3.0 - 11.0 mmol/L    GFR Estimate 11 (L) >=60 mL/min/1.75m*2    GFR Additional Info     POCT Glucose -Next Routine    Collection Time: 07/18/16  6:25 AM   Result Value Ref Range    POC Glucose 184 (H) 80 - 99 mg/dL   POCT Glucose -Next Routine    Collection Time: 07/18/16 11:16 AM   Result Value Ref Range    POC Glucose 262 (H) 80 - 99 mg/dL   POCT Glucose -Next Routine    Collection Time: 07/18/16  4:08 PM   Result Value Ref Range    POC Glucose 226 (H) 80 - 99 mg/dL     Pending Labs     Order Current Status    Blood Culture -x 2 Preliminary result    Blood Culture -x 2 Preliminary result        Recent Imaging:  No results found.    Assessment & Plan:   1: severe sepsis on presentation. Seems out of proportion to what I would expect from his cellulitis. His blood pressure and sepsis Have resolved.  2. Syncope. Unclear etiology. He did have a non-ST elevation MI and this may have caused it. Doppler unremarkable, no ectopy on telemetry. His echocardiogram showed right heart strain.   3. Non-ST elevation MI. His troponin has peaked at 7.7. I did talk to the cardiologist on-call who read his stress test and refer the whole story and he said unless the patient is having chest pain he would medically manage him. Patient is already on an aspirin, beta blocker, which I increased, and statin and he did not recommend long-acting nitrate in the absence of chest pain. He has been on Lovenox which I stopped after 3 days. Fasting lipid profile looked okay. His hydrochlorothiazide and ACE inhibitor are on hold due to his renal failure. Patient has never had any chest pain.  4. Lower extremity cellulitis. Likely source was an ingrown toenail. He is a diabetic and I think he is doing better on Levaquin renally dose adjusted to every 48 hours. We are also elevating his leg. Wound care did  get cultures which are no growth to date at 3 days  so doubt they well.  5. Acute kidney injury on chronic kidney disease stage III, secondary to contrast nephropathy. Creatinine is up to 5.1 today. I asked nephrology to see him and they're trying another dose of furosemide today.  6. Hyperkalemia. He was given a dose of Kayexalate but I expect only much better tomorrow after that and Lasix.  7. Diabetes type 2. Blood sugars remain mostly over 200. I will increase his Lantus again. His metformin and Amaryl are on hold  7. Ingrown toenail. This could be the source of his cellulitis.  8. Edema. He is up almost 5 L since admission. He was only on hydrochlorothiazide at home. He has not given back about 3 L since we started Lasix but I expect he has at least 10 more.    LOS 6 days  Total time spent 21 minutes.  Electronically signed by: Iantha Fallen Sanford1/31/20185:58 PM  This dictation was produced using voice recognition software. Despite concurrent proofreading, please note that homonyms and other transcription errors may be present and that these errors may not truly reflect my intent.

## 2016-07-18 NOTE — Progress Notes (Signed)
Patient to transfer to Rm [id number]. Patient made aware. Report given to Darel Hong, Charity fundraiser. All belongings checked and medications sent to floor.

## 2016-07-18 NOTE — H&P (Signed)
History and Physical      Name: Evan [name]  DOB: 1945-07-04 71 y.o.  MRN: [id number]  CSN: [id number]      History of Present Illness:    71 year old gentleman with history of hypertension, COPD, diabetes, who presented with erythema and tenderness of his left lower extremity. He was also having shortness of breath and had a syncopal episode prior to admission. He was seen by EMS and found to be hypoxic. His initial labs showed Creatinine of 1.8 which is higher than baseline and lactic acidosis. He received contrast on January 26 and his creatinine increased to 3.7 the next day and has continued to increase since then. His most recent creatinine is 5.1. He continues to have a good urine output. However he has been having worsening edema. He also had positive blood cultures on January 25.  He is currently on Levaquin.      t Medical History:  Past Medical History:   Diagnosis Date   . COPD (chronic obstructive pulmonary disease) (CMS/HCC)    . Diabetes mellitus, type II (CMS/HCC)    . Hypercholesteremia    . Hypertension    . Obesity, morbid (CMS/HCC)      Past Surgical History:  History reviewed. No pertinent surgical history.  Current Medications:  Prior to Admission medications    Medication Sig Start Date End Date Taking? Authorizing Provider   albuterol (PROAIR HFA) 90 mcg/actuation inhaler INHALE ONE PUFF BY MOUTH EVERY 4 HOURS AS NEEDED 01/22/14  Yes Historical Provider, MD   allopurinol (ZYLOPRIM) 300 MG tablet 300 mg. 12/01/15  Yes Historical Provider, MD   aspirin 81 MG EC tablet 81 mg. 11/12/07  Yes Historical Provider, MD   atorvastatin (LIPITOR) 20 MG tablet TAKE ONE TABLET BY MOUTH EVERY EVENING 01/11/16  Yes Historical Provider, MD   glimepiride (AMARYL) 4 MG tablet TAKE TWO TABLETS BY MOUTH EVERY MORNING BEFORE BREAKFAST 03/13/16  Yes Historical Provider, MD   hydroCHLOROthiazide (HYDRODIURIL) 25 MG tablet TAKE ONE TABLET BY MOUTH EVERY DAY 03/21/16  Yes Historical Provider, MD   insulin glargine  (LANTUS) 100 unit/mL injection Inject 10 Units under the skin nightly. Increase by 2 units every 3 days to target fasting blood glucose of 120. 07/09/16  Yes Historical Provider, MD   liraglutide (VICTOZA 2-PAK) 0.6 mg/0.1 mL (18 mg/3 mL) PnIj PEN Inject 1.2 mg under the skin daily.   Yes Historical Provider, MD   lisinopril (PRINIVIL,ZESTRIL) 40 MG tablet TAKE ONE TABLET BY MOUTH EVERY DAY 04/13/16  Yes Historical Provider, MD   metFORMIN (GLUCOPHAGE) 1000 MG tablet TAKE ONE TABLET BY MOUTH TWICE A DAY 03/21/16  Yes Historical Provider, MD   metoprolol tartrate (LOPRESSOR) 100 MG tablet TAKE ONE TABLET BY MOUTH TWICE A DAY 05/01/16  Yes Historical Provider, MD   spironolactone (ALDACTONE) 25 MG tablet TAKE TWO TABLETS BY MOUTH EVERY DAY 12/27/15  Yes Historical Provider, MD   testosterone (ANDROGEL) 12.5 mg/ 1.25 gram (1 %) GlPm Apply 4 Act topically Daily. 06/06/16  Yes Historical Provider, MD   cyanocobalamin (VITAMIN B-12) 1000 MCG tablet Take 1,000 mcg by mouth 2 times daily.      Historical Provider, MD   triamcinolone (KENALOG) 0.1 % cream Apply  topically 2 times daily.    Historical Provider, MD          Current Facility-Administered Medications:   .  acetaminophen (TYLENOL) tablet 650 mg, 650 mg, Oral, Q4H PRN, Letta Kocher Hirni, DO, 650 mg at 07/13/16 2003  .  aspirin EC tablet 81 mg, 81 mg, Oral, Daily, Mindi Slicker, MD, 81 mg at 07/18/16 0929  .  atorvastatin (LIPITOR) tablet 20 mg, 20 mg, Oral, QPM, Elizabeth Barbara Hirni, DO, 20 mg at 07/17/16 1750  .  benzocaine-menthol (CEPACOL) lozenge 1 lozenge, 1 lozenge, Oral, Q2H PRN, Mindi Slicker, MD, 1 lozenge at 07/17/16 2058  .  bisacodyl (DULCOLAX) suppository 10 mg, 10 mg, Rectal, Daily PRN, Mindi Slicker, MD  .  dextrose (GLUCOSE SHOT) oral liquid 15 g, 15 g, Oral, PRN, Mindi Slicker, MD  .  dextrose (GLUCOSE SHOT) oral liquid 30 g, 30 g, Oral, PRN, Mindi Slicker, MD  .  dextrose 50 % syringe  12.5-25 g, 25-50 mL, Intravenous, PRN, Mindi Slicker, MD  .  glucagon (human recombinant) injection 1 mg, 1 mg, Intramuscular, PRN, Mindi Slicker, MD  .  guaiFENesin [facility]) 12 hr tablet 1,200 mg, 1,200 mg, Oral, 2 times per day, Consuelo Pandy, MD, 1,200 mg at 07/18/16 0930  .  heparin, porcine (PF) syringe 5,000 Units, 5,000 Units, Subcutaneous, Q8H SCH, Consuelo Pandy, MD, 5,000 Units at 07/18/16 [phone]  .  HYDROcodone-acetaminophen (NORCO) 5-325 mg per tablet 1 tablet, 1 tablet, Oral, Q6H PRN, Mindi Slicker, MD, 1 tablet at 07/18/16 [phone]  .  insulin aspart (NovoLOG) PEN, , Subcutaneous, 4x Daily - AC and HS, Mindi Slicker, MD, 6 Units at 07/18/16 1201  .  insulin aspart (NovoLOG) PEN, , Subcutaneous, TID with meals, Consuelo Pandy, MD, Stopped at 07/18/16 1201  .  insulin glargine (LANTUS) injection 32 Units, 32 Units, Subcutaneous, Nightly, Consuelo Pandy, MD, 32 Units at 07/17/16 2058  .  levofloxacin (LEVAQUIN) 750 mg in D5W IVPB Premix, 750 mg, Intravenous, Q48H, Consuelo Pandy, MD, Stopped at 07/16/16 1811  .  magnesium hydroxide (MILK OF MAGNESIA) 400 mg/5 mL oral suspension 30 mL, 30 mL, Oral, Q12H PRN, Mindi Slicker, MD  .  melatonin tablet 3 mg, 3 mg, Oral, Nightly PRN, Mindi Slicker, MD, 3 mg at 07/15/16 2149  .  metoprolol tartrate (LOPRESSOR) tablet 150 mg, 150 mg, Oral, 2 times per day, Consuelo Pandy, MD, 150 mg at 07/18/16 0930  .  nicotine polacrilex (NICORETTE) gum 2 mg, 2 mg, Oral, PRN, Mindi Slicker, MD  .  phenol (CHLORASEPTIC) 1.4 % throat spray 1-2 spray, 1-2 spray, Mouth/Throat, Q2H PRN, Mindi Slicker, MD  .  polyethylene glycol [facility]) packet 17 g, 17 g, Oral, Daily, Consuelo Pandy, MD  .  polyvinyl alcohol (LIQUIFILM TEARS) 1.4 % ophthalmic solution 1-2 drop, 1-2 drop, Both Eyes, PRN, Mindi Slicker, MD  .  regadenoson [facility]) syringe 0.4 mg, 0.4 mg, Intravenous, Once PRN,  Consuelo Pandy, MD, 0.4 mg at 07/14/16 0747  .  senna-docusate (PERICOLACE) 8.6-50 mg per tablet 2 tablet, 2 tablet, Oral, 2 times per day, Consuelo Pandy, MD, 2 tablet at 07/18/16 0929  .  sodium chloride 0.9 % (NS) syringe 5-10 mL, 5-10 mL, Intravenous, Q8H SCH, Alwyn Ren, DO, 10 mL at 07/17/16 2100  .  sodium chloride 0.9 % (NS) syringe 5-10 mL, 5-10 mL, Intravenous, PRN, Alwyn Ren, DO, 10 mL at 07/17/16 1751  .  urea (CARMOL) 10 % cream, , Topical, PRN, Letta Kocher Hirni, DO    Allergies:  Patient has no known allergies.    Family History:  History reviewed. No pertinent family history.  Social History:  Social History   Substance Use Topics   . Smoking status: Never  Smoker   . Smokeless tobacco: Never Used   . Alcohol use No     Immunization:    There is no immunization history on file for this patient.  Review of Systems:       Constitutional: Chills  HEENT: Negative  Respiratory: Cough  Cardiovascular: Leg swelling  Gastrointestinal: No abdominal pain  Genitourinary: No dysuria  Musculoskeletal: Left lower extremity redness      Physical Exam:     Vital Signs:  Initial Vitals [07/11/16 1938]   BP 132/69   Pulse 105   Resp (!) 26   Temp 38 ?C (100.4 ?F)   SpO2 (!) 88 %     Exam:    Alert and oriented  Lungs clear bilaterally  Regular rate and rhythm  Abdomen nontender, nondistended  Musculoskeletal: 2+ edema bilaterally  Skin: Erythema on the left lower leg     Data:     Labs:  Results for orders placed or performed during the hospital encounter of 07/11/16 (from the past 24 hour(s))   POCT Glucose -Next Routine    Collection Time: 07/17/16  4:24 PM   Result Value Ref Range    POC Glucose 229 (H) 80 - 99 mg/dL   POCT Glucose -Next Routine    Collection Time: 07/17/16  9:32 PM   Result Value Ref Range    POC Glucose 251 (H) 80 - 99 mg/dL   Renal Function Panel -Daily    Collection Time: 07/18/16  3:56 AM   Result Value Ref Range    Albumin 3.0 (L) 3.5 - 5.0 g/dL    Calcium 8.4 (L) 8.6 - 10.3 mg/dL     Phosphorus 5.7 (H) 2.5 - 4.5 mg/dL    BUN 99 (H) 6 - 23 mg/dL    Creatinine 2.13 (H) 0.65 - 1.30 mg/dL    Sodium 086 578 - 469 mmol/L    Glucose 182 (H) 80 - 99 mg/dL    Potassium 5.3 (H) 3.5 - 5.1 mmol/L    Chloride 106 98 - 111 mmol/L    CO2 - Carbon Dioxide 20.0 (L) 21.0 - 31.0 mmol/L    Anion Gap 11.0 3.0 - 11.0 mmol/L    GFR Estimate 11 (L) >=60 mL/min/1.30m*2    GFR Additional Info     POCT Glucose -Next Routine    Collection Time: 07/18/16  6:25 AM   Result Value Ref Range    POC Glucose 184 (H) 80 - 99 mg/dL   POCT Glucose -Next Routine    Collection Time: 07/18/16 11:16 AM   Result Value Ref Range    POC Glucose 262 (H) 80 - 99 mg/dL          Imaging for last 24 hours:  No results found.    Special Studies:        Assessment and plan:     Diagnoses:  Active Hospital Problems    Diagnosis SNOMED CT(R) Date Noted   . Cellulitis CELLULITIS 07/12/2016   . Weakness generalized ASTHENIA 07/12/2016   . Cellulitis of left lower extremity CELLULITIS OF LEFT LOWER LIMB 07/12/2016   . Syncope and collapse SYNCOPE AND COLLAPSE 07/12/2016   . Fall, initial encounter FALL 07/12/2016   . Elevated troponin HIGH TROPONIN I LEVEL 07/12/2016   . Uncontrolled type 2 diabetes mellitus with stage 3 chronic kidney disease, with long-term current use of insulin (CMS/HCC) TYPE 2 DIABETES MELLITUS 07/12/2016   . COPD exacerbation (CMS/HCC) ACUTE EXACERBATION OF CHRONIC OBSTRUCTIVE AIRWAYS DISEASE 07/12/2016   .  Sepsis due to undetermined organism (CMS/HCC) SEPSIS 07/12/2016   . Fever FEVER 07/12/2016     71 year old gentleman with history of hypertension, COPD, diabetes, who presented with erythema and tenderness of his left lower extremity.    Acute kidney injury:  Most likely represents contrast nephropathy secondary to contrast exposure.  Baseline creatinine is around 1.5, was 1.8 on admission  Most recent creatinine is 5.1 and seems to have plateaued around this range for the past 3 days  He was initially on IV fluids and then  they were discontinued and he was given a dose of Lasix  At this point I don't think that fluids will reverse the contrast nephropathy.      Hypervolemia:  I will give another dose of Lasix a day and monitor if the kidney function worsens again or not      Hyperkalemia:  Which recent potassium is 5.3  I'll give a small dose of Kayexalate        Zoltan Genest Abou-Ismail  07/18/2016  1:38 PM

## 2016-07-18 NOTE — Progress Notes (Signed)
Assumed care of patient @ 1545. Patient AOx4. VSS. Very pleasant. 2+ Lower extremity edema. LEFT lower extremity red and warm to the touch, elevated on pillow. Denies pain. K 5.3, sodium polystyrene given.

## 2016-07-19 LAB — POTASSIUM: Potassium: 4.8 mmol/L (ref 3.5–5.1)

## 2016-07-19 LAB — RENAL FUNCTION PANEL
Albumin: 2.9 g/dL — ABNORMAL LOW (ref 3.5–5.0)
Anion Gap: 13 mmol/L — ABNORMAL HIGH (ref 3.0–11.0)
BUN: 105 mg/dL — ABNORMAL HIGH (ref 6–23)
CO2 - Carbon Dioxide: 18 mmol/L — ABNORMAL LOW (ref 21.0–31.0)
Calcium: 8.4 mg/dL — ABNORMAL LOW (ref 8.6–10.3)
Chloride: 107 mmol/L (ref 98–111)
Creatinine: 4.99 mg/dL — ABNORMAL HIGH (ref 0.65–1.30)
GFR Estimate: 12 mL/min/{1.73_m2} — ABNORMAL LOW (ref >=60)
Glucose: 192 mg/dL — ABNORMAL HIGH (ref 80–99)
Phosphorus: 6.1 mg/dL — ABNORMAL HIGH (ref 2.5–4.5)
Potassium: 5.2 mmol/L — ABNORMAL HIGH (ref 3.5–5.1)
Sodium: 138 mmol/L (ref 135–143)

## 2016-07-19 LAB — POCT GLUCOSE
POC Glucose: 166 mg/dL — ABNORMAL HIGH (ref 80–99)
POC Glucose: 226 mg/dL — ABNORMAL HIGH (ref 80–99)
POC Glucose: 233 mg/dL — ABNORMAL HIGH (ref 80–99)
POC Glucose: 250 mg/dL — ABNORMAL HIGH (ref 80–99)

## 2016-07-19 MED ORDER — insulin glargine (LANTUS) injection 40 Units
100 | Freq: Every evening | SUBCUTANEOUS | Status: DC
Start: 2016-07-19 — End: 2016-07-27
  Administered 2016-07-19 – 2016-07-27 (×9): 100 [IU] via SUBCUTANEOUS

## 2016-07-19 MED ORDER — furosemide (LASIX) injection 20 mg
10 | Freq: Once | INTRAMUSCULAR | Status: AC
Start: 2016-07-19 — End: 2016-07-19
  Administered 2016-07-19: 22:00:00 10 mg via INTRAVENOUS

## 2016-07-19 MED FILL — FUROSEMIDE 10 MG/ML INJECTION SOLUTION: 10 mg/mL | INTRAMUSCULAR | Qty: 2

## 2016-07-19 MED FILL — LANTUS U-100 INSULIN 100 UNIT/ML SUBCUTANEOUS SOLUTION: 100 [IU]/mL | SUBCUTANEOUS | Qty: 0.4

## 2016-07-19 MED FILL — LANTUS U-100 INSULIN 100 UNIT/ML SUBCUTANEOUS SOLUTION: 100 [IU]/mL | SUBCUTANEOUS | Qty: 0.32

## 2016-07-19 MED FILL — MIRALAX 17 GRAM ORAL POWDER PACKET: 17 g | ORAL | Qty: 1

## 2016-07-19 MED FILL — SENNA PLUS 8.6 MG-50 MG TABLET: 8.6-50 mg | ORAL | Qty: 2

## 2016-07-19 MED FILL — MUCINEX 600 MG TABLET, EXTENDED RELEASE: 600 mg | ORAL | Qty: 2

## 2016-07-19 MED FILL — METOPROLOL TARTRATE 50 MG TABLET: 50 mg | ORAL | Qty: 3

## 2016-07-19 MED FILL — ASPIRIN 81 MG TABLET,ENTERIC COATED: 81 mg | ORAL | Qty: 1

## 2016-07-19 MED FILL — HEPARIN, PORCINE (PF) 5,000 UNIT/0.5 ML SYRINGE: 5000 unit/0.5 mL | INTRAMUSCULAR | Qty: 0.5

## 2016-07-19 MED FILL — ATORVASTATIN 20 MG TABLET: 20 mg | ORAL | Qty: 1

## 2016-07-19 NOTE — Progress Notes (Signed)
End of Shift Report     Significant Events Pt seen by wound care nurse, added instructions for MWF changes by floor nurse.     Relevant   Assessment Findings Pt on Mosmo, Sats in 90s on RM and HR WDL.      Patient/Family  Questions/Concerns None stated.     DC Barriers Clinical Improvement

## 2016-07-19 NOTE — Discharge Planning (AHS/AVS) (Signed)
.    DISCHARGE PLANNING ASSESSMENT     Primary Care Physician: SAMUEL [name] Confirmed Pt's PCP and Contact info:   Yes     Plan: TBD, pt will s/w wife and daughter Christin Bach RN who will be staying with pt and wife for at least a week re Home health vs snf, he will ask Tamela to check into cost of 4WW as doesn't want FWW and doesn't want to run through insurance.    Patient/Family Goal: home with family   Family or Caregiver who is to be included in care planning: wife Arline or daughter if present  ASSESSMENT   Goals of Care conversation:    Needs to be Addressed by PCP on Next Visit: No     Current General Observations/Barriers:alert and oriented/ medical mgt, edema, kidney function, wounds    Prior Level of Function  Potential Risk Factors: Change in care needs, Severity of injury/illness  Home Environment: Single level  Mentation: Oriented  Mobility: Ambulatory, Independent  DME: Scientist, forensic (Current)  Living Arrangements: Spouse/significant other  Type of Residence: Private residence  Support Systems: Spouse/significant other, Children           INTERVENTIONS   Anticipated additional equipment needs: walker  Do you anticipate patient will need medical transport at discharge?: family to transport if home       Additional Information: S/w pt re d/c options, offered snf, pt will consider, gave snf and home health agency forms and discussed both options.    Sonam Wandel K. Cambrea Kirt, RN

## 2016-07-19 NOTE — Treatment Summary (Signed)
IP PT TREATMENT NOTE     Patient:  Evan [name] / [id number] DOB:  1946-01-18 / 71 y.o. Date:  07/19/2016     Precautions  Specific mobility precautions: None     ASSESSMENT     Summary:  Pt willing to walk, gait with FWW ~20' CG- SBA, standing rest and back to chair  Gait again ~ 12' FWW, to bed  Mod A B LEs BTB     Able to be up with Nursing Team (x 1).;Able to be up with Nursing Team (x 2).     Discharge recommendations  Mobility Equipment needs: Front wheeled walker (FWW) (bariatric)   Primary recommendation : Patient would benefit from SNF/Nursing Home/Other Skilled Facility.  Recommended transport method: Wheelchair  Secondary recommendation: Patient would benefit from Home Health PT.  Recommended transport method: Wheelchair  Justification: Progressive gait, balance, strengthening and mobility activities for safety and independence with retun to home      PLAN     Treatment Plan Frequency    Plan: Continue per established POC.  Pt. demonstrates limitations which require skilled PT intervention.   .  Frequency will be increased as pt's condition or discharge plan indicates.     Adonys Hilton Sinclair will remain on the Physical Therapy schedule until goals are met, or there has been a change in the Plan of Care.          Treatment Diagnosis Surgery / # Days Post-op (if applicable)    Primary Dx: Sepsis, cellulitis  Treatment Dx: Impaired mobility   /         SUBJECTIVE   Pt up in chair, agrees to PT     Pain Level  Current status: Pain does not limit pt's ability to participate in therapy  Pain at start of session: 0 - No Pain  Pain at end of session: 0 - No Pain  Pain at rest: 0 - No Pain     OBJECTIVE   Strengthening, safe mobility     Mental status  General Demeanor: Pleasant;Cooperative  LOC: Alert  Orientation: Oriented x 4  --> Follows multi-step commands: Consistently  --> Follows one-step commands: Consistently  Attention span: Appears intact  Memory: Appears intact in social/therapy situations          TREATMENT  Bed Mobility/Transfers  Bed mobility       Rolling / logroll        Transition to sitting up     To resting position      Transition to sitting down CGA;Verbal cues     Transition to supine       Scooting     Transfers      Transition to standing From chair: CGA;Verbal cues     Method Stand pivot: CGA;SBA;Verbal cues      Gait/Stairs  Mobility - Gait (Level)  Gait (distance): 22 feet  Gait (assist level): CGA;SBA;Verbal cues  Assistive device: Front wheeled walker (FWW)  Endurance: poor  Pace: slow, labored  Pattern: Wide BOS;Increased upper body tension;Guarded;Decreased foot clearance L;Decreased foot clearance R  Surface: Level tile    Ther. Ex.  Therapeutic Exercises  Seated long arc quads: 8   Training/Education      Safety/Room Set-up   Call button accessible?: Yes  Phone accessible?: Yes  Oxygen reconnected?: No, on room air  Patient mobility in room: Able to be up with Nursing Team (x 1).;Able to be up with Nursing Team (x 2).  Position on arrival: In  regular chair  Position on departure (bed): Bed in lowest position;Sitter/Family present (wife present)     GOALS     Patient/Caregiver goals reviewed and integrated with rehab treatment plan:      Multidisciplinary Problems (Active)        Problem: IP General Goal List - 2    Goal Priority Disciplines Outcome   Bed Mobility     PT    Description:  Pt. to perform bed mobility with min assist.    Transfers     PT    Description:  Pt. to perform all functional transfers with min. assist.    Ambulation - Levels     PT    Description:  Pt. to ambulate with min assist x 50 feet with appropriate assistive device.    Ambulation - Stairs     PT    Description:  Pt. to ascend/descend stairs with min assist, as per discharge environment.    Home Exercise Program     PT    Description:  Pt. to perform basic HEP with min assist.    Caregiver Training     PT    Description:  Patient/caregiver training will be provided, as needed to achieve goals.              Problem: Patient/Family Goal    Goal Priority Disciplines Outcome   Normal Status     PT    Description:  Pt. wants to return to previous level of function.     Home     PT    Description:  Pt. wants to return to previous living situation.    Mobility     PT    Description:  Patient wants to walk again.                      G-Codes/PSFS/FIM Scores: (if applicable)          First session   Start/Stop times: 1350 - 1421   Total time: 31 minutes      This note to serve as a Discharge Summary if Evan [name] is discharged from the hospital or from therapy services.     Therapist:  Heloise Beecham, PTA

## 2016-07-19 NOTE — Progress Notes (Signed)
End of Shift Report     Significant Events      Relevant   Assessment Findings Pt transferred to our floor. Appeared in no distress. Pt brought in bari bed. Bari chair put  In room.   LLQ dressing CDI      Patient/Family  Questions/Concerns Non voiced     DC Barriers    General Medicine Patients       Is the patient voiding or having normal catheter output? yes      Is the patient eating/drinking adequately? yes      Last bowel movement?   07/18/16      What is the stability of patient?  vss      How is pain managed and controlled? (last dose) No c/o pain       Calls made to patient care provider   None       New orders related to changes in patient condition?  none       Miscellaneous information from the shift    Dialysis Patients        When was last dialysis and were there any issues?       When is next dialysis planned any special instructions?

## 2016-07-19 NOTE — Progress Notes (Signed)
Inpatient Nephrology Progress Note     Name: Evan [name]  DOB: [birth year] 71 y.o.  MRN: [id number]  CSN: [id number]  PCP: Marissa Nestle    Subjective:   71 year old gentleman with history of hypertension, COPD, diabetes, who presented with erythema and tenderness of his left lower extremity. He was also having shortness of breath and had a syncopal episode prior to admission. He was seen by EMS and found to be hypoxic. His initial labs showed Creatinine of 1.8 which is higher than baseline and lactic acidosis. He received contrast on January 26 and his creatinine increased to 3.7 the next day and has continued to increase since then. His most recent creatinine is 5.1. He continues to have a good urine output. However he has been having worsening edema. He also had positive blood cultures on January 25.  He is currently on Levaquin.    24 h events:  4 L of urine output  Creatinine down to 4.9      Objective:   Vital Signs:    Temp: 36.9 ?C (98.4 ?F) BP: 120/60 Pulse: 73 Resp: 18 SpO2: 96 % on O2 Flow Rate (L/min): 2 L/min None (Room air)   Min/Max Temp past 24 hours:Temp  Avg: 36.9 ?C (98.5 ?F)  Min: 36.7 ?C (98 ?F)  Max: 37.2 ?C (98.9 ?F)  Weight most recent: Weight: (!) 177.6 kg (391 lb 8.6 oz)     INTAKE/OUTPUT    Intake/Output Summary (Last 24 hours) at 07/19/16 1339  Last data filed at 07/19/16 1132   Gross per 24 hour   Intake              855 ml   Output             5300 ml   Net            -4445 ml     SUGARS:   POC Glucose   Date/Time Value Ref Range Status   07/19/2016 11:15 AM 250 (H) 80 - 99 mg/dL Final   56/21/3086 57:84 AM 166 (H) 80 - 99 mg/dL Final   69/62/9528 41:32 PM 233 (H) 80 - 99 mg/dL Final   44/06/270 53:66 PM 226 (H) 80 - 99 mg/dL Final   44/08/4740 59:56 AM 262 (H) 80 - 99 mg/dL Final       . aspirin  81 mg Oral Daily   . atorvastatin  20 mg Oral QPM   . furosemide  20 mg Intravenous Once   . guaiFENesin  1,200 mg Oral 2 times per day   . heparin  5,000 Units Subcutaneous Q8H SCH     . insulin aspart   Subcutaneous 4x Daily - AC and HS   . insulin aspart   Subcutaneous TID with meals   . insulin glargine  40 Units Subcutaneous Nightly   . levofloxacin (LEVAQUIN) IV  750 mg Intravenous Q48H   . metoprolol tartrate  150 mg Oral 2 times per day   . polyethylene glycol  17 g Oral Daily   . senna-docusate  2 tablet Oral 2 times per day   . sodium chloride 0.9 % (NS) syringe  5-10 mL Intravenous Q8H [facility]         Physical Exam:  Alert and oriented  Lungs clear bilaterally  Regular rate and rhythm  Abdomen nontender, nondistended  Musculoskeletal: 2+ edema bilaterally  Skin: Erythema on the left lower leg     Recent Labs:  Results for orders placed  or performed during the hospital encounter of 07/11/16 (from the past 24 hour(s))   POCT Glucose -Next Routine    Collection Time: 07/18/16  4:08 PM   Result Value Ref Range    POC Glucose 226 (H) 80 - 99 mg/dL   POCT Glucose -Next Routine    Collection Time: 07/18/16  8:46 PM   Result Value Ref Range    POC Glucose 233 (H) 80 - 99 mg/dL   Potassium -Timed    Collection Time: 07/18/16 10:19 PM   Result Value Ref Range    Potassium 4.8 3.5 - 5.1 mmol/L   Renal Function Panel -Daily    Collection Time: 07/19/16  4:42 AM   Result Value Ref Range    Albumin 2.9 (L) 3.5 - 5.0 g/dL    Calcium 8.4 (L) 8.6 - 10.3 mg/dL    Phosphorus 6.1 (H) 2.5 - 4.5 mg/dL    BUN 308 (H) 6 - 23 mg/dL    Creatinine 6.57 (H) 0.65 - 1.30 mg/dL    Sodium 846 962 - 952 mmol/L    Glucose 192 (H) 80 - 99 mg/dL    Potassium 5.2 (H) 3.5 - 5.1 mmol/L    Chloride 107 98 - 111 mmol/L    CO2 - Carbon Dioxide 18.0 (L) 21.0 - 31.0 mmol/L    Anion Gap 13.0 (H) 3.0 - 11.0 mmol/L    GFR Estimate 12 (L) >=60 mL/min/1.47m*2    GFR Additional Info     POCT Glucose -Next Routine    Collection Time: 07/19/16  6:53 AM   Result Value Ref Range    POC Glucose 166 (H) 80 - 99 mg/dL   POCT Glucose -Next Routine    Collection Time: 07/19/16 11:15 AM   Result Value Ref Range    POC Glucose 250 (H) 80 - 99 mg/dL        Recent Imaging:  No results found.    Assessment and plan:     71 year old gentleman with history of hypertension, COPD, diabetes, who presented with erythema and tenderness of his left lower extremity.  ?  Acute kidney injury:  Most likely represents contrast nephropathy secondary to contrast exposure.  Baseline creatinine is around 1.5, was 1.8 on admission  Peaked at 5.1  Today improved to 4.9  Plan for another dose of lasix today.  ?  ?  Hypervolemia:  Improving with diuresis  ?  ?  Hyperkalemia:  Which recent potassium is 5.2  Improve a little since yesterday    Plan of care was discussed with patient.      Evan [name]  [facility]  07/19/2016

## 2016-07-19 NOTE — Progress Notes (Addendum)
Wound Care Note:  Follow up for LLE anterior wound. Patient has new weeping area on posterior, medial aspect.  Patient name: Evan [name] Room: 6477/6477-01 Time: 1:52 PM   Date of Birth: 1945-06-21  Date: 07/19/2016     Gender: male   MRN: [id number]   Attending: Consuelo Pandy, MD     Lift Assist: 1 assist          Anatomical Location: LLE anterior and posterior medial.  Wound Type/Etiology: Cellulitis, diabetic        07/19/16 1300   Wounds and Other Ulcers 07/15/16  Leg Left;Anterior   Date First Assessed: 07/15/16   Wound Type: (c)   Location: Leg  Wound Location Orientation: Left;Anterior  Pre-existing: Yes   State of Healing Non-healing   Wound Assessment Fibrin;Friable;Slough   Length (longest length head to toe) 4.1   Width (widest width perpendicular to length) 3.3   Depth (deepest part of visible wound) 0.1   Shape irregular   Edges / Margins indistinct, diffuse;not attached   Peri-wound Assessment erythemic;macerated   Drainage Amount Large   Drainage Description Serous   Closure None   Treatments Cleansed;Debrided: Mechanical   Dressing ABD;Elastic bandage or wrap;Gauze, rolled;Moisture barrier cream, antifungal;Silver dressing  (Tubigrip G)   Dressing Status Dressing Changed   Wounds and Other Ulcers 07/19/16  Leg Left;Posterior;Medial   Date First Assessed/Time First Assessed: 07/19/16 1347   Wound Type: (c)   Location: Leg  Wound Location Orientation: Left;Posterior;Medial  Pre-existing: No   State of Healing Non-healing   Wound Assessment Fibrin;Friable;Slough   Length (longest length head to toe) 3.5  (3.5)   Width (widest width perpendicular to length) 1.8   Depth (deepest part of visible wound) 0.1   Shape linear   Edges / Margins indistinct, diffuse;not attached   Peri-wound Assessment erythemic;macerated   Drainage Amount Moderate   Drainage Description Serous   Closure None   Treatments Cleansed;Debrided: Mechanical   Dressing ABD;Elastic bandage or wrap;Gauze, rolled;Moisture barrier cream,  antifungal;Silver dressing   Dressing Status Dressing Changed     History/Wound or Surgery History /Comorbidities: History reviewed from chart documentation and patient or family interview.       Admitting Dx: Syncope and collapse [R55];Fever, unknown origin [R50.9];Weakness generalized [R53.1];Hyperglycemia [R73.9];Elevated troponin [R74.8];COPD exacerbation (CMS/HCC) [J44.1];Fever [R50.9];Cellulitis of left lower extremity [L03.116];Fall, initial encounter [W19.XXXA];Sepsis due to undetermined organism (CMS/HCC) [A41.9]   Hospital Problem List as of 07/19/2016       Hospital    Fever    Cellulitis    Weakness generalized    Cellulitis of left lower extremity    Syncope and collapse    Fall, initial encounter    Elevated troponin    Uncontrolled type 2 diabetes mellitus with stage 3 chronic kidney disease, with long-term current use of insulin (CMS/HCC)    COPD exacerbation (CMS/HCC)    Sepsis due to undetermined organism (CMS/HCC)                   Allergies: No Known Allergies    On appropriate surface? Yes Compella bed  Braden Scale:   Sensory Perceptions: Slightly limited  Moisture: Occasionally moist  Activity: Walks occasionally  Mobility: Slightly limited  Nutrition: Adequate  Friction and Shear: Potential problem  Braden Scale Score: 17    Labs:   Lab Results   Component Value Date    WHITEBLOODCE 10.4 07/15/2016    WHITEBLOODCE 14.6 (H) 07/14/2016    WHITEBLOODCE 20.0 (H) 07/13/2016  HGB 11.0 (L) 07/15/2016    HGB 11.0 (L) 07/14/2016    HGB 11.6 (L) 07/13/2016    HCT 32.6 (L) 07/15/2016    HCT 32.8 (L) 07/14/2016    HCT 35.2 (L) 07/13/2016    MCV 100.0 (H) 07/15/2016    MCV 101.0 (H) 07/14/2016    MCV 100.4 (H) 07/13/2016    LABPLAT 152 07/15/2016    LABPLAT 143 (L) 07/14/2016    LABPLAT 141 (L) 07/13/2016       No results for input(s): SEDRATE in the last 72 hours.  Recent Labs      07/19/16   0442   ALBUMIN  2.9*     No results for input(s): CRP in the last 72 hours.  Body mass index is 51.38  kg/m?[name]  Nutritional Status:  low albumin and High BMI, diabetic    Neuro Assessment: Patient is alert and oriented x 3, moves all extremities, and articulates needs well.   Patient demonstrated ability to reposition self in bed.  Patient requires one person assist to reposition in bed.    Fall Risk: Score: 60  Fall Risk: High  Precautions Observed     Isolation Status:  Standard precautions observed    Pain - Prior: 1  During: 1  After: 1  Patient complaining of itching  Family Present: none    Change in History: none    Last dressing change: 07/17/16    Debridement:   Mechanical: gauze was used to debride fibirn, slough loose epidermis from LLE anterior wound and posterior medial wound.      Treatment/Rationale: Dressing removed and Leg assessed. Continual oozing of serous drainage. Patient has new wound on LLE posterior medial form edema, maceration and fragile epithelium. Patient noted to have fungal skin changes on his feet and ankles. Patient is c/o itching. Slough, loose epidermis and debris removed from wounds. Feet and legs wsahed with wound cleanser. Phytoplex antifungal ointment applied to intact skin. Aqualcel extra applied to the wounds, covered with ABD pads and secured with Kerlix. Tibigrip G applied from toes to knee.    Other Modalities: Specialty mattress and Pillows      Education:    Who: patient      What reviewed: Keeping legs elevated   Pt understanding: Will need reinforcement.  Coordination of Care with: patient and nurse    Impression: No significant change in  Anterior wound. New posterior medial wound. Leg still oozing. Evidence of fungal dermatitis.    New wound 07/19/16        Pre-existing wound.    Recommendations:   Dressing changes to be done daily by bedside nurses.  Wound Care team to follow-up 1-2 times a week.      Billable Items: Two 4x5 Aquacel Ag extra. One applied, one at bedside for the floor nurses to apply. Tubigrip G  Remained of supplies form the floor stock.

## 2016-07-19 NOTE — Progress Notes (Signed)
Daily Progress Note   Name: Evan [name]  DOB: 21-Feb-1946 71 y.o.  MRN: [id number]  CSN: [id number]  PCP: Remi Deter [name]    Subjective:   Chief Complaint: sepsis left leg cellulitis    Patient is a very pleasant 71 year old male with a history of morbid obesity, hypertension, history of sleep apnea on C Pap, chronic kidney disease stage III, diabetes type 2 on insulin who presented with increasing weakness fevers and a syncopal episode. EMS was called and he was hypoxic and found to be septic with a lactic acid of 4. He had acute kidney injury with a creatinine up to 1.8 and an elevated troponin at 0.11. It is subsequently peaked at 7.7. Despite this he has never complained of chest pain. He has complained of feeling weak and having a cough and shortness of breath. He had a stress test which showed moderate probability of underlying coronary artery disease. They recommended medical management unless he had ongoing pain. His blood sugars have been elevated above 200 and we increased his lantus but they still remain elevated. He had an echocardiogram which showed right heart strain but does have a history of obstructive sleep apnea and obesity. He did have an ultrasound of his lower extremities which showed no evidence of DVT. A CT pulmonary angiogram showed no evidence of pulmonary emboli. Unfortunately it does look like he developed a contrast-induced nephropathy. We have had fluids on and off and it appears his creatinine Has plateaued at 5.1, currently 4.99. I did ask nephrology to see him yesterday and they did not feel fluids would help him anymore but recommended 20 mg of Lasix. He put out almost 4-1/2 L yesterday. January 25 the patient's blood culture turned positive with gram-positive cocci he was started on vancomycin but it looks like it was a staph epidermidis so it was stopped.    Patient's last fever was January 26 at 1954. January 27 his leg was more swollen and his rash looked worse. It started  leaking. Thinking he probably had a polymicrobial infection with his underlying diabetes we switched him from Rocephin to Levaquin. His leukocytosis has resolved and the wound looks much better today. The wound culture by Gram stain shows no bacteria and is no growth to date now at day 3 so unlikely to grown anything.    Patient did have a large liquid bowel movement last night. He is denying any chest pain, shortness of breath, nausea or vomiting    Objective:   Vital Signs:  BP: 120/60  Pulse: 73  Resp: 18  Temp: 36.9 ?C (98.4 ?F)  SpO2: 96 % (02/01 1117)  Intake/Ouput:    Intake/Output Summary (Last 24 hours) at 07/19/16 1243  Last data filed at 07/19/16 1132   Gross per 24 hour   Intake              855 ml   Output             5300 ml   Net            -4445 ml      Exam:  General: Patient is in no acute distress.  Pulmonary: Clear to auscultation bilaterally without wheezes rales or rhonchi.  Cardiovascular: Regular rate and rhythm without murmurs rubs or gallops.   GI: Normal active bowel sounds, soft, non-distended, nontender.  Musculoskeletal: No clubbing cyanosis Does have 2+ pitting bilateral lower extremity edema much more impressive on the left than right.  Dermatologic: His rash  is starting to coalesce and looks a lot better today.          Recent Labs:  Results for orders placed or performed during the hospital encounter of 07/11/16 (from the past 24 hour(s))   POCT Glucose -Next Routine    Collection Time: 07/18/16  4:08 PM   Result Value Ref Range    POC Glucose 226 (H) 80 - 99 mg/dL   POCT Glucose -Next Routine    Collection Time: 07/18/16  8:46 PM   Result Value Ref Range    POC Glucose 233 (H) 80 - 99 mg/dL   Potassium -Timed    Collection Time: 07/18/16 10:19 PM   Result Value Ref Range    Potassium 4.8 3.5 - 5.1 mmol/L   Renal Function Panel -Daily    Collection Time: 07/19/16  4:42 AM   Result Value Ref Range    Albumin 2.9 (L) 3.5 - 5.0 g/dL    Calcium 8.4 (L) 8.6 - 10.3 mg/dL    Phosphorus 6.1  (H) 2.5 - 4.5 mg/dL    BUN 956 (H) 6 - 23 mg/dL    Creatinine 2.13 (H) 0.65 - 1.30 mg/dL    Sodium 086 578 - 469 mmol/L    Glucose 192 (H) 80 - 99 mg/dL    Potassium 5.2 (H) 3.5 - 5.1 mmol/L    Chloride 107 98 - 111 mmol/L    CO2 - Carbon Dioxide 18.0 (L) 21.0 - 31.0 mmol/L    Anion Gap 13.0 (H) 3.0 - 11.0 mmol/L    GFR Estimate 12 (L) >=60 mL/min/1.44m*2    GFR Additional Info     POCT Glucose -Next Routine    Collection Time: 07/19/16  6:53 AM   Result Value Ref Range    POC Glucose 166 (H) 80 - 99 mg/dL   POCT Glucose -Next Routine    Collection Time: 07/19/16 11:15 AM   Result Value Ref Range    POC Glucose 250 (H) 80 - 99 mg/dL     Pending Labs     Order Current Status    Blood Culture -x 2 Preliminary result    Blood Culture -x 2 Preliminary result        Recent Imaging:  No results found.    Assessment & Plan:   1: Severe sepsis on presentation. Seems out of proportion to what I would expect from his cellulitis but has resolved.  2. Syncope. Unclear etiology. He did have a non-ST elevation MI and this may have caused it. Doppler unremarkable, no ectopy on telemetry. His echocardiogram showed right heart strain.   3. Non-ST elevation MI. His troponin has peaked at 7.7. I did talk to the cardiologist on-call who read his stress test and refer the whole story and he said unless the patient is having chest pain he would medically manage him. Patient is already on an aspirin, beta blocker, which I increased, possibly could go out more but with the diuresis he is having one to drop his blood pressure especially in the setting of acute kidney injury so wanted this time. We continued his statin but no long-acting nitrate in the absence of chest pain. He had been on Lovenox which I stopped after 3 days. Fasting lipid profile looked okay. His hydrochlorothiazide and ACE inhibitor are on hold due to his renal failure. Patient has never had any chest pain.  4. Lower extremity cellulitis. Likely source was an ingrown  toenail. He is a diabetic and I think  he is doing better on Levaquin renally dose adjusted to every 48 hours. We are also elevating his leg. Wound care did get cultures which are no growth to date at 3 days so doubt they well.  5. Acute kidney injury on chronic kidney disease stage III, secondary to contrast nephropathy. Creatinine is basically stable at 4.99 today. I asked nephrology to see him and they agreed with another dose of furosemide today.  6. Hyperkalemia. He did get a dose of Kayexalate yesterday and went down now back up to 5.2 I suspect will improve with furosemide.  7. Diabetes type 2. Patient was started on 10 units of Lantus 2 days before he came to the hospital. Last night I increased it to 40. His metformin and Amaryl are on hold. His sugars are still high I had him on carb coverage originally at one unit for every 15 g changed to 10 and today I will go to every 5.  7. Ingrown toenail. This could be the source of his cellulitis.  8. Edema. He was only on hydrochlorothiazide at home. No evidence of congestive heart failure on his echocardiogram. Likely fluid retention with his obesity and sleep apnea. He is now down a liter since admission with his excellent diuresis the last few days. He still looks volume overloaded although intravascularly dry. We'll get another 20 mg of Lasix today.    LOS 7 days  Total time spent 19 minutes.  Electronically signed by: Iantha Fallen Sanford2/1/201812:43 PM  This dictation was produced using voice recognition software. Despite concurrent proofreading, please note that homonyms and other transcription errors may be present and that these errors may not truly reflect my intent.

## 2016-07-20 LAB — RENAL FUNCTION PANEL
Albumin: 3 g/dL — ABNORMAL LOW (ref 3.5–5.0)
Anion Gap: 13.8 mmol/L — ABNORMAL HIGH (ref 3.0–11.0)
BUN: 108 mg/dL — ABNORMAL HIGH (ref 6–23)
CO2 - Carbon Dioxide: 17.2 mmol/L — ABNORMAL LOW (ref 21.0–31.0)
Calcium: 8.6 mg/dL (ref 8.6–10.3)
Chloride: 107 mmol/L (ref 98–111)
Creatinine: 4.83 mg/dL — ABNORMAL HIGH (ref 0.65–1.30)
GFR Estimate: 12 mL/min/{1.73_m2} — ABNORMAL LOW (ref >=60)
Glucose: 150 mg/dL — ABNORMAL HIGH (ref 80–99)
Phosphorus: 6.6 mg/dL — ABNORMAL HIGH (ref 2.5–4.5)
Potassium: 4.8 mmol/L (ref 3.5–5.1)
Sodium: 138 mmol/L (ref 135–143)

## 2016-07-20 LAB — POCT GLUCOSE
POC Glucose: 155 mg/dL — ABNORMAL HIGH (ref 80–99)
POC Glucose: 174 mg/dL — ABNORMAL HIGH (ref 80–99)
POC Glucose: 210 mg/dL — ABNORMAL HIGH (ref 80–99)
POC Glucose: 210 mg/dL — ABNORMAL HIGH (ref 80–99)

## 2016-07-20 MED ORDER — ampicillin-sulbactam (UNASYN) 3 g in sodium chloride 0.9 % (NS) 100 mL IVPB
3 | Freq: Two times a day (BID) | INTRAMUSCULAR | Status: DC
Start: 2016-07-20 — End: 2016-07-21
  Administered 2016-07-21 (×2): via INTRAVENOUS

## 2016-07-20 MED ORDER — ampicillin-sulbactam (UNASYN) 3 g in sodium chloride 0.9 % (NS) 100 mL IVPB
3 | Freq: Four times a day (QID) | INTRAMUSCULAR | Status: DC
Start: 2016-07-20 — End: 2016-07-20
  Administered 2016-07-20 – 2016-07-21 (×2): via INTRAVENOUS

## 2016-07-20 MED ORDER — predniSONE (DELTASONE) tablet 40 mg
20 | Freq: Every day | ORAL | Status: DC
Start: 2016-07-20 — End: 2016-07-21

## 2016-07-20 MED ORDER — diphenhydrAMINE (BENADRYL) capsule 25 mg
25 | ORAL | Status: DC | PRN
Start: 2016-07-20 — End: 2016-08-02
  Administered 2016-07-20 – 2016-08-02 (×23): 25 mg via ORAL

## 2016-07-20 MED FILL — MUCINEX 600 MG TABLET, EXTENDED RELEASE: 600 mg | ORAL | Qty: 2

## 2016-07-20 MED FILL — AMPICILLIN-SULBACTAM 3 GRAM SOLUTION FOR INJECTION: 3 g | INTRAMUSCULAR | Qty: 3

## 2016-07-20 MED FILL — ASPIRIN 81 MG TABLET,ENTERIC COATED: 81 mg | ORAL | Qty: 1

## 2016-07-20 MED FILL — DIPHENHYDRAMINE 25 MG CAPSULE: 25 mg | ORAL | Qty: 1

## 2016-07-20 MED FILL — HEPARIN, PORCINE (PF) 5,000 UNIT/0.5 ML SYRINGE: 5000 unit/0.5 mL | INTRAMUSCULAR | Qty: 0.5

## 2016-07-20 MED FILL — ATORVASTATIN 20 MG TABLET: 20 mg | ORAL | Qty: 1

## 2016-07-20 MED FILL — LEVOFLOXACIN IN D5W 750 MG/150 ML IVPB PREMIX: 750 mg/150 mL | INTRAVENOUS | Qty: 150

## 2016-07-20 MED FILL — METOPROLOL TARTRATE 50 MG TABLET: 50 mg | ORAL | Qty: 3

## 2016-07-20 MED FILL — LANTUS U-100 INSULIN 100 UNIT/ML SUBCUTANEOUS SOLUTION: 100 [IU]/mL | SUBCUTANEOUS | Qty: 0.4

## 2016-07-20 MED FILL — SENNA PLUS 8.6 MG-50 MG TABLET: 8.6-50 mg | ORAL | Qty: 2

## 2016-07-20 NOTE — Progress Notes (Signed)
Inpatient Nephrology Progress Note     Name: Evan [name]  DOB: 03/12/1946 71 y.o.  MRN: [id number]  CSN: [id number]  PCP: Marissa Nestle    Subjective:   71 year old gentleman with history of hypertension, COPD, diabetes, who presented with erythema and tenderness of his left lower extremity. He was also having shortness of breath and had a syncopal episode prior to admission. He was seen by EMS and found to be hypoxic. His initial labs showed Creatinine of 1.8 which is higher than baseline and lactic acidosis. He received contrast on January 26 and his creatinine increased to 3.7 the next day and has continued to increase since then. His most recent creatinine is 5.1. He continues to have a good urine output. However he has been having worsening edema. He also had positive blood cultures on January 25. He is currently on Levaquin. He had a troponin of 0.1 on admission which peaked at 7.7 before trending down. He had no chest pain during that time. Ultrasound of his legs showed no DVT. CTA showed no pulmonary embolus.    24 h events:  3 L of urine output  Creatinine down to 4.8      Objective:   Vital Signs:    Temp: 36.5 ?C (97.7 ?F) BP: 136/70 Pulse: 77 Resp: 22 SpO2: 96 % on O2 Flow Rate (L/min): 2 L/min None (Room air)   Min/Max Temp past 24 hours:Temp  Avg: 36.8 ?C (98.2 ?F)  Min: 36.5 ?C (97.7 ?F)  Max: 37.2 ?C (98.9 ?F)  Weight most recent: Weight: (!) 180.3 kg (397 lb 7.8 oz)     INTAKE/OUTPUT    Intake/Output Summary (Last 24 hours) at 07/20/16 1454  Last data filed at 07/20/16 1107   Gross per 24 hour   Intake             1720 ml   Output             2450 ml   Net             -730 ml     SUGARS:   POC Glucose   Date/Time Value Ref Range Status   07/20/2016 11:41 AM 210 (H) 80 - 99 mg/dL Final   95/62/1308 65:78 AM 155 (H) 80 - 99 mg/dL Final   46/96/2952 84:13 PM 174 (H) 80 - 99 mg/dL Final   24/40/1027 25:36 PM 210 (H) 80 - 99 mg/dL Final   64/40/3474 25:95 AM 250 (H) 80 - 99 mg/dL Final       .  ampicillin-sulbactam (UNASYN) IV  3 g Intravenous Q6H   . aspirin  81 mg Oral Daily   . atorvastatin  20 mg Oral QPM   . guaiFENesin  1,200 mg Oral 2 times per day   . heparin  5,000 Units Subcutaneous Q8H SCH   . insulin aspart   Subcutaneous 4x Daily - AC and HS   . insulin aspart   Subcutaneous TID with meals   . insulin glargine  40 Units Subcutaneous Nightly   . metoprolol tartrate  150 mg Oral 2 times per day   . polyethylene glycol  17 g Oral Daily   . [START ON 07/21/2016] predniSONE  40 mg Oral Daily   . senna-docusate  2 tablet Oral 2 times per day   . sodium chloride 0.9 % (NS) syringe  5-10 mL Intravenous [facility]         Physical Exam:  Alert and oriented  Lungs clear bilaterally  Regular rate and rhythm  Abdomen nontender, nondistended  Musculoskeletal: 2+ edema bilaterally  Skin: Erythema on the left lower leg     Recent Labs:  Results for orders placed or performed during the hospital encounter of 07/11/16 (from the past 24 hour(s))   POCT Glucose -Next Routine    Collection Time: 07/19/16  4:20 PM   Result Value Ref Range    POC Glucose 210 (H) 80 - 99 mg/dL   POCT Glucose -Next Routine    Collection Time: 07/19/16  8:54 PM   Result Value Ref Range    POC Glucose 174 (H) 80 - 99 mg/dL   POCT Glucose -Next Routine    Collection Time: 07/20/16  5:43 AM   Result Value Ref Range    POC Glucose 155 (H) 80 - 99 mg/dL   Renal Function Panel -Daily    Collection Time: 07/20/16  5:53 AM   Result Value Ref Range    Albumin 3.0 (L) 3.5 - 5.0 g/dL    Calcium 8.6 8.6 - 45.4 mg/dL    Phosphorus 6.6 (H) 2.5 - 4.5 mg/dL    BUN 098 (H) 6 - 23 mg/dL    Creatinine 1.19 (H) 0.65 - 1.30 mg/dL    Sodium 147 829 - 562 mmol/L    Glucose 150 (H) 80 - 99 mg/dL    Potassium 4.8 3.5 - 5.1 mmol/L    Chloride 107 98 - 111 mmol/L    CO2 - Carbon Dioxide 17.2 (L) 21.0 - 31.0 mmol/L    Anion Gap 13.8 (H) 3.0 - 11.0 mmol/L    GFR Estimate 12 (L) >=60 mL/min/1.76m*2    GFR Additional Info     POCT Glucose -Next Routine    Collection  Time: 07/20/16 11:41 AM   Result Value Ref Range    POC Glucose 210 (H) 80 - 99 mg/dL       Recent Imaging:  No results found.    Assessment and plan:     71 year old gentleman with history of hypertension, COPD, diabetes, who presented with erythema and tenderness of his left lower extremity.  ?  Acute kidney injury:  Most likely represents contrast nephropathy secondary to contrast exposure.  Baseline creatinine is around 1.5, was 1.8 on admission  Peaked at 5.1  Today improved to 4.8  Holding lasix for now.  ?  Hypervolemia:  Improved with diuresis.  Good urine outptu  Will hold diuretics overnight and reassess  ?  Hyperkalemia:  Improved to 4.8    Plan of care was discussed with patient.      Suhaan Perleberg Abou-Ismail  [facility]  07/20/2016

## 2016-07-20 NOTE — Progress Notes (Signed)
End of Shift Report     Significant Events Significant rash spreading all over body. Switched to unasyn from levaquin and started on prednisone. Given benadryl for the rash and pt has cortisone cream available.      Relevant   Assessment Findings       Patient/Family  Questions/Concerns Wants diabetic educator to see him before DC. Consult order placed.      DC Barriers Continued iv abx   General Medicine Patients       Is the patient voiding or having normal catheter output? yes      Is the patient eating/drinking adequately? yes      Last bowel movement?   Yesterday       What is the stability of patient?  MEWS and VSS      How is pain managed and controlled? (last dose) No pain, just itching       Calls made to patient care provider   Re rash       New orders related to changes in patient condition?         Miscellaneous information from the shift    Dialysis Patients        When was last dialysis and were there any issues?       When is next dialysis planned any special instructions?

## 2016-07-20 NOTE — Treatment Summary (Signed)
IP PT TREATMENT NOTE     Patient:  Evan [name] / 6477/6477-01 DOB:  [birth year] / 71 y.o. Date:  07/20/2016     Precautions  Specific mobility precautions: None     ASSESSMENT     Summary:  Pt getting ready to go BTB,   Gait with FWW SBA ~ 55'  His gait continues slow and labored, with 1 standing rest, but he is gaining endurance  BTB with mod A for B LEs     Able to be up with Nursing Team (x 1).     Discharge recommendations  Mobility Equipment needs: Front wheeled walker (FWW) (bariatric)   Primary recommendation : Patient would benefit from SNF/Nursing Home/Other Skilled Facility.  Recommended transport method: Wheelchair  Secondary recommendation: Patient would benefit from Home Health PT.  Recommended transport method: Wheelchair  Justification: Progressive gait, balance, strengthening and mobility activities for safety and independence with retun to home      PLAN     Treatment Plan Frequency    Plan: Continue per established POC.  Pt. demonstrates limitations which require skilled PT intervention.   .  Frequency will be increased as pt's condition or discharge plan indicates.     Julius Hilton Sinclair will remain on the Physical Therapy schedule until goals are met, or there has been a change in the Plan of Care.          Treatment Diagnosis Surgery / # Days Post-op (if applicable)    Primary Dx: Sepsis, cellulitis  Treatment Dx: Impaired mobility   /         SUBJECTIVE   Pt up in chair, agrees to PT     Pain Level  Current status: Pain does not limit pt's ability to participate in therapy  Pain at start of session: 0 - No Pain  Pain at end of session: 0 - No Pain  Pain at rest: 0 - No Pain     OBJECTIVE   Strengthening, safe mobility, endurance     Mental status  General Demeanor: Pleasant;Cooperative  LOC: Alert  Orientation: Oriented x 4  --> Follows multi-step commands: Consistently  --> Follows one-step commands: Consistently  Attention span: Appears intact  Memory: Appears intact in social/therapy  situations     TREATMENT  Bed Mobility/Transfers  Bed mobility       Rolling / logroll        Transition to sitting up     To resting position      Transition to sitting down SBA     Transition to supine       Scooting     Transfers      Transition to standing From chair: SBA     Method Stand pivot: SBA      Gait/Stairs  Mobility - Gait (Level)  Gait (distance): 55 feet  Gait (assist level): CGA;SBA  Assistive device: Front wheeled walker (FWW)  Endurance: fair  Pace: slow, labored  Pattern: Wide BOS;Increased upper body tension  Surface: Level tile    Ther. Ex.  Therapeutic Exercises  Ankle pumps: 10  Heelslides: 5  Quad sets: 10  Gluteal sets: 10   Training/Education      Safety/Room Set-up   Call button accessible?: Yes  Phone accessible?: Yes  Oxygen reconnected?: No, on room air  Patient mobility in room: Able to be up with Nursing Team (x 1).  Position on arrival: In regular chair  Position on departure (bed): Bed in lowest position  Comments: CNA aware     GOALS     Patient/Caregiver goals reviewed and integrated with rehab treatment plan:      Multidisciplinary Problems (Active)        Problem: IP General Goal List - 2    Goal Priority Disciplines Outcome   Bed Mobility     PT    Description:  Pt. to perform bed mobility with min assist.    Transfers     PT    Description:  Pt. to perform all functional transfers with min. assist.    Ambulation - Levels     PT    Description:  Pt. to ambulate with min assist x 50 feet with appropriate assistive device.    Ambulation - Stairs     PT    Description:  Pt. to ascend/descend stairs with min assist, as per discharge environment.    Home Exercise Program     PT    Description:  Pt. to perform basic HEP with min assist.    Caregiver Training     PT    Description:  Patient/caregiver training will be provided, as needed to achieve goals.             Problem: Patient/Family Goal    Goal Priority Disciplines Outcome   Normal Status     PT    Description:  Pt. wants to  return to previous level of function.     Home     PT    Description:  Pt. wants to return to previous living situation.    Mobility     PT    Description:  Patient wants to walk again.                      G-Codes/PSFS/FIM Scores: (if applicable)          First session   Start/Stop times: 0830 - 0900   Total time: 30 minutes      This note to serve as a Discharge Summary if Divon Krabill is discharged from the hospital or from therapy services.     Therapist:  Heloise Beecham, PTA

## 2016-07-20 NOTE — Progress Notes (Signed)
Daily Progress Note   Name: Evan [name]  DOB: Dec 14, 1945 71 y.o.  MRN: [id number]  CSN: [id number]  PCP: Remi Deter [name]    Subjective:   Chief Complaint: sepsis left leg cellulitis    Patient is a very pleasant 71 year old male with a history of morbid obesity, hypertension, history of sleep apnea on C Pap, chronic kidney disease stage III, diabetes type 2 on insulin who presented with increasing weakness fevers and a syncopal episode. EMS was called and he was hypoxic and found to be septic with a lactic acid of 4. He had acute kidney injury with a creatinine up to 1.8 and an elevated troponin at 0.11. It is subsequently peaked at 7.7. Despite this he has never complained of chest pain. He has complained of feeling weak and having a cough and shortness of breath. He had a stress test which showed moderate probability of underlying coronary artery disease. They recommended medical management unless he had ongoing pain. His blood sugars have been elevated above 200 and we increased his lantus but they still remain elevated. He had an echocardiogram which showed right heart strain but does have a history of obstructive sleep apnea and obesity. He did have an ultrasound of his lower extremities which showed no evidence of DVT. A CT pulmonary angiogram showed no evidence of pulmonary emboli. Unfortunately it does look like he developed a contrast-induced nephropathy. We have had fluids on and off and it appears his creatinine Has plateaued at 5.1, currently 4.83. I did ask nephrology to see him and they did not feel fluids would help him anymore but recommended 20 mg of Lasix, Which we have given him for the last couple of days. He put out almost 6 L of fluid so far. January 25 the patient's blood culture turned positive with gram-positive cocci he was started on vancomycin but it looks like it was a staph epidermidis so it was stopped.    Patient's last fever was January 26 at 1954. January 27 his leg was more  swollen and his rash looked worse. It started leaking. Thinking he probably had a polymicrobial infection with his underlying diabetes we switched him from Rocephin to Levaquin. His leukocytosis has resolved and the wound looks much better. The wound culture final was no growth.    Patient has developed a rash which is pruritic and widespread the majority on his back but also on his chest and his face. He is denying any chest pain, shortness of breath, nausea or vomiting    Objective:   Vital Signs:  BP: 136/70  Pulse: 77  Resp: 22  Temp: 36.5 ?C (97.7 ?F)  SpO2: 96 % (02/02 1107)  Intake/Ouput:    Intake/Output Summary (Last 24 hours) at 07/20/16 1357  Last data filed at 07/20/16 1107   Gross per 24 hour   Intake             1960 ml   Output             2450 ml   Net             -490 ml      Exam:  General: Patient is in no acute distress.  Pulmonary: Clear to auscultation bilaterally without wheezes rales or rhonchi.  Cardiovascular: Regular rate and rhythm without murmurs rubs or gallops.   GI: Normal active bowel sounds, soft, non-distended, nontender.  Musculoskeletal: No clubbing cyanosis Does have 2+ bilateral lower extremity edema greater on the left than  the right.  Dermatologic: Leg rash is looking better however he has a new erythematous papular rash across his back mostly but also on his arms chest and face. The appearance on the back is that of a heat rash but on the face might be more of a drug rash.        Recent Labs:  Results for orders placed or performed during the hospital encounter of 07/11/16 (from the past 24 hour(s))   POCT Glucose -Next Routine    Collection Time: 07/19/16  4:20 PM   Result Value Ref Range    POC Glucose 210 (H) 80 - 99 mg/dL   POCT Glucose -Next Routine    Collection Time: 07/19/16  8:54 PM   Result Value Ref Range    POC Glucose 174 (H) 80 - 99 mg/dL   POCT Glucose -Next Routine    Collection Time: 07/20/16  5:43 AM   Result Value Ref Range    POC Glucose 155 (H) 80 - 99  mg/dL   Renal Function Panel -Daily    Collection Time: 07/20/16  5:53 AM   Result Value Ref Range    Albumin 3.0 (L) 3.5 - 5.0 g/dL    Calcium 8.6 8.6 - 52.8 mg/dL    Phosphorus 6.6 (H) 2.5 - 4.5 mg/dL    BUN 413 (H) 6 - 23 mg/dL    Creatinine 2.44 (H) 0.65 - 1.30 mg/dL    Sodium 010 272 - 536 mmol/L    Glucose 150 (H) 80 - 99 mg/dL    Potassium 4.8 3.5 - 5.1 mmol/L    Chloride 107 98 - 111 mmol/L    CO2 - Carbon Dioxide 17.2 (L) 21.0 - 31.0 mmol/L    Anion Gap 13.8 (H) 3.0 - 11.0 mmol/L    GFR Estimate 12 (L) >=60 mL/min/1.52m*2    GFR Additional Info     POCT Glucose -Next Routine    Collection Time: 07/20/16 11:41 AM   Result Value Ref Range    POC Glucose 210 (H) 80 - 99 mg/dL     Pending Labs     Order Current Status    Blood Culture -x 2 Preliminary result    Blood Culture -x 2 Preliminary result        Recent Imaging:  No results found.    Assessment & Plan:   1: Severe sepsis on presentation. Seems out of proportion to what I would expect from his cellulitis but has resolved.  2. Syncope. Unclear etiology. He did have a non-ST elevation MI and this may have caused it. Doppler unremarkable, no ectopy on telemetry. His echocardiogram showed right heart strain.   3. Non-ST elevation MI. His troponin has peaked at 7.7. I did talk to the cardiologist on-call who read his stress test and refer the whole story and he said unless the patient is having chest pain he would medically manage him. Patient is already on an aspirin, beta blocker, which I increased, possibly could go out more but with the diuresis he is having one to drop his blood pressure especially in the setting of acute kidney injury so wanted this time. We continued his statin but no long-acting nitrate in the absence of chest pain. He had been on Lovenox which I stopped after 3 days. Fasting lipid profile looked okay. His hydrochlorothiazide and ACE inhibitor are on hold due to his renal failure. Patient has never had any chest pain.  4. Lower  extremity cellulitis. Likely source was  an ingrown toenail. He is a diabetic and I think he is doing better on Levaquin, However I am concerned this may be the source of his rash. I will switch to Unasyn. We are also elevating his leg. Wound care did get cultures which are no growth final.  5. Acute kidney injury on chronic kidney disease stage III, secondary to contrast nephropathy. Creatinine is slowly coming down now at 4.83. I will hold off on further diuresis.  6. Hyperkalemia. Resolved with furosemide.  7. Diabetes type 2. Patient was started on 10 units of Lantus 2 days before he came to the hospital. I have increased it to 40. His metformin and Amaryl are on hold. Yesterday increased his carb coverage originally at one unit for every 15 g to one unit for every 5 g and his blood sugars seem better today most of been under 200.  7. Ingrown toenail. This could be the source of his cellulitis.  8. Edema. Initially he was up 5 L but with several doses of Lasix is now in a net diuresis. Hold off in any further furosemide until he gets better kidney improvement.  9. Maculopapular rash. His back looks like a heat rash but given the multiple different areas; especially the face think this could be a drug reaction. I will stop his Levaquin and start him on prednisone.    Dr. Olga Coaster will see the patient tomorrow.    LOS 8 days  Total time spent 22 minutes.  Electronically signed by: Iantha Fallen Sanford2/2/20181:57 PM  This dictation was produced using voice recognition software. Despite concurrent proofreading, please note that homonyms and other transcription errors may be present and that these errors may not truly reflect my intent.

## 2016-07-20 NOTE — Progress Notes (Signed)
PHARMACIST PROGRESS NOTE     The following pharmacy services were provided: anti-infective interventions and renal dosing    SUBJECTIVE / OBJECTIVE     Evan [name] is a 71 y.o. Male admitted on 07/11/2016.    [name] is admitted with sepsis/cellulitis. PMH significant for obesity, HTN, CKD stage III and diabetes type II.     I have reviewed available relevant labs, vitals and flowsheets today.  The following are especially relevant:  SCr: 4.83   Urinary output last 24 hrs was 0.7 ml/Kg/hr      ASSESSMENT       Anti-infective Interventions  Indication(s):  Cellulitis possibly related to toenail infection; diabetic foot infection  Relevant microbiology:  1/24 blood 1 of 4: GPC resembling staph; Staph Aureus and MRSA PCR - neg; Staph epidermidis  Current anti-infective(s):  Unasyn 3g IV Q6h (2/2 - )  Relevant previous anti-infective(s):  Vancomycin IV (1/25 - 1/26 ); Ceftriaxone 1g IV daily (1/25 - 1/27 ); Levofloxacin 750mg  Q48h (1/27 - 2/2)  Days of therapy appropriate for indication:  9 today  Duration per literature:  Diabetic Foot Infection: Antibiotic therapy should be administered in conjunction with attentive wound care until signs of infection appear to have resolved (two to four weeks of therapy is usually sufficient.)  Reference(s):  UpToDate / LexiComp    PLAN       Anti-infective Interventions  2/2 - Patient with rash possibly related to levofloxacin - added as allergy to EPIC documentation        - MD changed empiric therapy to Unasyn 3g IV Q6h      Renal Dosing  2/2 - will adjust Unasyn to 3g IV q12h for CrCl < 30 ml/min         Signed by: Drucie Ip, [facility]

## 2016-07-20 NOTE — Discharge Planning (AHS/AVS) (Signed)
DCP Update: Met w/pt and his spouse in room. After discussing pt's DC needs, he and his spouse agree he would benefit from a SNF for strength training and additional medication management, prior to returning home. ALOC: [facility], [facility] or [facility]. DC likely Mon / Tues next week. Pt will benefit from [facility] after DC from SNF for additional education for insulin use & process. Pt will benefit from Diabetic Educator consult in hospital prior to DC.

## 2016-07-20 NOTE — Progress Notes (Signed)
End of Shift Report     Significant Events Dressing dry and intact. Patient afebrile VSS no reports of pain.     Relevant   Assessment Findings Patient continues to ask that foot of bari bed be deflated rather than seat causing malfunction. Ambulates to bathroom with standby assist.      Patient/Family  Questions/Concerns      DC Barriers Clinical imporvement.

## 2016-07-21 LAB — RENAL FUNCTION PANEL
Albumin: 3 g/dL — ABNORMAL LOW (ref 3.5–5.0)
Anion Gap: 14.1 mmol/L — ABNORMAL HIGH (ref 3.0–11.0)
BUN: 112 mg/dL — ABNORMAL HIGH (ref 6–23)
CO2 - Carbon Dioxide: 17.9 mmol/L — ABNORMAL LOW (ref 21.0–31.0)
Calcium: 8.9 mg/dL (ref 8.6–10.3)
Chloride: 106 mmol/L (ref 98–111)
Creatinine: 4.66 mg/dL — ABNORMAL HIGH (ref 0.65–1.30)
GFR Estimate: 12 mL/min/{1.73_m2} — ABNORMAL LOW (ref >=60)
Glucose: 177 mg/dL — ABNORMAL HIGH (ref 80–99)
Phosphorus: 6.2 mg/dL — ABNORMAL HIGH (ref 2.5–4.5)
Potassium: 4.8 mmol/L (ref 3.5–5.1)
Sodium: 138 mmol/L (ref 135–143)

## 2016-07-21 LAB — POCT GLUCOSE
POC Glucose: 147 mg/dL — ABNORMAL HIGH (ref 80–99)
POC Glucose: 212 mg/dL — ABNORMAL HIGH (ref 80–99)
POC Glucose: 165 mg/dL — ABNORMAL HIGH (ref 80–99)
POC Glucose: 247 mg/dL — ABNORMAL HIGH (ref 80–99)

## 2016-07-21 LAB — SKIN/SOFT TISSUE DNA WITH WOUND CULTURE
Skin/Soft Tissue MRSA: NOT DETECTED
Skin/Soft Tissue Staph Aureus: NOT DETECTED

## 2016-07-21 LAB — WOUND CULTURE, SUPERFICIAL
Culture Result: NO GROWTH
Gram Stain: NONE SEEN
Gram Stain: NONE SEEN

## 2016-07-21 MED ORDER — diphenhydrAMINE (BENADRYL) injection 25 mg
50 | Freq: Four times a day (QID) | INTRAMUSCULAR | Status: DC | PRN
Start: 2016-07-21 — End: 2016-08-02

## 2016-07-21 MED ORDER — furosemide (LASIX) injection 60 mg
10 | Freq: Once | INTRAMUSCULAR | Status: AC
Start: 2016-07-21 — End: 2016-07-21
  Administered 2016-07-21: 20:00:00 10 mg via INTRAVENOUS

## 2016-07-21 MED ORDER — hydrocortisone 1 % cream
1 | Freq: Two times a day (BID) | TOPICAL | Status: DC
Start: 2016-07-21 — End: 2016-08-02
  Administered 2016-07-21 – 2016-08-02 (×24): 1 % via TOPICAL

## 2016-07-21 MED ORDER — vancomycin (VANCOCIN) 2,000 mg in sodium chloride 0.9 % (NS) 500 mL IVPB
1000 | Freq: Once | INTRAVENOUS | Status: AC
Start: 2016-07-21 — End: 2016-07-21
  Administered 2016-07-21 (×2): via INTRAVENOUS

## 2016-07-21 MED FILL — FUROSEMIDE 10 MG/ML INJECTION SOLUTION: 10 mg/mL | INTRAMUSCULAR | Qty: 6

## 2016-07-21 MED FILL — HEPARIN, PORCINE (PF) 5,000 UNIT/0.5 ML SYRINGE: 5000 unit/0.5 mL | INTRAMUSCULAR | Qty: 0.5

## 2016-07-21 MED FILL — VANCOMYCIN 5 GRAM INTRAVENOUS SOLUTION: 5 g | INTRAVENOUS | Qty: 2000

## 2016-07-21 MED FILL — AMPICILLIN-SULBACTAM 3 GRAM SOLUTION FOR INJECTION: 3 g | INTRAMUSCULAR | Qty: 3

## 2016-07-21 MED FILL — DIPHENHYDRAMINE 25 MG CAPSULE: 25 mg | ORAL | Qty: 1

## 2016-07-21 MED FILL — HYDROCORTISONE 1 % TOPICAL CREAM: 1 % | TOPICAL | Qty: 28

## 2016-07-21 MED FILL — METOPROLOL TARTRATE 50 MG TABLET: 50 mg | ORAL | Qty: 3

## 2016-07-21 MED FILL — ATORVASTATIN 20 MG TABLET: 20 mg | ORAL | Qty: 1

## 2016-07-21 MED FILL — LANTUS U-100 INSULIN 100 UNIT/ML SUBCUTANEOUS SOLUTION: 100 [IU]/mL | SUBCUTANEOUS | Qty: 0.4

## 2016-07-21 MED FILL — ASPIRIN 81 MG TABLET,ENTERIC COATED: 81 mg | ORAL | Qty: 1

## 2016-07-21 MED FILL — MUCINEX 600 MG TABLET, EXTENDED RELEASE: 600 mg | ORAL | Qty: 2

## 2016-07-21 NOTE — Progress Notes (Signed)
Inpatient Nephrology Progress Note     Name: Evan [name]  DOB: 05-Dec-1945 71 y.o.  MRN: [id number]  CSN: [id number]  PCP: Marissa Nestle    Subjective:   71 year old gentleman with history of hypertension, COPD, diabetes, who presented with erythema and tenderness of his left lower extremity. He was also having shortness of breath and had a syncopal episode prior to admission. He was seen by EMS and found to be hypoxic. His initial labs showed Creatinine of 1.8 which is higher than baseline and lactic acidosis. He received contrast on January 26 and his creatinine increased to 3.7 the next day and continued to increase.  He also had positive blood cultures on January 25. Troponin of 0.1 on admission which peaked at 7.7 before trending down. He had no chest pain during that time. Ultrasound of his legs showed no DVT. CTA showed no pulmonary embolus.    Today states that he feels okay, has been able to ambulate to the bathroom, denies any shortness of breath associated with that. Not much appetite. No other complaints.      Objective:   Vital Signs:    Temp: 36.7 ?C (98 ?F) BP: 130/72 Pulse: 65 Resp: 20 SpO2: 97 % on O2 Flow Rate (L/min): 2 L/min None (Room air)   Min/Max Temp past 24 hours:Temp  Avg: 36.8 ?C (98.2 ?F)  Min: 36.4 ?C (97.5 ?F)  Max: 37.2 ?C (98.9 ?F)  Weight most recent: Weight: (!) 180.3 kg (397 lb 7.8 oz)     INTAKE/OUTPUT    Intake/Output Summary (Last 24 hours) at 07/21/16 1032  Last data filed at 07/21/16 0416   Gross per 24 hour   Intake             1200 ml   Output             2250 ml   Net            -1050 ml     SUGARS:   POC Glucose   Date/Time Value Ref Range Status   07/21/2016 05:04 AM 165 (H) 80 - 99 mg/dL Final   81/19/1478 29:56 PM 212 (H) 80 - 99 mg/dL Final   21/30/8657 84:69 PM 147 (H) 80 - 99 mg/dL Final   62/95/2841 32:44 AM 210 (H) 80 - 99 mg/dL Final   06/20/7251 66:44 AM 155 (H) 80 - 99 mg/dL Final       . aspirin  81 mg Oral Daily   . atorvastatin  20 mg Oral QPM   .  heparin  5,000 Units Subcutaneous Q8H SCH   . hydrocortisone   Topical 2 times per day   . insulin aspart   Subcutaneous 4x Daily - AC and HS   . insulin aspart   Subcutaneous TID with meals   . insulin glargine  40 Units Subcutaneous Nightly   . metoprolol tartrate  150 mg Oral 2 times per day   . polyethylene glycol  17 g Oral Daily   . senna-docusate  2 tablet Oral 2 times per day   . sodium chloride 0.9 % (NS) syringe  5-10 mL Intravenous Q8H SCH   . vancomycin (VANCOCIN) IV  2,000 mg Intravenous Once         Physical Exam:  Alert and oriented  Lungs clear bilaterally  Regular rate and rhythm  Abdomen nontender, nondistended  Musculoskeletal: 2+ edema bilaterally  Skin: Erythema on the left lower leg, trunk, and head  Recent Labs:  Results for orders placed or performed during the hospital encounter of 07/11/16 (from the past 24 hour(s))   POCT Glucose -Next Routine    Collection Time: 07/20/16 11:41 AM   Result Value Ref Range    POC Glucose 210 (H) 80 - 99 mg/dL   POCT Glucose -Next Routine    Collection Time: 07/20/16  4:56 PM   Result Value Ref Range    POC Glucose 147 (H) 80 - 99 mg/dL   POCT Glucose -Next Routine    Collection Time: 07/20/16  8:09 PM   Result Value Ref Range    POC Glucose 212 (H) 80 - 99 mg/dL   POCT Glucose -Next Routine    Collection Time: 07/21/16  5:04 AM   Result Value Ref Range    POC Glucose 165 (H) 80 - 99 mg/dL   Renal Function Panel -Daily    Collection Time: 07/21/16  5:06 AM   Result Value Ref Range    Albumin 3.0 (L) 3.5 - 5.0 g/dL    Calcium 8.9 8.6 - 63.0 mg/dL    Phosphorus 6.2 (H) 2.5 - 4.5 mg/dL    BUN 160 (H) 6 - 23 mg/dL    Creatinine 1.09 (H) 0.65 - 1.30 mg/dL    Sodium 323 557 - 322 mmol/L    Glucose 177 (H) 80 - 99 mg/dL    Potassium 4.8 3.5 - 5.1 mmol/L    Chloride 106 98 - 111 mmol/L    CO2 - Carbon Dioxide 17.9 (L) 21.0 - 31.0 mmol/L    Anion Gap 14.1 (H) 3.0 - 11.0 mmol/L    GFR Estimate 12 (L) >=60 mL/min/1.29m*2    GFR Additional Info         Recent  Imaging:  No results found.    Assessment and plan:     71 year old gentleman with history of hypertension, COPD, diabetes, who presented with erythema and tenderness of his left lower extremity.  ?  Acute kidney injury:  Most likely represents contrast nephropathy secondary to contrast exposure1/26.  Baseline creatinine is around 1.5, was 1.8 on admission, peaked at 5.1.  Today improved to 4.7, UOP remains excellent but he has quite a bit of ongoing edema and so I will give a single dose of Lasix to help him along.  Agree with checking hemoglobin tomorrow, at risk for renal anemia  ?  ===Per hospitalists:   -diffuse rash, possible drug reaction  -Diastolic congestive heart failure, echo indicating RV overload. Moderate probability noninvasive stress test  -DM2      Plan of care was discussed with patient.      Starr Lake Mally Gavina  [facility]  07/21/2016

## 2016-07-21 NOTE — Progress Notes (Addendum)
PHARMACIST PROGRESS NOTE     The following pharmacy services were provided: anti-infective interventions, renal dosing and vancomycin dosing    SUBJECTIVE / OBJECTIVE     Evan [name] is a 71 y.o. Male admitted on 07/11/2016.    I have reviewed available relevant labs, vitals and flowsheets today.  The following are especially relevant:  SCr 4.66 with eCrCl ~ 24 mL/min    ASSESSMENT       Anti-infective Interventions  Indication(s):  LLE cellulitis with hx of CKD and DM II  Relevant microbiology:  01/28 leg wound superficial:  NG - final  01/24 blood x 4 - anaerobic bottle with Staph epidermidis - contaminate - final  01/24 blood PCR - negative for MRSA    Current anti-infective(s):  Vancomycin IV PTD (02/03-)  Relevant previous anti-infective(s):  Unasyn 3 G IV Q6H (2/2-2/3); Levofloxacin 750 mg IV Q48H (1/27-2/2); Vancomycin PTD (1/25-1/26); ceftriaxone 1 G IV Daily (1/25-1/27); levofloxacin 750 mg Q48H; cefepime 2 G IV x 1 (1/24)  Days of therapy appropriate for indication:  5-14 days depending on clinical improvement  Reference(s):  UpToDate / LexiComp    Kinetic Dosing  Renal function appears to be improving.    Renal Dosing  The following medications have been renally adjusted and do not require change:      PLAN       Anti-infective Interventions  Pharmacy to monitor for culture data/placement of stop date.  Provider ordered ESR and CRP for tomorrow AM.    Kinetics  Next vancomycin level scheduled for:  Random level 02/04 with AM labs  Hesitant to do a full 15 mg/kg, per protocol, due to obesity and declining renal function.  Pulse dose patient at 2000 mg (~ 11 mg/kg).     Renal Dosing  BMP already on order by provider.       Signed by: Luther Redo, RPH, PharmD

## 2016-07-21 NOTE — Progress Notes (Signed)
End of Shift Report     Significant Events Switched to vanco from unasyn.      Relevant   Assessment Findings Wounds still weeping, swabbed for MRSA and dressing change done today.   Rash all over body is still present but seems to be itching less and less irritating from previous shift.       Patient/Family  Questions/Concerns      DC Barriers Placement and continued abx for healing of cellulitis in legs   General Medicine Patients       Is the patient voiding or having normal catheter output? yes      Is the patient eating/drinking adequately? yes      Last bowel movement?   07/18/16      What is the stability of patient?  MEWS and VSS      How is pain managed and controlled? (last dose) No pain, benadryl for itching and hydrocortisone cream       Calls made to patient care provider   none      New orders related to changes in patient condition?         Miscellaneous information from the shift    Dialysis Patients        When was last dialysis and were there any issues?       When is next dialysis planned any special instructions?

## 2016-07-21 NOTE — Progress Notes (Signed)
Daily Progress Note   Name: Evan [name]  DOB: 1945/07/27 71 y.o.  MRN: [id number]  CSN: [id number]  [name]    Subjective:   Chief Complaint:  cellulitis    Evan [name] is a 71 y.o. male patient. Past medical history of morbid obesity, hypertension, sleep apnea on CPAP, chronic kidney disease stage III, type 2 diabetes mellitus on insulin. Presented with increasing weakness, fever and a syncopal episode. EMS called and he was hypoxic and was found to be septic with lactic acid of 4. He had acute kidney injury on presentation with creatinine up to 1.8 and elevated troponin at 0.11 and subsequently peaked at 7.7. He did not have chest pain.[name] He complained of feeling weak with cough and shortness of breath. Stress test was moderate probability for underlying CAD. Cardiology recommended medical management unless he was having ongoing chest pain. Blood sugars were elevated and his Lantus was increased. Echocardiogram showed right heart strain but the patient has history of obstructive sleep apnea and obesity.. He had ultrasound of the lower extremities which did not show evidence of DVT. CT pulmonary angiogram showed no evidence of pulmonary emboli. Reportedly the patient developed contrast-induced nephropathy. His creatinine peaked at 5.1 nephrology consulted.     No chest pain or shortness of breath. No abdominal pain or nausea or vomiting. There rash is itchy. No fever. He is making urine. Has edema. Cellulitis of the left lower extremity slow to resolve  Objective:     Vitals Current 24 Hour Min / Max      Temp    36.7 ?C (98 ?F)    Temp  Min: 36.4 ?C (97.5 ?F)  Max: 37.2 ?C (98.9 ?F)      BP     130/72     BP  Min: 120/70  Max: 140/78      HR    65    Pulse  Min: 62  Max: 77      RR    20    Resp  Min: 20  Max: 22      Sats      SpO2: 97 % on  L/min       SpO2  Min: 91 %  Max: 98 %      Weight    (!) 180.3 kg (397 lb 7.8 oz)  Body mass index is 52.16 kg/m?[name]    Admit: (!) 181.4 kg (400 lb)      Intake/Ouput:    Intake/Output Summary (Last 24 hours) at 07/21/16 0851  Last data filed at 07/21/16 0416   Gross per 24 hour   Intake             1200 ml   Output             2250 ml   Net            -1050 ml      Drains output:       Exam:  Constitutional: No acute distress morbidly obese  Eyes:  PER  HENT:  upple   Respiratory:  No respiratory distress, normal breath sounds, no rales, no wheezing   Cardiovascular:  Normal rate, normal rhythm, no murmurs, no gallops, no rubs   GI:  Soft, nontender  Musculoskeletal:  Bilateral leg edema erythema and rubor of the left lower extremity consistent with cellulitis  Neurologic:  Alert & oriented x 3,  no focal deficits noted   Psychiatric:  Speech and behavior appropriate  Skin: Maculopapular urticarial rash resolving phase back upper extremities and lower extremities      Scheduled Medications:  . ampicillin-sulbactam (UNASYN) IV  3 g Intravenous Q12H   . aspirin  81 mg Oral Daily   . atorvastatin  20 mg Oral QPM   . guaiFENesin  1,200 mg Oral 2 times per day   . heparin  5,000 Units Subcutaneous Q8H SCH   . hydrocortisone   Topical 2 times per day   . insulin aspart   Subcutaneous 4x Daily - AC and HS   . insulin aspart   Subcutaneous TID with meals   . insulin glargine  40 Units Subcutaneous Nightly   . metoprolol tartrate  150 mg Oral 2 times per day   . polyethylene glycol  17 g Oral Daily   . predniSONE  40 mg Oral Daily   . senna-docusate  2 tablet Oral 2 times per day   . sodium chloride 0.9 % (NS) syringe  5-10 mL Intravenous Q8H SCH     Infusions:   PRN Medications: acetaminophen, benzocaine-menthol, bisacodyl, dextrose, dextrose, dextrose, diphenhydrAMINE, diphenhydrAMINE, glucagon (human recombinant), HYDROcodone-acetaminophen, magnesium hydroxide, melatonin, nicotine polacrilex, phenol, polyvinyl alcohol, regadenoson, sodium chloride 0.9 % (NS) syringe, urea  Recent Labs:  Results for orders placed or performed during the hospital encounter of 07/11/16  (from the past 24 hour(s))   POCT Glucose -Next Routine    Collection Time: 07/20/16 11:41 AM   Result Value Ref Range    POC Glucose 210 (H) 80 - 99 mg/dL   POCT Glucose -Next Routine    Collection Time: 07/20/16  4:56 PM   Result Value Ref Range    POC Glucose 147 (H) 80 - 99 mg/dL   POCT Glucose -Next Routine    Collection Time: 07/20/16  8:09 PM   Result Value Ref Range    POC Glucose 212 (H) 80 - 99 mg/dL   POCT Glucose -Next Routine    Collection Time: 07/21/16  5:04 AM   Result Value Ref Range    POC Glucose 165 (H) 80 - 99 mg/dL   Renal Function Panel -Daily    Collection Time: 07/21/16  5:06 AM   Result Value Ref Range    Albumin 3.0 (L) 3.5 - 5.0 g/dL    Calcium 8.9 8.6 - 16.1 mg/dL    Phosphorus 6.2 (H) 2.5 - 4.5 mg/dL    BUN 096 (H) 6 - 23 mg/dL    Creatinine 0.45 (H) 0.65 - 1.30 mg/dL    Sodium 409 811 - 914 mmol/L    Glucose 177 (H) 80 - 99 mg/dL    Potassium 4.8 3.5 - 5.1 mmol/L    Chloride 106 98 - 111 mmol/L    CO2 - Carbon Dioxide 17.9 (L) 21.0 - 31.0 mmol/L    Anion Gap 14.1 (H) 3.0 - 11.0 mmol/L    GFR Estimate 12 (L) >=60 mL/min/1.53m*2    GFR Additional Info         Recent Imaging:  No results found.      ASSESMENT:     - Severe sepsis on presentation. resolved    - Left lower extremity cellulitis  The patient was on Levaquin however this was switched to Unasyn due to concern with possible drug rash    - Syncope. No clear etiology. Could've been due to sepsis and hypotension. CT chest did not show evidence of PE. Echocardiogram showed evidence of right heart strain but the patient has underlying history of  morbid obesity and obstructive sleep apnea.     - Non-STEMI. Troponin peaked at 7.7. Stress test showed moderate likelihood for flow-limiting CAD . Dr. Marisue Humble discussed with cardiology and they recommended medical management unless the patient was having ongoing chest pain.     - Acute kidney injury superimposed on chronic kidney disease stage III. Baseline creatinine 1.3 . Suspected  secondary to contrast nephropathy. Nephrology on board. Creatinine peaked at 5.10 and improving. Today is 4.66    - Hyperkalemia    - Type 2 diabetes mellitus on insulin with hyperglycemia. Metformin and Amaryl on hold    - Edema. Most likely volume overload with acute kidney injury    - Maculopapular rash. Most likely drug rash suspected due to Levaquin        PLAN:   - Cellulitis is not improving as expected. We will consult pharmacy for vancomycin dosing with renal function. Discontinue Unasyn. Follow closely. Obtain ESR and CRP.    - continue ASA , statin and beta blocker for medical management of CAD    - Management of renal function and diuresis per nephrology    - Regarding the rash most likely drug rash. I will hold prednisone to avoid further hyperglycemia and treat with Benadryl symptomatically    - Continue current insulin regimen    - Continue home CPAP    - Wound care    - Physical therapy    DVT prophylaxis: Heparin subcutaneous   LOS: 9 days  Days  Total time spent: 30 minutes.  Electronically signed:Keerthi Hazell Abdulhadi2/3/20188:51 AM  This dictation was produced using voice recognition software. Despite concurrent proofreading, please note transcription errors may be present and that these errors may not truly reflect my intent.

## 2016-07-22 LAB — ABG
Allen's Test: POSITIVE
Base Excess: -7.8 mmol/L — ABNORMAL LOW (ref ?–2.0)
Carboxyhemoglobin: 0.5 % (ref 0.0–1.5)
HCO3 Arterial: 16.7 mmol/L — ABNORMAL LOW (ref 22.0–26.0)
Hemoglobin: 11.4 g/dL — ABNORMAL LOW (ref 12.0–18.0)
Methemoglobin: 0.3 % (ref 0.0–1.0)
O2Hb: 93 % — ABNORMAL LOW (ref 94.0–97.0)
Puncture Attempts: 2
Time Analyzed: 20180204093733
pCO2 Arterial: 31.1 mmHg — ABNORMAL LOW (ref 35.0–45.0)
pH Arterial: 7.349 — ABNORMAL LOW (ref 7.350–7.450)
pO2 Arterial: 73.6 mmHg — ABNORMAL LOW (ref 80.0–100.0)
sO2 Arterial: 93.8 % (ref 92.0–98.5)

## 2016-07-22 LAB — BASIC METABOLIC PANEL
Anion Gap: 13.7 mmol/L — ABNORMAL HIGH (ref 3.0–11.0)
BUN: 114 mg/dL — ABNORMAL HIGH (ref 6–23)
CO2 - Carbon Dioxide: 14.3 mmol/L — ABNORMAL LOW (ref 21.0–31.0)
Calcium: 8.8 mg/dL (ref 8.6–10.3)
Chloride: 110 mmol/L (ref 98–111)
Creatinine: 4.33 mg/dL — ABNORMAL HIGH (ref 0.65–1.30)
GFR Estimate: 14 mL/min/{1.73_m2} — ABNORMAL LOW (ref >=60)
Glucose: 159 mg/dL — ABNORMAL HIGH (ref 80–99)
Potassium: 5 mmol/L (ref 3.5–5.1)
Sodium: 138 mmol/L (ref 135–143)

## 2016-07-22 LAB — RENAL FUNCTION PANEL
Albumin: 3 g/dL — ABNORMAL LOW (ref 3.5–5.0)
Anion Gap: 13.7 mmol/L — ABNORMAL HIGH (ref 3.0–11.0)
BUN: 114 mg/dL — ABNORMAL HIGH (ref 6–23)
CO2 - Carbon Dioxide: 14.3 mmol/L — ABNORMAL LOW (ref 21.0–31.0)
Calcium: 8.8 mg/dL (ref 8.6–10.3)
Chloride: 110 mmol/L (ref 98–111)
Creatinine: 4.33 mg/dL — ABNORMAL HIGH (ref 0.65–1.30)
GFR Estimate: 14 mL/min/{1.73_m2} — ABNORMAL LOW (ref >=60)
Glucose: 159 mg/dL — ABNORMAL HIGH (ref 80–99)
Phosphorus: 6.1 mg/dL — ABNORMAL HIGH (ref 2.5–4.5)
Potassium: 5 mmol/L (ref 3.5–5.1)
Sodium: 138 mmol/L (ref 135–143)

## 2016-07-22 LAB — CBC WITH AUTO DIFFERENTIAL
Bands %: 1 % (ref 0–10)
Bands, Absolute: 0.2 10*3/ÂµL (ref 0.0–1.2)
Basophils %: 0 % (ref 0–2)
Basophils, Absolute: 0 10*3/ÂµL (ref 0.0–0.2)
Eosinophils %: 7 % (ref 0–7)
Eosinophils, Absolute: 1.2 10*3/ÂµL — ABNORMAL HIGH (ref 0.0–0.7)
HCT: 32.7 % — ABNORMAL LOW (ref 42.0–54.0)
Hemoglobin: 10.7 g/dL — ABNORMAL LOW (ref 12.0–18.0)
Lymphocytes %: 7 % — ABNORMAL LOW (ref 25–45)
Lymphocytes, Absolute: 1.2 10*3/ÂµL (ref 1.1–4.3)
MCH: 33.2 pg (ref 27.0–34.0)
MCHC: 32.8 g/dL (ref 32.0–36.0)
MCV: 101.5 fL — ABNORMAL HIGH (ref 81.0–99.0)
MPV: 9.1 fL (ref 7.4–10.4)
Monocytes %: 1 % (ref 0–12)
Monocytes, Absolute: 0.2 10*3/ÂµL (ref 0.0–1.2)
Neutrophils %: 84 % — ABNORMAL HIGH (ref 35–70)
Neutrophils, Absolute: 14.8 10*3/ÂµL — ABNORMAL HIGH (ref 1.6–7.3)
Platelet Count: 323 10*3/??L (ref 150–400)
Platelet Estimate: NORMAL
RBC: 3.22 10*6/??L — ABNORMAL LOW (ref 4.70–6.10)
RDW: 14.2 % (ref 11.5–14.5)
WBC: 17.6 10*3/??L — ABNORMAL HIGH (ref 4.8–10.8)

## 2016-07-22 LAB — ERYTHROCYTE SEDIMENTATION RATE, AUTOMATED: Erythrocyte Sedimentation Rate, Automated: 44 mm/hr — ABNORMAL HIGH (ref 0–20)

## 2016-07-22 LAB — POCT GLUCOSE
POC Glucose: 133 mg/dL — ABNORMAL HIGH (ref 80–99)
POC Glucose: 218 mg/dL — ABNORMAL HIGH (ref 80–99)
POC Glucose: 161 mg/dL — ABNORMAL HIGH (ref 80–99)
POC Glucose: 173 mg/dL — ABNORMAL HIGH (ref 80–99)

## 2016-07-22 LAB — C-REACTIVE PROTEIN: CRP: 2.3 mg/dL — ABNORMAL HIGH (ref <1.00)

## 2016-07-22 LAB — VANCOMYCIN, RANDOM: Vancomycin, Timed: 15.2 ug/mL (ref 10.0–20.0)

## 2016-07-22 MED ORDER — furosemide (LASIX) injection 40 mg
10 | Freq: Once | INTRAMUSCULAR | Status: AC
Start: 2016-07-22 — End: 2016-07-22
  Administered 2016-07-22: 19:00:00 10 mg via INTRAVENOUS

## 2016-07-22 MED ORDER — vancomycin (VANCOCIN) 1,250 mg in sodium chloride 0.9 % (NS) 250 mL IVPB
1000 | Freq: Once | INTRAVENOUS | Status: AC
Start: 2016-07-22 — End: 2016-07-22
  Administered 2016-07-22 (×2): via INTRAVENOUS

## 2016-07-22 MED FILL — FUROSEMIDE 10 MG/ML INJECTION SOLUTION: 10 mg/mL | INTRAMUSCULAR | Qty: 4

## 2016-07-22 MED FILL — ATORVASTATIN 20 MG TABLET: 20 mg | ORAL | Qty: 1

## 2016-07-22 MED FILL — SENNA PLUS 8.6 MG-50 MG TABLET: 8.6-50 mg | ORAL | Qty: 1

## 2016-07-22 MED FILL — VANCOMYCIN 5 GRAM INTRAVENOUS SOLUTION: 5 g | INTRAVENOUS | Qty: 1250

## 2016-07-22 MED FILL — METOPROLOL TARTRATE 50 MG TABLET: 50 mg | ORAL | Qty: 3

## 2016-07-22 MED FILL — ASPIRIN 81 MG TABLET,ENTERIC COATED: 81 mg | ORAL | Qty: 1

## 2016-07-22 MED FILL — MILK OF MAGNESIA 400 MG/5 ML ORAL SUSPENSION: 400 | ORAL | Qty: 30

## 2016-07-22 MED FILL — SENNA PLUS 8.6 MG-50 MG TABLET: 8.6-50 mg | ORAL | Qty: 2

## 2016-07-22 MED FILL — HEPARIN, PORCINE (PF) 5,000 UNIT/0.5 ML SYRINGE: 5000 unit/0.5 mL | INTRAMUSCULAR | Qty: 0.5

## 2016-07-22 MED FILL — DIPHENHYDRAMINE 25 MG CAPSULE: 25 mg | ORAL | Qty: 1

## 2016-07-22 MED FILL — LANTUS U-100 INSULIN 100 UNIT/ML SUBCUTANEOUS SOLUTION: 100 [IU]/mL | SUBCUTANEOUS | Qty: 0.4

## 2016-07-22 MED FILL — BISAC-EVAC 10 MG RECTAL SUPPOSITORY: 10 mg | RECTAL | Qty: 1

## 2016-07-22 NOTE — Progress Notes (Signed)
End of Shift Report     Significant Events None.     Relevant   Assessment Findings Diminished lung sounds bilaterally.  +3 pitting edema lower extremities. Left leg leg dressing clean and intact. Rash and redness covering body, upper and lower extremities bilaterally, face back and trunk.       Patient/Family  Questions/Concerns None stated from pt.     DC Barriers    General Medicine Patients       Is the patient voiding or having normal catheter output? Yes      Is the patient eating/drinking adequately? Yes      Last bowel movement?   1/31      What is the stability of patient?  VSS      How is pain managed and controlled? (last dose) No pain meds wanted       Calls made to patient care provider   N/A      New orders related to changes in patient condition?  N/A       Miscellaneous information from the shift N/A

## 2016-07-22 NOTE — Progress Notes (Signed)
Inpatient Nephrology Progress Note     Name: Evan [name]  DOB: Aug 15, 1945 71 y.o.  MRN: [id number]  CSN: [id number]  PCP: Marissa Nestle    Subjective:   71 year old gentleman with history of hypertension, COPD, diabetes, who presented with erythema and tenderness of his left lower extremity. He was also having shortness of breath and had a syncopal episode prior to admission. He was seen by EMS and found to be hypoxic. His initial labs showed Creatinine of 1.8 which is higher than baseline and lactic acidosis. He received contrast on January 26 and his creatinine increased to 3.7 the next day and continued to increase.  He also had positive blood cultures on January 25. Troponin of 0.1 on admission which peaked at 7.7 before trending down. He had no chest pain during that time. Ultrasound of his legs showed no DVT. CTA showed no pulmonary embolus.    Today states that he feels okay other than constipation.  Has been OOB, ate all his breakfast, denies SOB.  No difficulty voiding, no other c/o.      Objective:   Vital Signs:    Temp: 36.6 ?C (97.8 ?F) BP: 122/70 Pulse: 62 Resp: 20 SpO2: 98 % on O2 Flow Rate (L/min): 2 L/min CPAP   Min/Max Temp past 24 hours:Temp  Avg: 36.7 ?C (98.1 ?F)  Min: 36.3 ?C (97.4 ?F)  Max: 37.2 ?C (98.9 ?F)  Weight most recent: Weight: (!) 180.3 kg (397 lb 7.8 oz)     INTAKE/OUTPUT    Intake/Output Summary (Last 24 hours) at 07/22/16 0833  Last data filed at 07/22/16 0405   Gross per 24 hour   Intake             1372 ml   Output             2220 ml   Net             -848 ml     SUGARS:   POC Glucose   Date/Time Value Ref Range Status   07/22/2016 05:44 AM 161 (H) 80 - 99 mg/dL Final   81/19/1478 29:56 PM 218 (H) 80 - 99 mg/dL Final   21/30/8657 84:69 PM 133 (H) 80 - 99 mg/dL Final   62/95/2841 32:44 AM 247 (H) 80 - 99 mg/dL Final   06/20/7251 66:44 AM 165 (H) 80 - 99 mg/dL Final       . aspirin  81 mg Oral Daily   . atorvastatin  20 mg Oral QPM   . heparin  5,000 Units Subcutaneous  Q8H SCH   . hydrocortisone   Topical 2 times per day   . insulin aspart   Subcutaneous 4x Daily - AC and HS   . insulin aspart   Subcutaneous TID with meals   . insulin glargine  40 Units Subcutaneous Nightly   . metoprolol tartrate  150 mg Oral 2 times per day   . polyethylene glycol  17 g Oral Daily   . senna-docusate  2 tablet Oral 2 times per day   . sodium chloride 0.9 % (NS) syringe  5-10 mL Intravenous Q8H [facility]         Physical Exam:  Alert and oriented  Lungs clear bilaterally  Regular rate and rhythm  Abdomen nontender, nondistended  Musculoskeletal: 2+ edema bilaterally  Skin: Erythema on the left lower leg, trunk, and head    Recent Labs:  Results for orders placed or performed during the hospital encounter of  07/11/16 (from the past 24 hour(s))   Staph aureus (MRSA/MSSA) Skin/Soft tissue PCR -STAT    Collection Time: 07/21/16 11:05 AM   Result Value Ref Range    Skin/Soft Tissue MRSA Not Detected Not Detected    Skin/Soft Tissue Staph Aureus Not Detected Not Detected   Wound Culture, Superficial (Aerobic) -Next Routine    Collection Time: 07/21/16 11:05 AM   Result Value Ref Range    Gram Stain No polymorphonuclear leukocytes seen     Gram Stain No organisms seen    POCT Glucose -Next Routine    Collection Time: 07/21/16 11:38 AM   Result Value Ref Range    POC Glucose 247 (H) 80 - 99 mg/dL   POCT Glucose -Next Routine    Collection Time: 07/21/16  4:35 PM   Result Value Ref Range    POC Glucose 133 (H) 80 - 99 mg/dL   POCT Glucose -Next Routine    Collection Time: 07/21/16  9:35 PM   Result Value Ref Range    POC Glucose 218 (H) 80 - 99 mg/dL   Renal Function Panel -Daily    Collection Time: 07/22/16  5:05 AM   Result Value Ref Range    Albumin 3.0 (L) 3.5 - 5.0 g/dL    Calcium 8.8 8.6 - 09.8 mg/dL    Phosphorus 6.1 (H) 2.5 - 4.5 mg/dL    BUN 119 (H) 6 - 23 mg/dL    Creatinine 1.47 (H) 0.65 - 1.30 mg/dL    Sodium 829 562 - 130 mmol/L    Glucose 159 (H) 80 - 99 mg/dL    Potassium 5.0 3.5 - 5.1 mmol/L     Chloride 110 98 - 111 mmol/L    CO2 - Carbon Dioxide 14.3 (L) 21.0 - 31.0 mmol/L    Anion Gap 13.7 (H) 3.0 - 11.0 mmol/L    GFR Estimate 14 (L) >=60 mL/min/1.34m*2    GFR Additional Info     CBC with Auto Differential -AM Draw    Collection Time: 07/22/16  5:05 AM   Result Value Ref Range    WBC 17.6 (H) 4.8 - 10.8 10*3/?L    RBC 3.22 (L) 4.70 - 6.10 10*6/?L    Hemoglobin 10.7 (L) 12.0 - 18.0 g/dL    HCT 86.5 (L) 78.4 - 54.0 %    MCV 101.5 (H) 81.0 - 99.0 fL    MCH 33.2 27.0 - 34.0 pg    MCHC 32.8 32.0 - 36.0 g/dL    RDW 69.6 29.5 - 28.4 %    Platelet Count 323 150 - 400 10*3/?L    MPV 9.1 7.4 - 10.4 fL    Neutrophils % 84 (H) 35 - 70 %    Bands % 1 0 - 10 %    Lymphocytes % 7 (L) 25 - 45 %    Monocytes % 1 0 - 12 %    Eosinophils % 7 0 - 7 %    Basophils % 0 0 - 2 %    Neutrophils, Absolute 14.8 (H) 1.6 - 7.3 10*3/?L    Bands, Absolute 0.2 0.0 - 1.2 10*3/?L    Lymphocytes, Absolute 1.2 1.1 - 4.3 10*3/?L    Monocytes, Absolute 0.2 0.0 - 1.2 10*3/?L    Eosinophils, Absolute 1.2 (H) 0.0 - 0.7 10*3/?L    Basophils, Absolute 0.0 0.0 - 0.2 10*3/?L    Differential Type Manual Differential     Platelet Estimate Normal Normal    Macrocytes 1+ (A) (none)  Basic Metabolic Panel -AM Draw    Collection Time: 07/22/16  5:05 AM   Result Value Ref Range    Sodium 138 135 - 143 mmol/L    Potassium 5.0 3.5 - 5.1 mmol/L    Chloride 110 98 - 111 mmol/L    CO2 - Carbon Dioxide 14.3 (L) 21.0 - 31.0 mmol/L    Glucose 159 (H) 80 - 99 mg/dL    BUN 161 (H) 6 - 23 mg/dL    Creatinine 0.96 (H) 0.65 - 1.30 mg/dL    Calcium 8.8 8.6 - 04.5 mg/dL    Anion Gap 40.9 (H) 3.0 - 11.0 mmol/L    GFR Estimate 14 (L) >=60 mL/min/1.59m*2    GFR Additional Info     ESR (Sed Rate) -AM Draw    Collection Time: 07/22/16  5:05 AM   Result Value Ref Range    Sed Rate 44 (H) 0 - 20 mm/hr   C-reactive protein -AM Draw    Collection Time: 07/22/16  5:05 AM   Result Value Ref Range    CRP 2.30 (H) <1.00 mg/dL   Vancomycin, Random -AM Draw    Collection Time:  07/22/16  5:05 AM   Result Value Ref Range    Vancomycin, Timed 15.2 10.0 - 20.0 ?g/mL   POCT Glucose -Next Routine    Collection Time: 07/22/16  5:44 AM   Result Value Ref Range    POC Glucose 161 (H) 80 - 99 mg/dL       Recent Imaging:  No results found.    Assessment and plan:     71 year old with history of hypertension, COPD, diabetes, who presented with erythema and tenderness of his left lower extremity.  ?  Acute kidney injury:  Most likely represents contrast nephropathy secondary to contrast exposure1/26.  Baseline creatinine is around 1.5, was 1.8 on admission, peaked at 5.1.  Today improved to 4.3, UOP remains excellent.  Continue supportive care.    Metabolic acidosis: plausibly related to renal failure.  Check ABG, will start oral bicarb if acidosis confirmed.  ?  ===Per hospitalists:   -diffuse rash, possible drug reaction  -Diastolic congestive heart failure, echo indicating RV overload. Moderate probability noninvasive stress test  -DM2, erratic glucose control      Plan of care was discussed with patient.      Starr [place] Jamayia Croker  [facility]  07/22/2016

## 2016-07-22 NOTE — Treatment Summary (Signed)
IP PT TREATMENT NOTE     Patient:  Evan [name] / [id number] DOB:  [birth year] / 71 y.o. Date:  07/22/2016     Precautions  Specific mobility precautions: None  Other: Fall     ASSESSMENT     Summary:  Pt agrees to PT, (even though the Super Bowl has started.) OOB with SBA. Ambulate 62' with FWW and CGA. Pt c/o knees beginning to feel like they might buckle, prior to reaching room. Able to maintain upright without buckling. BTB with Mod A to lift LLE up onto bed. Continue PT.     Able to be up with Nursing Team (x 1).     Activity tolerance: Tolerates 30 min activity with multiple rests     Precaution Awareness: No specific precautions  Deficit Awareness: Fully aware of deficits  Correction of Errors: Self-corrects with cues  Safety Judgment: Good awareness of safety     Consult recommendations  Pt. would benefit from Discharge Planner consult.   Discharge recommendations  Mobility Equipment needs: Front wheeled walker (FWW)   Primary recommendation : Patient would benefit from SNF/Nursing Home/Other Skilled Facility.  Recommended transport method: Wheelchair  Secondary recommendation: Patient would benefit from Home Health PT.  Recommended transport method: Wheelchair  Justification: Progressive gait, balance, strengthening and mobility activities for safety and independence with retun to home      PLAN     Treatment Plan Frequency    Plan: Continue per established POC.  Pt. demonstrates limitations which require skilled PT intervention.  1x (5 days/week).  Frequency will be increased as pt's condition or discharge plan indicates.     [name] will remain on the Physical Therapy schedule until goals are met, or there has been a change in the Plan of Care.          Treatment Diagnosis Surgery / # Days Post-op (if applicable)    Primary Dx: Sepsis, cellulitis  Treatment Dx: Impaired mobility   /         SUBJECTIVE     Pain Level  Current status: Pain somewhat limits pt's ability to participate in  therapy  Pain location: Leg     OBJECTIVE     Mental status  General Demeanor: Pleasant;Cooperative  LOC: Alert  Orientation: Oriented x 4  Directions: Follows multi-step commands  --> Follows multi-step commands: Consistently  Attention span: Appears intact  Memory: Appears intact in social/therapy situations     TREATMENT  Bed Mobility/Transfers  Bed mobility       Rolling / logroll         Transition to sitting up Sidelying to Sit: SBA  Supine to Sit: SBA   To resting position      Transition to sitting down SBA     Transition to supine Mod Assist     Scooting SBA (with use of bedrails)   Transfers      Transition to standing From bed: SBA     Method        Gait/Stairs  Mobility - Gait (Level)  Gait (distance): 60 feet  Gait (assist level): CGA  Assistive device: Front wheeled walker (FWW)  Endurance: fair  Pace: Slow, steady  Pattern: Wide BOS;Excessive forward lean;Guarded;Increased upper body tension  Surface: Level tile    Ther. Ex.  LE exercises: Ankle pumps;Heelslides;Hip abd/adduction  Ankle pumps: 10  Heelslides: 10 with assist on L  Hip abd/adduction: 10 with assist on L.    Balance  Training/Education      Safety/Room Set-up   Call button accessible?: Yes  Phone accessible?: Yes  Oxygen reconnected?: No, on room air  Patient mobility in room: Able to be up with Nursing Team (x 1).  Position on arrival: In bed  Position on departure (bed): Bed alarm on;Bed in lowest position;3 bedrails up     GOALS     Patient/Caregiver goals reviewed and integrated with rehab treatment plan:      Multidisciplinary Problems (Active)        Problem: IP General Goal List - 2    Goal Priority Disciplines Outcome   Bed Mobility     PT    Description:  Pt. to perform bed mobility with min assist.    Transfers     PT    Description:  Pt. to perform all functional transfers with min. assist.    Ambulation - Levels     PT    Description:  Pt. to ambulate with min assist x 50 feet with appropriate assistive device.       Ambulation - Stairs     PT    Description:  Pt. to ascend/descend stairs with min assist, as per discharge environment.    Home Exercise Program     PT    Description:  Pt. to perform basic HEP with min assist.    Caregiver Training     PT    Description:  Patient/caregiver training will be provided, as needed to achieve goals.             Problem: Patient/Family Goal    Goal Priority Disciplines Outcome   Normal Status     PT    Description:  Pt. wants to return to previous level of function.     Home     PT    Description:  Pt. wants to return to previous living situation.    Mobility     PT    Description:  Patient wants to walk again.                        G-Codes/PSFS/FIM Scores: (if applicable)          First session Second session (if applicable)    Start/Stop times: 1540 - 1611 Start/Stop times:   - Stop Time:     Total time: 31 minutes  Total time:   minutes     This note to serve as a Discharge Summary if Goebel Hellums is discharged from the hospital or from therapy services.     Therapist:  Burgess Estelle, PT

## 2016-07-22 NOTE — Progress Notes (Signed)
Daily Progress Note   Name: Evan [name]  DOB: 1946/01/03 71 y.o.  MRN: [id number]  CSN: [id number]  LKG:[id number] [name]    Subjective:   Chief Complaint:  Cellulitis, acute renal failure, leg edema, drug rash  ?  Evan [name] is a 71 y.o. male patient. Past medical history of morbid obesity, hypertension, sleep apnea on CPAP, chronic kidney disease stage III, type 2 diabetes mellitus on insulin. Presented with increasing weakness, fever and a syncopal episode. EMS called and he was hypoxic and was found to be septic with lactic acid of 4. He had acute kidney injury on presentation with creatinine up to 1.8 and elevated troponin at 0.11 and subsequently peaked at 7.7. He did not have chest pain. He complained of feeling weak with cough and shortness of breath. Stress test was moderate probability for underlying CAD. Cardiology recommended medical management unless he was having ongoing chest pain. Blood sugars were elevated and his Lantus was increased. Echocardiogram showed right heart strain but the patient has history of obstructive sleep apnea and obesity.. He had ultrasound of the lower extremities which did not show evidence of DVT. CT pulmonary angiogram showed no evidence of pulmonary emboli. Reportedly the patient developed contrast-induced nephropathy. His creatinine peaked at 5.1 nephrology consulted.     Denies any headache or visual changes, no chest pain or shortness of breath, rashes improving itching is controlled by Benadryl. No oral lesions. No difficulty with vision. No fever or chills  Objective:     Vitals Current 24 Hour Min / Max      Temp    36.6 ?C (97.8 ?F)    Temp  Min: 36.3 ?C (97.4 ?F)  Max: 37.2 ?C (98.9 ?F)      BP     122/70     BP  Min: 118/68  Max: 130/70      HR    62    Pulse  Min: 60  Max: 69      RR    20    Resp  Min: 19  Max: 20      Sats      SpO2: 98 % on  L/min       SpO2  Min: 96 %  Max: 100 %      Weight    (!) 180.3 kg (397 lb 7.8 oz)  Body mass index is 52.16  kg/m?[name]    Admit: (!) 181.4 kg (400 lb)   Intake/Ouput:    Intake/Output Summary (Last 24 hours) at 07/22/16 0904  Last data filed at 07/22/16 2536   Gross per 24 hour   Intake             1612 ml   Output             2470 ml   Net             -858 ml      Drains output:       Exam:  Constitutional: No acute distress morbidly obese gentleman  Eyes:  PER  HENT:   supple   Respiratory:  No respiratory distress. On CPAP  Cardiovascular:  Normal rate, normal rhythm, no murmurs, no gallops, no rubs   GI:  Soft, nontender  Musculoskeletal: bilateral leg edema albeit slightly improving  Neurologic:  Alert & oriented x 3,  no focal deficits noted   Psychiatric:  Speech and behavior appropriate   Skin: Erythema of the left lower extremity  Scheduled Medications:  . aspirin  81 mg Oral Daily   . atorvastatin  20 mg Oral QPM   . heparin  5,000 Units Subcutaneous Q8H SCH   . hydrocortisone   Topical 2 times per day   . insulin aspart   Subcutaneous 4x Daily - AC and HS   . insulin aspart   Subcutaneous TID with meals   . insulin glargine  40 Units Subcutaneous Nightly   . metoprolol tartrate  150 mg Oral 2 times per day   . polyethylene glycol  17 g Oral Daily   . senna-docusate  2 tablet Oral 2 times per day   . sodium chloride 0.9 % (NS) syringe  5-10 mL Intravenous Q8H SCH     Infusions:   PRN Medications: acetaminophen, benzocaine-menthol, bisacodyl, dextrose, dextrose, dextrose, diphenhydrAMINE, diphenhydrAMINE, glucagon (human recombinant), HYDROcodone-acetaminophen, magnesium hydroxide, melatonin, nicotine polacrilex, phenol, polyvinyl alcohol, regadenoson, sodium chloride 0.9 % (NS) syringe, urea  Recent Labs:  Results for orders placed or performed during the hospital encounter of 07/11/16 (from the past 24 hour(s))   Staph aureus (MRSA/MSSA) Skin/Soft tissue PCR -STAT    Collection Time: 07/21/16 11:05 AM   Result Value Ref Range    Skin/Soft Tissue MRSA Not Detected Not Detected    Skin/Soft Tissue Staph Aureus  Not Detected Not Detected   Wound Culture, Superficial (Aerobic) -Next Routine    Collection Time: 07/21/16 11:05 AM   Result Value Ref Range    Gram Stain No polymorphonuclear leukocytes seen     Gram Stain No organisms seen    POCT Glucose -Next Routine    Collection Time: 07/21/16 11:38 AM   Result Value Ref Range    POC Glucose 247 (H) 80 - 99 mg/dL   POCT Glucose -Next Routine    Collection Time: 07/21/16  4:35 PM   Result Value Ref Range    POC Glucose 133 (H) 80 - 99 mg/dL   POCT Glucose -Next Routine    Collection Time: 07/21/16  9:35 PM   Result Value Ref Range    POC Glucose 218 (H) 80 - 99 mg/dL   Renal Function Panel -Daily    Collection Time: 07/22/16  5:05 AM   Result Value Ref Range    Albumin 3.0 (L) 3.5 - 5.0 g/dL    Calcium 8.8 8.6 - 16.1 mg/dL    Phosphorus 6.1 (H) 2.5 - 4.5 mg/dL    BUN 096 (H) 6 - 23 mg/dL    Creatinine 0.45 (H) 0.65 - 1.30 mg/dL    Sodium 409 811 - 914 mmol/L    Glucose 159 (H) 80 - 99 mg/dL    Potassium 5.0 3.5 - 5.1 mmol/L    Chloride 110 98 - 111 mmol/L    CO2 - Carbon Dioxide 14.3 (L) 21.0 - 31.0 mmol/L    Anion Gap 13.7 (H) 3.0 - 11.0 mmol/L    GFR Estimate 14 (L) >=60 mL/min/1.95m*2    GFR Additional Info     CBC with Auto Differential -AM Draw    Collection Time: 07/22/16  5:05 AM   Result Value Ref Range    WBC 17.6 (H) 4.8 - 10.8 10*3/?L    RBC 3.22 (L) 4.70 - 6.10 10*6/?L    Hemoglobin 10.7 (L) 12.0 - 18.0 g/dL    HCT 78.2 (L) 95.6 - 54.0 %    MCV 101.5 (H) 81.0 - 99.0 fL    MCH 33.2 27.0 - 34.0 pg    MCHC 32.8 32.0 -  36.0 g/dL    RDW 16.1 09.6 - 04.5 %    Platelet Count 323 150 - 400 10*3/?L    MPV 9.1 7.4 - 10.4 fL    Neutrophils % 84 (H) 35 - 70 %    Bands % 1 0 - 10 %    Lymphocytes % 7 (L) 25 - 45 %    Monocytes % 1 0 - 12 %    Eosinophils % 7 0 - 7 %    Basophils % 0 0 - 2 %    Neutrophils, Absolute 14.8 (H) 1.6 - 7.3 10*3/?L    Bands, Absolute 0.2 0.0 - 1.2 10*3/?L    Lymphocytes, Absolute 1.2 1.1 - 4.3 10*3/?L    Monocytes, Absolute 0.2 0.0 - 1.2 10*3/?L     Eosinophils, Absolute 1.2 (H) 0.0 - 0.7 10*3/?L    Basophils, Absolute 0.0 0.0 - 0.2 10*3/?L    Differential Type Manual Differential     Platelet Estimate Normal Normal    Macrocytes 1+ (A) (none)   Basic Metabolic Panel -AM Draw    Collection Time: 07/22/16  5:05 AM   Result Value Ref Range    Sodium 138 135 - 143 mmol/L    Potassium 5.0 3.5 - 5.1 mmol/L    Chloride 110 98 - 111 mmol/L    CO2 - Carbon Dioxide 14.3 (L) 21.0 - 31.0 mmol/L    Glucose 159 (H) 80 - 99 mg/dL    BUN 409 (H) 6 - 23 mg/dL    Creatinine 8.11 (H) 0.65 - 1.30 mg/dL    Calcium 8.8 8.6 - 91.4 mg/dL    Anion Gap 78.2 (H) 3.0 - 11.0 mmol/L    GFR Estimate 14 (L) >=60 mL/min/1.71m*2    GFR Additional Info     ESR (Sed Rate) -AM Draw    Collection Time: 07/22/16  5:05 AM   Result Value Ref Range    Sed Rate 44 (H) 0 - 20 mm/hr   C-reactive protein -AM Draw    Collection Time: 07/22/16  5:05 AM   Result Value Ref Range    CRP 2.30 (H) <1.00 mg/dL   Vancomycin, Random -AM Draw    Collection Time: 07/22/16  5:05 AM   Result Value Ref Range    Vancomycin, Timed 15.2 10.0 - 20.0 ?g/mL   POCT Glucose -Next Routine    Collection Time: 07/22/16  5:44 AM   Result Value Ref Range    POC Glucose 161 (H) 80 - 99 mg/dL     Pending Labs     Order Current Status    Wound Culture, Superficial (Aerobic) -Next Routine Preliminary result        Recent Imaging:  No results found.      ASSESMENT:     - Severe sepsis on presentation. resolved  ?  - Left lower extremity cellulitis  I believe persistent edema is the complicating factor resulting in slow improvement  ESR and CRP mildly elevated but no baseline to compare to    - Leukocytosis: Initially improved but now worsened since 1/28. Persistent cellulitis versus prednisone  ?  - Syncope. No clear etiology. Could've been due to sepsis and hypotension. CT chest did not show evidence of PE. Echocardiogram showed evidence of right heart strain but the patient has underlying history of morbid obesity and obstructive sleep  apnea.   ?  - Non-STEMI. Troponin peaked at 7.7. Stress test showed moderate likelihood for flow-limiting CAD . Dr. Marisue Humble discussed with  cardiology and they recommended medical management unless the patient was having ongoing chest pain.   ?  - Acute kidney injury superimposed on chronic kidney disease stage III. Baseline creatinine 1.3 . Suspected secondary to contrast nephropathy. Nephrology managin. Creatinine peaked at 5.10 and improving.   ?  - Hyperkalemia. Improved  ?  - Type 2 diabetes mellitus on insulin with hyperglycemia. Metformin and Amaryl on hold. Glucose better controlled now after discontinuation of steroids  ?  - Edema. Most likely volume overload with acute kidney injury and right heart failure  ?  - Maculopapular rash. Most likely drug rash suspected due to Levaquin    - Morbid obesity      PLAN:     - The patient received several days of cephalosporin and Levaquin with slow improvement of cellulitis of the left lower extremity possibly complicated by persistent leg edema and diabetes. Switched to coverage with vancomycin. Although MRSA screen is negative I will continue vancomycin pharmacy to dose and watch for clinical improvement although it looks like edema is the main problem here and I believe with improving kidney function and diuresis of the right heart failure will be more controlled resulting in decreasing edema and more rapid improvement of erythema and cellulitis of the left lower extremity.   ?  - continue ASA , statin and beta blocker for medical management of CAD    - IV Lasix 40 mg once  ?  - Management of renal function/acid base status and lytes per nephrology  ?  - Regarding  drug rash. I stopped prednisone to avoid further hyperglycemia and treat with Benadryl symptomatically  ?  - Continue current insulin regimen  ?  - Continue home CPAP.   ?  - Wound care  ?  - Physical therapy  ?  DVT prophylaxis: Heparin subcutaneous   LOS: 10 days  Days  Total time spent: 29  min  Electronically signed:Wilgus Deyton Abdulhadi2/4/20189:04 AM  This dictation was produced using voice recognition software. Despite concurrent proofreading, please note transcription errors may be present and that these errors may not truly reflect my intent.

## 2016-07-22 NOTE — Progress Notes (Signed)
End of Shift Report   Significant Events/Was the physician notified?  none   Relevant Assessment Findings Pt able to amb assist x 1 to BR with FWW.  See PT notes.   Patient/Family Questions/Concerns Dtr would like an update, RN updated and answered questions.   DC Barriers Cellulitis/Vanco IV.   Medical-Surgical    Adequate urine output?   If Foley in place state indication Strict I & Os.   Is the patient eating/drinking adequately? Good appetite.   Date and time of last BM? Stool Occurrence: Yes (07/22/16 1636)       Drain in place? Output?       What is mobility status/PT recommendations/Fall risk?  Fall Risk: High     IV or Central Line?   Due dates for IV or CL?  If CL in place state indication                     Peripheral IV 07/21/16 Right Upper Arm (Active)   Placement Date: 07/21/16   IV Change Due: 07/25/16  Size (Gauge): 22 G  Orientation: Right  Location: Upper Arm  Site Prep: Chlorhexidine   Inserted by: Greggory Stallion RN  Total # of attempts: 2  Patient Tolerance: Tolerated well        Pain Control/Last dose given Pain Score: 0-No pain (07/22/16 1625)     Other Miscellaneous/Pertinent Info

## 2016-07-22 NOTE — Progress Notes (Signed)
PHARMACIST PROGRESS NOTE     The following pharmacy services were provided: anti-infective interventions, renal dosing and vancomycin dosing    SUBJECTIVE / OBJECTIVE     Jasten JATERRIUS RICKETSON is a 71 y.o. Male admitted on 07/11/2016.    I have reviewed available relevant labs, vitals and flowsheets today.    ASSESSMENT       Anti-infective Interventions  Indication(s):  LLE cellulitis with hx of CKD and DM II  Relevant microbiology:  01/28 leg wound superficial:  NG - final  01/24 blood x 4 - anaerobic bottle with Staph epidermidis - contaminate - final  01/24 blood PCR - negative for MRSA    Current anti-infective(s):  Vancomycin IV PTD (02/03-)  Relevant previous anti-infective(s):  Unasyn 3 G IV Q6H (2/2-2/3); Levofloxacin 750 mg IV Q48H (1/27-2/2); Vancomycin PTD (1/25-1/26); ceftriaxone 1 G IV Daily (1/25-1/27); levofloxacin 750 mg Q48H; cefepime 2 G IV x 1 (1/24)  Days of therapy appropriate for indication:  5-14 days depending on clinical improvement  Reference(s):  UpToDate / LexiComp    Kinetic Dosing  Renal function appears to be improving.    Renal Dosing  The following medications have been renally adjusted and do not require change:      PLAN       Anti-infective Interventions  Pharmacy to monitor for culture data/placement of stop date.  Provider ordered ESR and CRP for tomorrow AM.    Kinetics  Next vancomycin level scheduled for:  Random level 02/04 with AM labs  Colleague was hesitant to do a full 15 mg/kg, per protocol, due to obesity and declining renal function.  Pulse dose patient at 2000 mg (~ 11 mg/kg) resulted in a random level of 15.2 this am.  I will redose at 1250mg  IV x 1 and redraw random level with 2/5 am labs    Renal Dosing  BMP already on order by provider.       Signed by: Nolon Bussing Maisie Fus [facility], PharmD

## 2016-07-23 LAB — POCT GLUCOSE
POC Glucose: 232 mg/dL — ABNORMAL HIGH (ref 80–99)
POC Glucose: 191 mg/dL — ABNORMAL HIGH (ref 80–99)
POC Glucose: 153 mg/dL — ABNORMAL HIGH (ref 80–99)
POC Glucose: 192 mg/dL — ABNORMAL HIGH (ref 80–99)

## 2016-07-23 LAB — RENAL FUNCTION PANEL
Albumin: 3.2 g/dL — ABNORMAL LOW (ref 3.5–5.0)
Anion Gap: 12.4 mmol/L — ABNORMAL HIGH (ref 3.0–11.0)
BUN: 109 mg/dL — ABNORMAL HIGH (ref 6–23)
CO2 - Carbon Dioxide: 19.6 mmol/L — ABNORMAL LOW (ref 21.0–31.0)
Calcium: 8.9 mg/dL (ref 8.6–10.3)
Chloride: 108 mmol/L (ref 98–111)
Creatinine: 4.3 mg/dL — ABNORMAL HIGH (ref 0.65–1.30)
GFR Estimate: 14 mL/min/{1.73_m2} — ABNORMAL LOW (ref >=60)
Glucose: 160 mg/dL — ABNORMAL HIGH (ref 80–99)
Phosphorus: 5.7 mg/dL — ABNORMAL HIGH (ref 2.5–4.5)
Potassium: 4.6 mmol/L (ref 3.5–5.1)
Sodium: 140 mmol/L (ref 135–143)

## 2016-07-23 LAB — CBC WITH AUTO DIFFERENTIAL
Basophils %: 1 % (ref 0–2)
Basophils, Absolute: 0.2 10*3/ÂµL (ref 0.0–0.2)
Eosinophils %: 8 % — ABNORMAL HIGH (ref 0–7)
Eosinophils, Absolute: 1.5 10*3/ÂµL — ABNORMAL HIGH (ref 0.0–0.7)
HCT: 34.8 % — ABNORMAL LOW (ref 42.0–54.0)
Hemoglobin: 11.5 g/dL — ABNORMAL LOW (ref 12.0–18.0)
Lymphocytes %: 5 % — ABNORMAL LOW (ref 25–45)
Lymphocytes, Absolute: 0.9 10*3/ÂµL — ABNORMAL LOW (ref 1.1–4.3)
MCH: 33.7 pg (ref 27.0–34.0)
MCHC: 33 g/dL (ref 32.0–36.0)
MCV: 102 fL — ABNORMAL HIGH (ref 81.0–99.0)
MPV: 8.7 fL (ref 7.4–10.4)
Monocytes %: 3 % (ref 0–12)
Monocytes, Absolute: 0.6 10*3/ÂµL (ref 0.0–1.2)
Neutrophils %: 83 % — ABNORMAL HIGH (ref 35–70)
Neutrophils, Absolute: 15.4 10*3/??L — ABNORMAL HIGH (ref 1.6–7.3)
Platelet Count: 348 10*3/ÂµL (ref 150–400)
Platelet Estimate: NORMAL
RBC Morphology: NORMAL
RBC: 3.41 10*6/ÂµL — ABNORMAL LOW (ref 4.70–6.10)
RDW: 14.2 % (ref 11.5–14.5)
WBC: 18.6 10*3/ÂµL — ABNORMAL HIGH (ref 4.8–10.8)

## 2016-07-23 LAB — VANCOMYCIN, RANDOM: Vancomycin, Timed: 21.6 ug/mL — ABNORMAL HIGH (ref 10.0–20.0)

## 2016-07-23 MED ORDER — furosemide (LASIX) tablet 60 mg
20 | Freq: Every day | ORAL | Status: DC
Start: 2016-07-23 — End: 2016-07-31
  Administered 2016-07-23 – 2016-07-30 (×8): 20 mg via ORAL

## 2016-07-23 MED ORDER — ceFAZolin (ANCEF) 1 g in D5W IVPB Premix
1 | Freq: Two times a day (BID) | INTRAVENOUS | Status: DC
Start: 2016-07-23 — End: 2016-07-28
  Administered 2016-07-23: 21:00:00 150 g via INTRAVENOUS
  Administered 2016-07-23: 20:00:00 1 g via INTRAVENOUS
  Administered 2016-07-24 (×2): 150 g via INTRAVENOUS
  Administered 2016-07-24 (×2): 1 g via INTRAVENOUS
  Administered 2016-07-24 (×2): 150 g via INTRAVENOUS
  Administered 2016-07-25 (×2): 1 g via INTRAVENOUS
  Administered 2016-07-25 – 2016-07-26 (×3): 150 g via INTRAVENOUS
  Administered 2016-07-26: 07:00:00 1 g via INTRAVENOUS
  Administered 2016-07-26: 20:00:00 150 g via INTRAVENOUS
  Administered 2016-07-26: 19:00:00 1 g via INTRAVENOUS
  Administered 2016-07-27 (×2): 150 g via INTRAVENOUS
  Administered 2016-07-27 – 2016-07-28 (×3): 1 g via INTRAVENOUS
  Administered 2016-07-28: 08:00:00 150 g via INTRAVENOUS
  Administered 2016-07-28: 07:00:00 1 g via INTRAVENOUS
  Administered 2016-07-28: 20:00:00 150 g via INTRAVENOUS

## 2016-07-23 MED ORDER — ceFAZolin (ANCEF) 2 g in D5W IVPB Premix
2 | Freq: Three times a day (TID) | INTRAVENOUS | Status: DC
Start: 2016-07-23 — End: 2016-07-23

## 2016-07-23 MED FILL — ATORVASTATIN 20 MG TABLET: 20 mg | ORAL | Qty: 1

## 2016-07-23 MED FILL — DIPHENHYDRAMINE 25 MG CAPSULE: 25 mg | ORAL | Qty: 1

## 2016-07-23 MED FILL — FUROSEMIDE 20 MG TABLET: 20 mg | ORAL | Qty: 1

## 2016-07-23 MED FILL — CEFAZOLIN 1 GRAM/50 ML IN DEXTROSE (ISO-OSMOTIC) INTRAVENOUS PIGGYBACK: 1 gram/50 mL | INTRAVENOUS | Qty: 50

## 2016-07-23 MED FILL — HEPARIN, PORCINE (PF) 5,000 UNIT/0.5 ML SYRINGE: 5000 unit/0.5 mL | INTRAMUSCULAR | Qty: 0.5

## 2016-07-23 MED FILL — SENNA PLUS 8.6 MG-50 MG TABLET: 8.6-50 mg | ORAL | Qty: 2

## 2016-07-23 MED FILL — LANTUS U-100 INSULIN 100 UNIT/ML SUBCUTANEOUS SOLUTION: 100 [IU]/mL | SUBCUTANEOUS | Qty: 0.4

## 2016-07-23 MED FILL — METOPROLOL TARTRATE 50 MG TABLET: 50 mg | ORAL | Qty: 3

## 2016-07-23 MED FILL — CEFAZOLIN 2 GRAM/50 ML IN DEXTROSE (ISO-OSMOTIC) INTRAVENOUS PIGGYBACK: 2 gram/50 mL | INTRAVENOUS | Qty: 50

## 2016-07-23 MED FILL — FUROSEMIDE 40 MG TABLET: 40 mg | ORAL | Qty: 1

## 2016-07-23 MED FILL — MIRALAX 17 GRAM ORAL POWDER PACKET: 17 g | ORAL | Qty: 1

## 2016-07-23 MED FILL — HYDROCORTISONE 1 % TOPICAL CREAM: 1 % | TOPICAL | Qty: 28

## 2016-07-23 MED FILL — ASPIRIN 81 MG TABLET,ENTERIC COATED: 81 mg | ORAL | Qty: 1

## 2016-07-23 NOTE — Progress Notes (Signed)
End of Shift Report     Significant Events None.     Relevant   Assessment Findings Diminished lung sounds bilaterally. +2 to +3 pitting edema lower extremities bilaterally. Rash and redness covering entire body, back, lower and upper extremities, trunk and head. Dressing on leg clean dry and intact.       Patient/Family  Questions/Concerns None stated from pt     DC Barriers    General Medicine Patients       Is the patient voiding or having normal catheter output? Yes      Is the patient eating/drinking adequately? Yes      Last bowel movement?   2/4      What is the stability of patient?  VSS      How is pain managed and controlled? (last dose) No pain meds wanted       Calls made to patient care provider   N/A      New orders related to changes in patient condition?  N/A       Miscellaneous information from the shift N/A

## 2016-07-23 NOTE — Treatment Summary (Signed)
IP PT TREATMENT NOTE     Patient:  Evan [name] / [id number] DOB:  Jun 11, 1946 / 71 y.o. Date:  07/23/2016     Precautions  Specific mobility precautions: None  Other: Fall     ASSESSMENT     Summary:  Pt displays improving activity tolerance, but gait is slow and labored and he requires numerous standing rest breaks  No LOB and slight dizziness with initial sitting  Standing rest breaks due to SOB which reduces quickly with standing/pursed lip breathring  At his current status, pt will benefit from interim placement and ongoing low intensity PT for strengthening, balance and progressive gait activities, prior to return to home     Able to be up with Nursing Team (x 1).     Activity tolerance: Tolerates 30 min activity with multiple rests  Comments: Requires standing rest breaks     Precaution Awareness: No specific precautions  Deficit Awareness: Fully aware of deficits  Correction of Errors: Self-corrects with cues  Safety Judgment: Good awareness of safety     Consult recommendations  Pt. would benefit from Discharge Planner consult.   Discharge recommendations  Mobility Equipment needs: Front wheeled walker (FWW)   Primary recommendation : Patient would benefit from SNF/Nursing Home/Other Skilled Facility.  Recommended transport method: Wheelchair  Secondary recommendation: Patient would benefit from Home Health PT.  Recommended transport method: Wheelchair  Justification: Progressive gait, balance, strengthening and mobility activities for safety and independence with retun to home      PLAN     Treatment Plan Frequency    Plan: Continue per established POC.  Pt. demonstrates limitations which require skilled PT intervention.  1x (5 days/week).  Frequency will be increased as pt's condition or discharge plan indicates.     Evan [name] will remain on the Physical Therapy schedule until goals are met, or there has been a change in the Plan of Care.          Treatment Diagnosis Surgery / # Days Post-op  (if applicable)    Primary Dx: Sepsis, cellulitis  Treatment Dx: Impaired mobility   /         SUBJECTIVE   Reviewed chart and nursing approved PT at this time. Pt supine, HOB raised, he was sleeping, but awoke and was agreeable to working with PT.     Pain Level  Current status: Pain does not limit pt's ability to participate in therapy  Pain at start of session: 0 - No Pain  Pain at end of session: 0 - No Pain     OBJECTIVE   LE exercises, sitting/standing balance, transfer and gait activities (See below)     Mental status  General Demeanor: Pleasant;Cooperative  LOC: Alert  Orientation: Oriented x 4  --> Follows multi-step commands: Consistently  Attention span: Appears intact  Memory: Appears intact in social/therapy situations       TREATMENT  Bed Mobility/Transfers  Bed mobility       Rolling / logroll  Rolling R: SBA      Transition to sitting up Sidelying to Sit: SBA   To resting position      Transition to sitting down SBA     Transition to supine       Scooting     Transfers      Transition to standing From bed: SBA     Method Stand pivot: SBA      Gait/Stairs  Mobility - Gait (Level)  Gait (distance): 85 feet  Gait (assist level): CGA  Assistive device: Front wheeled walker (FWW)  Endurance: Fair  Pace: Slow  Pattern: Wide BOS;Excessive forward lean;Guarded;Increased upper body tension;Shuffling;Decreased foot clearance R;Decreased foot clearance L;Decreased step length R;Decreased step length L    Ther. Ex.  Ankle pumps: 10  Quad sets: 10  Gluteal sets: 10  Seated long arc quads: 10    Balance   No LOB     Safety/Room Set-up   Call button accessible?: Yes  Phone accessible?: Yes  Oxygen reconnected?: No, on room air  Patient mobility in room: Able to be up with Nursing Team (x 1).  Position on arrival: In bed  Position on departure (upright): In recliner chair  Comments: Nursing aware pt is up in chair     GOALS     Patient/Caregiver goals reviewed and integrated with rehab treatment plan:       Multidisciplinary Problems (Active)        Problem: IP General Goal List - 2    Goal Priority Disciplines Outcome   Bed Mobility     PT    Description:  Pt. to perform bed mobility with min assist.    Transfers     PT    Description:  Pt. to perform all functional transfers with min. assist.    Ambulation - Levels     PT    Description:  Pt. to ambulate with min assist x 50 feet with appropriate assistive device.    Ambulation - Stairs     PT    Description:  Pt. to ascend/descend stairs with min assist, as per discharge environment.    Home Exercise Program     PT    Description:  Pt. to perform basic HEP with min assist.    Caregiver Training     PT    Description:  Patient/caregiver training will be provided, as needed to achieve goals.             Problem: Patient/Family Goal    Goal Priority Disciplines Outcome   Normal Status     PT    Description:  Pt. wants to return to previous level of function.     Home     PT    Description:  Pt. wants to return to previous living situation.    Mobility     PT    Description:  Patient wants to walk again.                          First session Second session (if applicable)    Start/Stop times: 0936 - 1013 Start/Stop times:   - Stop Time:     Total time: 37 minutes  Total time:   minutes     This note to serve as a Discharge Summary if Evan [name] is discharged from the hospital or from therapy services.     Therapist:  Rana Snare, PT

## 2016-07-23 NOTE — Discharge Planning (AHS/AVS) (Signed)
Sent referral to [place] [facility] skilled nursing facilities per Discharge Planning RN.

## 2016-07-23 NOTE — Progress Notes (Signed)
-   Changed Ancef from 2 g IV q8h to 1 g IV q12h for CrCl range 11-34 ml/min  - Pharmacist will continue to monitor and adjust as needed based on renal function

## 2016-07-23 NOTE — Consults (Signed)
Diabetic Nurse Navigator      Noeh Langston [place] is a 71 y.o. 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  Chart reviewed for: Newish to insulin    Amed has history of Type 2 DM     Home Meds Include:   Lantus 10 units Qhs  Glimepiride 8 mg QD  Metformin 1000mg  BID    Objective   Current pertinent lab results: No results found for: Roselee Culver    Lab Results   Component Value Date    POCGLU 153 (H) 07/23/2016    POCGLU 191 (H) 07/22/2016    POCGLU 232 (H) 07/22/2016    POCGLU 173 (H) 07/22/2016    POCGLU 161 (H) 07/22/2016     Diet:  CCHo medium    Pt Interview:  I spoke with Mr. Farruggia today as he was sitting in  His chair. I provided him with a diabetes packet and discussed with him insulin pathophysiology, complications, my plate method of meal planning, site rotation, hypo & hyper glycemia signs & symptoms, treatment using rule of 15's and when to contact his MD. I discussed exercises role in blood sugar management and he has questions for his MD regarding what and how much he can exercise. I recommend walking or swimming when able to provide low impact aerobic exercise depending on what his MD says.    I discussed A1c and blood sugar goal, to include what his most recent A1c is and, sharps disposal.    He had a few concerns regarding his new insulin regimen from his PCP however at this time I deferred instruction on his insulin regimen and CBG testing until he is closer to D/C and I know what he will be going home on.     He states that he does not eat a lot of cho food. I discusses that he may want to keep a log of his foods for a while in addition to his blood sugars to determine which foods have the most  effect on his blood sugar, he is considering this.    I discussed the outpatient diabetes education offering and he does not appear\ especially interested.       Assessment:   Mr. Favia is not fully engaged with his blood sugar management and appears to prefer medications over diet and exercise lifestyle changes. He appears mildly interested in outpatient diabetes educations    Plan:   Pt. may benefit from:  Outpatient diabetes education  Referral to endocrinology    Recommend: Outpatient diabetes educator, Outpatient dietician and Endocrinology referral     Instructed  Patient on pathophysiology of diabetes, blood glucose monitoring, hypoglycemia, hyperglycemia, sick day management, physical activity, disposal of supplies, insulin storage, nutrition management, chronic complications and goal setting.    Reviewed with patient to follow with [facility] for continued education. and follow with Primary care Physician for medication adjustments.    Length of time spent with patient: 40 minutes.    I will place a referral for outpatient diabetes education and APP endocrinology referral for discharge.    Thank you.

## 2016-07-23 NOTE — Progress Notes (Signed)
Wound Care Note:  Reassess LLE and back and change dressing   Patient name: Evan [name] Room: 6477/6477-01 Time: 2:46 PM   Date of Birth: 11-Mar-1946  Date: 07/23/2016     Gender: male   MRN: [id number]   Attending: Berenice Primas, MD     Admitting Dx: Syncope and collapse [R55];Fever, unknown origin [R50.9];Weakness generalized [R53.1];Hyperglycemia [R73.9];Elevated troponin [R74.8];COPD exacerbation (CMS/HCC) [J44.1];Fever [R50.9];Cellulitis of left lower extremity [L03.116];Fall, initial encounter [W19.XXXA];Sepsis due to undetermined organism (CMS/HCC) [A41.9]            Allergies:   Allergies   Allergen Reactions   . Levofloxacin Rash     Maculopapular rash             Braden Scale:   Sensory Perceptions: Slightly limited  Moisture: Occasionally moist  Activity: Chairfast  Mobility: Slightly limited  Nutrition: Adequate  Friction and Shear: Potential problem  Braden Scale Score: 16  On appropriate surface? Yes, compella bed however, it continues to alarm every 10 mins.  I placed a call to [facility] and they will assess and fix it or get a new bed for the pt.    Labs:   Lab Results   Component Value Date    WHITEBLOODCE 18.6 (H) 07/23/2016    WHITEBLOODCE 17.6 (H) 07/22/2016    WHITEBLOODCE 10.4 07/15/2016    HGB 11.5 (L) 07/23/2016    HGB 10.7 (L) 07/22/2016    HGB 11.0 (L) 07/15/2016    HCT 34.8 (L) 07/23/2016    HCT 32.7 (L) 07/22/2016    HCT 32.6 (L) 07/15/2016    MCV 102.0 (H) 07/23/2016    MCV 101.5 (H) 07/22/2016    MCV 100.0 (H) 07/15/2016    LABPLAT 348 07/23/2016    LABPLAT 323 07/22/2016    LABPLAT 152 07/15/2016       No results for input(s): SEDRATE in the last 72 hours.  Recent Labs      07/23/16   0446   ALBUMIN  3.2*     Recent Labs      07/22/16   0505   CRP  2.30*     Body mass index is 52.16 kg/m?[name]  Nutritional Status:  low albumin, anemic and High BMI    Neuro Assessment: Pt alert and cooperative with care.    Fall Risk: Score: 60  Fall Risk: High  Precautions Observed        07/23/16  1300   Wounds and Other Ulcers 07/15/16  Leg Left;Anterior   Date First Assessed: 07/15/16   Wound Type: (c)   Location: Leg  Wound Location Orientation: Left;Anterior  Pre-existing: Yes   State of Healing Non-healing   Wound Assessment Pink;Epithelizalization;Slough;Friable   Shape (2-4 open areas scattered on anterior)   Edges / Margins attached   Peri-wound Assessment (Edematous, slight erythema,raised white tissue)   Drainage Amount Small   Drainage Description Serous   Closure None   Treatments Cleansed;Debrided: Mechanical   Dressing (Mepilex AG, Sorbex, Size G tubigrip)   Dressing Status Dressing Changed   Wounds and Other Ulcers 07/19/16  Leg Left;Posterior;Medial   Date First Assessed/Time First Assessed: 07/19/16 1347   Wound Type: (c)   Location: Leg  Wound Location Orientation: Left;Posterior;Medial  Pre-existing: No   State of Healing Healing   Wound Assessment Epithelizalization;Pink  (fibrin cap)   Edges / Margins attached   Peri-wound Assessment erythemic  (edematous)   Drainage Amount Scant   Drainage Description Serous   Closure None  Treatments Cleansed;Debrided: Mechanical   Dressing (Mepilex AG, Sorbex, Size G tubigrip)   Dressing Status Dressing Changed   Urine Characteristics   Urine Color Yellow/straw   Urine Appearance Clear   Urine Odor No odor                 Isolation Status:  standard precautions observed    Pain - Prior: 0  During: 0  After: 0  Denies wound pain  Family Present: other: Daughter and other male    Change in History: Pt now with rash all over body, kidney functioning    Last dressing change: 2/4    Debridement:   Mechanical: gauze was used to debride slough from anterior leg wound, about 40% of 9cm x 10cm area.     Treatment/Rationale/Impression:  Part of the open area debrided last week has begun to epithelialize however there are more new white raised tissue areas and much sloughy epidermis noted.  The erythema has lessened however the rash remains.  I coveed the  entire gaiter area with mepilex AG foam.  It will impart antimicrobial properties of silver as well as keeping the woundbed moist and wicking the drainage onto the Sorbex absorbent pads.  I discussed the case with Dr. Dreama Saa and he stated that CHF was not a concern and that mod double layer compression with Size G tubigrips would be beneficial.  L calf measures 52.5cm.  I only applied 1 layer today to buy pt compliance and to minimize the possible drainage amount.  Woundcare will return on 2/6 to apply the 2nd layer.  Staff will apply Aquacel AG, ABDs and stockingette when they perform the dressing changes.  Pt and family asked me to assess his back.  In the middle of the rash covered back, there were 3 light tan scbs.  2 flicked off easily to reveal a pinpoint red dot (no  Moisture) and intact skin, the 3rd one was firmly adherent.  I rubbed small drop of pt's hydrocortisone cream on this area ONLY and informed pt's floor nurse.  No dressing or other tx required at this time.  Pt also had a Mepilex border on his R elbow.  I removed that to reveal a fresh scar from an abrasion?  I Nothing open. I removed as much of the dried blood as I could with saline gauze and a ruler and then I covered the area with a sm Mepilex border bandage for protection of the fragile epidermis.    Lift Assist: 1 assist  Care Teamed with: Daughter      Education:    Who: patient, family and care provider      What reviewed: Wound Care , need to elevate   Pt understanding: May need reinforcements  Coordination of Care with: patient, nurse and family     Impression: See above.  Compression needed to stop edema and therefore, blister formation.    Recommendations:   Dressing changes to be done M-W-F by floor nurses.  Wound Care team to follow-up Tues 2/6 or Wed 2/7.      Billable Items: 1yd size G tubigrip, 2-6x6 Mepilex AG, 2 large oval Sorbex.

## 2016-07-23 NOTE — Progress Notes (Signed)
Skin/Tissue Integrity - Adult    . Incisions, wounds, or drain sites healing without S/S of infection Not Progressing        Wound care came up and saw pt today. Trying a little different dressing in order to see some healing.  Rash seems to be about the same no improvement. WBC still going up.  Pt started on new antibiotic and hydrocortisone cream used on skin twice today.   End of Shift Report     Significant Events See above  No BM today. BM on the 4th. Miralax given.      Relevant   Assessment Findings Generalized rash/redness       Patient/Family  Questions/Concerns One at this time.      DC Barriers Improvement in rash, infection signs, and wound care.

## 2016-07-23 NOTE — Progress Notes (Signed)
Daily Progress Note   Name: Evan [name]  DOB: 1946/01/29 71 y.o.  MRN: [id number]  CSN: [id number]  Evan [name]    Subjective:   Chief Complaint:  Cellulitis, acute renal failure, leg edema, drug rash  ?  Evan Stewartis a 71 y.o.?male?patient. Past medical history of morbid obesity, hypertension, sleep apnea on CPAP, chronic kidney disease stage III, type 2 diabetes mellitus on insulin. Presented with increasing weakness, fever and a syncopal episode. EMS called and he was hypoxic and was found to be septic with lactic acid of 4. He had acute kidney injury on presentation with creatinine up to 1.8 and elevated troponin at 0.11 and subsequently peaked at 7.7. He did not have chest pain. He complained of feeling weak with cough and shortness of breath. Stress test was moderate probability for underlying CAD. Cardiology recommended medical management unless he was having ongoing chest pain. Blood sugars were elevated and his Lantus was increased. Echocardiogram showed right heart strain but the patient has history of obstructive sleep apnea and obesity.. He had ultrasound of the lower extremities which did not show evidence of DVT. CT pulmonary angiogram showed no evidence of pulmonary emboli. Reportedly the patient developed contrast-induced nephropathy. His creatinine peaked at 5.1 nephrology consulted.     Itching due to rash responding to benadryl. No cp or sob. No fever or chills. No nausea or vomiting. Making urine  Objective:     Vitals Current 24 Hour Min / Max      Temp    36.6 ?C (97.9 ?F)    Temp  Min: 36.3 ?C (97.4 ?F)  Max: 36.7 ?C (98.1 ?F)      BP     132/64     BP  Min: 118/66  Max: 132/64      HR    71    Pulse  Min: 58  Max: 82      RR    18    Resp  Min: 17  Max: 19      Sats      SpO2: 95 % on  L/min       SpO2  Min: 95 %  Max: 98 %      Weight    (!) 180.3 kg (397 lb 7.8 oz)  Body mass index is 52.16 kg/m?[name]    Admit: (!) 181.4 kg (400 lb)   Intake/Ouput:    Intake/Output Summary  (Last 24 hours) at 07/23/16 0955  Last data filed at 07/23/16 3086   Gross per 24 hour   Intake             1340 ml   Output             2180 ml   Net             -840 ml      Drains output:     I/Os -ve 9.9 L  Exam:  Constitutional: NAD  Eyes:  PER  HENT:  supple   Respiratory:  No respiratory distress  Cardiovascular:  Normal rate, normal rhythm  GI:  Soft, nondistended, normal bowel sounds, nontender, no organomegaly, no mass, no rebound, no guarding   Musculoskeletal: 3+ b/l leg edema  Neurologic:  Alert & oriented x 3, no focal deficits noted   Psychiatric:  Speech and behavior appropriate   Skin: Cellulitis erythema of the left lower extremity. Other than that there is a diffuse rash including trunk and extremities and face appears to  be flattening      Scheduled Medications:  . aspirin  81 mg Oral Daily   . atorvastatin  20 mg Oral QPM   . heparin  5,000 Units Subcutaneous Q8H SCH   . hydrocortisone   Topical 2 times per day   . insulin aspart   Subcutaneous 4x Daily - AC and HS   . insulin aspart   Subcutaneous TID with meals   . insulin glargine  40 Units Subcutaneous Nightly   . metoprolol tartrate  150 mg Oral 2 times per day   . polyethylene glycol  17 g Oral Daily   . senna-docusate  2 tablet Oral 2 times per day   . sodium chloride 0.9 % (NS) syringe  5-10 mL Intravenous Q8H SCH     Infusions:   PRN Medications: acetaminophen, benzocaine-menthol, bisacodyl, dextrose, dextrose, dextrose, diphenhydrAMINE, diphenhydrAMINE, glucagon (human recombinant), HYDROcodone-acetaminophen, magnesium hydroxide, melatonin, nicotine polacrilex, phenol, polyvinyl alcohol, regadenoson, sodium chloride 0.9 % (NS) syringe, urea  Recent Labs:  Results for orders placed or performed during the hospital encounter of 07/11/16 (from the past 24 hour(s))   POCT Glucose -Next Routine    Collection Time: 07/22/16 10:34 AM   Result Value Ref Range    POC Glucose 173 (H) 80 - 99 mg/dL   POCT Glucose -Next Routine    Collection Time:  07/22/16  5:39 PM   Result Value Ref Range    POC Glucose 232 (H) 80 - 99 mg/dL   POCT Glucose -Next Routine    Collection Time: 07/22/16  8:52 PM   Result Value Ref Range    POC Glucose 191 (H) 80 - 99 mg/dL   Renal Function Panel -Daily    Collection Time: 07/23/16  4:46 AM   Result Value Ref Range    Albumin 3.2 (L) 3.5 - 5.0 g/dL    Calcium 8.9 8.6 - 16.1 mg/dL    Phosphorus 5.7 (H) 2.5 - 4.5 mg/dL    BUN 096 (H) 6 - 23 mg/dL    Creatinine 0.45 (H) 0.65 - 1.30 mg/dL    Sodium 409 811 - 914 mmol/L    Glucose 160 (H) 80 - 99 mg/dL    Potassium 4.6 3.5 - 5.1 mmol/L    Chloride 108 98 - 111 mmol/L    CO2 - Carbon Dioxide 19.6 (L) 21.0 - 31.0 mmol/L    Anion Gap 12.4 (H) 3.0 - 11.0 mmol/L    GFR Estimate 14 (L) >=60 mL/min/1.25m*2    GFR Additional Info     CBC with Auto Differential -AM Draw    Collection Time: 07/23/16  4:46 AM   Result Value Ref Range    WBC 18.6 (H) 4.8 - 10.8 10*3/?L    RBC 3.41 (L) 4.70 - 6.10 10*6/?L    Hemoglobin 11.5 (L) 12.0 - 18.0 g/dL    HCT 78.2 (L) 95.6 - 54.0 %    MCV 102.0 (H) 81.0 - 99.0 fL    MCH 33.7 27.0 - 34.0 pg    MCHC 33.0 32.0 - 36.0 g/dL    RDW 21.3 08.6 - 57.8 %    Platelet Count 348 150 - 400 10*3/?L    MPV 8.7 7.4 - 10.4 fL    Neutrophils % 83 (H) 35 - 70 %    Lymphocytes % 5 (L) 25 - 45 %    Monocytes % 3 0 - 12 %    Eosinophils % 8 (H) 0 - 7 %    Basophils %  1 0 - 2 %    Neutrophils, Absolute 15.4 (H) 1.6 - 7.3 10*3/?L    Lymphocytes, Absolute 0.9 (L) 1.1 - 4.3 10*3/?L    Monocytes, Absolute 0.6 0.0 - 1.2 10*3/?L    Eosinophils, Absolute 1.5 (H) 0.0 - 0.7 10*3/?L    Basophils, Absolute 0.2 0.0 - 0.2 10*3/?L    Differential Type Manual Differential     Toxic Granulation 1+ (A) (none)    Platelet Estimate Normal Normal    RBC Morphology Normal    Vancomycin, Random -AM Draw    Collection Time: 07/23/16  4:46 AM   Result Value Ref Range    Vancomycin, Timed 21.6 (H) 10.0 - 20.0 ?g/mL   POCT Glucose -Next Routine    Collection Time: 07/23/16  5:41 AM   Result Value Ref  Range    POC Glucose 153 (H) 80 - 99 mg/dL     Pending Labs     Order Current Status    Wound Culture, Superficial (Aerobic) -Next Routine Preliminary result        Recent Imaging:  No results found.      ASSESMENT:     - Severe sepsis on presentation. resolved  ?  - Left lower extremity cellulitis. Clinically looks better but slowly    - Leukocytosis: Initially improved trended up again since 1/28. Persistent cellulitis versus prednisone  ?  - Syncope. Could've been due to sepsis and hypotension. CT chest did not show evidence of PE. Echocardiogram showed evidence of right heart strain but the patient has underlying history of morbid obesity and obstructive sleep apnea.   ?  - Non-STEMI. Troponin peaked at 7.7. Stress test showed moderate likelihood for flow-limiting CAD . Dr. Marisue Humble discussed with cardiology and they recommended medical management unless the patient was having ongoing chest pain.   ?  - Acute kidney injury superimposed on chronic kidney disease stage III. Baseline creatinine 1.3 . Suspected secondary to contrast nephropathy. Nephrology managin. Creatinine peaked at 5.10 and improving.   ?  - Hyperkalemia. Improved  ?  - Type 2 diabetes mellitus on insulin with hyperglycemia. Metformin and Amaryl on hold. Glucose better controlled now after discontinuation of steroids  ?  - Edema. Most likely volume overload with acute kidney injury and right heart failure  ?  - Maculopapular rash. Most likely drug rash suspected due to Levaquin  ?  - Morbid obesity  ?      PLAN:     - MRSA screening negative repeat wound culture negative, switch vancomycin to Ancef 2 g IV every 8 hours and follow clinically. Hopefully with decreasing edema will be better response  ?  - continue ASA , statin and beta blocker for medical management of CAD  ?  - Lasix 60 mg by mouth daily  ?  - Management of renal function/acid base status and lytes per nephrology  ?  - Regarding  drug rash I stopped prednisone to avoid further  hyperglycemia and treat with Benadryl symptomatically  ?  - Continue current insulin regimen    - Consult pharmacy for renal dosing of medications  ?  - Continue home CPAP.   ?  - Wound care. Leg compression  ?  - Physical therapy  ?  DVT prophylaxis: Heparin subcutaneous     LOS: 11 days  Days  Total time spent: 29 min  Electronically signed:Desma Wilkowski Abdulhadi2/5/20189:55 AM  This dictation was produced using voice recognition software. Despite concurrent proofreading, please note transcription errors may  be present and that these errors may not truly reflect my intent.

## 2016-07-23 NOTE — Progress Notes (Signed)
Inpatient Nephrology Progress Note     Name: Evan [name]  DOB: 17-May-1946 71 y.o.  MRN: [id number]  CSN: [id number]  PCP: Marissa Nestle    Subjective:   71 year old gentleman with history of hypertension, COPD, diabetes, who presented with erythema and tenderness of his left lower extremity. He was also having shortness of breath and had a syncopal episode prior to admission. He was seen by EMS and found to be hypoxic. His initial labs showed Creatinine of 1.8 which is higher than baseline and lactic acidosis. He received contrast on January 26 and his creatinine increased to 3.7 the next day and continued to increase.  He also had positive blood cultures on January 25. Troponin of 0.1 on admission which peaked at 7.7 before trending down. He had no chest pain during that time. Ultrasound of his legs showed no DVT. CTA showed no pulmonary embolus.    Today states that he feels okay other than itching.  Has been OOB, ate all his breakfast, denies SOB.  Walked 60 ft with PT.  No difficulty voiding, no other c/o.      Objective:   Vital Signs:    Temp: 36.6 ?C (97.9 ?F) BP: 132/64 Pulse: 71 Resp: 18 SpO2: 95 % on O2 Flow Rate (L/min): 2 L/min None (Room air)   Min/Max Temp past 24 hours:Temp  Avg: 36.6 ?C (97.8 ?F)  Min: 36.3 ?C (97.4 ?F)  Max: 36.7 ?C (98.1 ?F)  Weight most recent: Weight: (!) 180.3 kg (397 lb 7.8 oz)     INTAKE/OUTPUT    Intake/Output Summary (Last 24 hours) at 07/23/16 1005  Last data filed at 07/23/16 0711   Gross per 24 hour   Intake             1340 ml   Output             2180 ml   Net             -840 ml     SUGARS:   POC Glucose   Date/Time Value Ref Range Status   07/23/2016 05:41 AM 153 (H) 80 - 99 mg/dL Final   56/21/3086 57:84 PM 191 (H) 80 - 99 mg/dL Final   69/62/9528 41:32 PM 232 (H) 80 - 99 mg/dL Final   44/06/270 53:66 AM 173 (H) 80 - 99 mg/dL Final   44/08/4740 59:56 AM 161 (H) 80 - 99 mg/dL Final       . aspirin  81 mg Oral Daily   . atorvastatin  20 mg Oral QPM   .  heparin  5,000 Units Subcutaneous Q8H SCH   . hydrocortisone   Topical 2 times per day   . insulin aspart   Subcutaneous 4x Daily - AC and HS   . insulin aspart   Subcutaneous TID with meals   . insulin glargine  40 Units Subcutaneous Nightly   . metoprolol tartrate  150 mg Oral 2 times per day   . polyethylene glycol  17 g Oral Daily   . senna-docusate  2 tablet Oral 2 times per day   . sodium chloride 0.9 % (NS) syringe  5-10 mL Intravenous Q8H [facility]         Physical Exam:  Alert and oriented  Lungs clear bilaterally  Regular rate and rhythm  Abdomen nontender, nondistended  Musculoskeletal: 2+ edema bilaterally  Skin: Erythema on the left lower leg, trunk, and head    Recent Labs:  Results for orders  placed or performed during the hospital encounter of 07/11/16 (from the past 24 hour(s))   POCT Glucose -Next Routine    Collection Time: 07/22/16 10:34 AM   Result Value Ref Range    POC Glucose 173 (H) 80 - 99 mg/dL   POCT Glucose -Next Routine    Collection Time: 07/22/16  5:39 PM   Result Value Ref Range    POC Glucose 232 (H) 80 - 99 mg/dL   POCT Glucose -Next Routine    Collection Time: 07/22/16  8:52 PM   Result Value Ref Range    POC Glucose 191 (H) 80 - 99 mg/dL   Renal Function Panel -Daily    Collection Time: 07/23/16  4:46 AM   Result Value Ref Range    Albumin 3.2 (L) 3.5 - 5.0 g/dL    Calcium 8.9 8.6 - 16.1 mg/dL    Phosphorus 5.7 (H) 2.5 - 4.5 mg/dL    BUN 096 (H) 6 - 23 mg/dL    Creatinine 0.45 (H) 0.65 - 1.30 mg/dL    Sodium 409 811 - 914 mmol/L    Glucose 160 (H) 80 - 99 mg/dL    Potassium 4.6 3.5 - 5.1 mmol/L    Chloride 108 98 - 111 mmol/L    CO2 - Carbon Dioxide 19.6 (L) 21.0 - 31.0 mmol/L    Anion Gap 12.4 (H) 3.0 - 11.0 mmol/L    GFR Estimate 14 (L) >=60 mL/min/1.39m*2    GFR Additional Info     CBC with Auto Differential -AM Draw    Collection Time: 07/23/16  4:46 AM   Result Value Ref Range    WBC 18.6 (H) 4.8 - 10.8 10*3/?L    RBC 3.41 (L) 4.70 - 6.10 10*6/?L    Hemoglobin 11.5 (L) 12.0 - 18.0  g/dL    HCT 78.2 (L) 95.6 - 54.0 %    MCV 102.0 (H) 81.0 - 99.0 fL    MCH 33.7 27.0 - 34.0 pg    MCHC 33.0 32.0 - 36.0 g/dL    RDW 21.3 08.6 - 57.8 %    Platelet Count 348 150 - 400 10*3/?L    MPV 8.7 7.4 - 10.4 fL    Neutrophils % 83 (H) 35 - 70 %    Lymphocytes % 5 (L) 25 - 45 %    Monocytes % 3 0 - 12 %    Eosinophils % 8 (H) 0 - 7 %    Basophils % 1 0 - 2 %    Neutrophils, Absolute 15.4 (H) 1.6 - 7.3 10*3/?L    Lymphocytes, Absolute 0.9 (L) 1.1 - 4.3 10*3/?L    Monocytes, Absolute 0.6 0.0 - 1.2 10*3/?L    Eosinophils, Absolute 1.5 (H) 0.0 - 0.7 10*3/?L    Basophils, Absolute 0.2 0.0 - 0.2 10*3/?L    Differential Type Manual Differential     Toxic Granulation 1+ (A) (none)    Platelet Estimate Normal Normal    RBC Morphology Normal    Vancomycin, Random -AM Draw    Collection Time: 07/23/16  4:46 AM   Result Value Ref Range    Vancomycin, Timed 21.6 (H) 10.0 - 20.0 ?g/mL   POCT Glucose -Next Routine    Collection Time: 07/23/16  5:41 AM   Result Value Ref Range    POC Glucose 153 (H) 80 - 99 mg/dL       Recent Imaging:  No results found.    Assessment and plan:     71 year old  with history of hypertension, COPD, diabetes, who presented with erythema and tenderness of his left lower extremity.  ?  Acute kidney injury:  Most likely represents contrast nephropathy secondary to contrast exposure1/26.  Possibly now developing a component of AIN associated with drug rash, although I think that contrast is sufficient explanation. Baseline creatinine is around 1.5, was 1.8 on admission, peaked at 5.1.  Today improved to 4.3, UOP remains excellent.  Continue supportive care.    Metabolic acidosis: mild, plausibly related to renal failure.    ?  ===Per hospitalists:   -diffuse rash, possible drug reaction  -Diastolic congestive heart failure, echo indicating RV overload. Moderate probability noninvasive stress test  -DM2, erratic glucose control      Plan of care was discussed with patient.      [place]arr [place]  Corran Lalone  [facility]  07/23/2016

## 2016-07-23 NOTE — Progress Notes (Signed)
Nutrition Follow UP    Evan [name] is a 71 y.o. male, DOB 02-24-1946 admitted with Sepsis, Fever, cellulitis, h/o Diabetes Type 2 with CKD stage 3, and morbid obesity.      Nutrition screening and assessment has been completed and we do not have any recommendations at this time. If the dietitian or other nutrition services staff might be of assistance with the nutritional care of this patient, it would be our pleasure.     Per nursing admit screen no weight loss or loss of appetite noted PTA.      Body mass index is 52.16 kg/m?[name]  Ht/wt status:morbid obesity   Has 2-3+ pitting edema to BLE.     Diet Orders:   Procedures   . Diet, Diabetic Diabetic, Consistent Carbohydrate Medium; Sodium 3 GM; Heart Healthy; Yes - patient to order food   CBG's: 185-274 mg/dL.  Current diet appropriate.  Diabetes educator saw pt 07/23/16.      P.O. Intake: continues to have good appetite and eating 100% of meals.    Meeting PO goal of consume > 70% of meals.    Will follow per protocol.  [place], [place]  07/23/2016 3:04 PM  [facility]

## 2016-07-24 LAB — CBC WITH AUTO DIFFERENTIAL
Basophils %: 0 % (ref 0–2)
Basophils, Absolute: 0 10*3/ÂµL (ref 0.0–0.2)
Eosinophils %: 4 % (ref 0–7)
Eosinophils, Absolute: 0.7 10*3/ÂµL (ref 0.0–0.7)
HCT: 33 % — ABNORMAL LOW (ref 42.0–54.0)
Hemoglobin: 10.9 g/dL — ABNORMAL LOW (ref 12.0–18.0)
Lymphocytes %: 10 % — ABNORMAL LOW (ref 25–45)
Lymphocytes, Absolute: 1.7 10*3/ÂµL (ref 1.1–4.3)
MCH: 33.3 pg (ref 27.0–34.0)
MCHC: 32.9 g/dL (ref 32.0–36.0)
MCV: 101 fL — ABNORMAL HIGH (ref 81.0–99.0)
MPV: 8.9 fL (ref 7.4–10.4)
Monocytes %: 8 % (ref 0–12)
Monocytes, Absolute: 1.3 10*3/ÂµL — ABNORMAL HIGH (ref 0.0–1.2)
Neutrophils %: 78 % — ABNORMAL HIGH (ref 35–70)
Neutrophils, Absolute: 12.9 10*3/??L — ABNORMAL HIGH (ref 1.6–7.3)
Platelet Count: 337 10*3/ÂµL (ref 150–400)
Platelet Estimate: NORMAL
RBC: 3.27 10*6/ÂµL — ABNORMAL LOW (ref 4.70–6.10)
RDW: 14.3 % (ref 11.5–14.5)
WBC: 16.5 10*3/ÂµL — ABNORMAL HIGH (ref 4.8–10.8)

## 2016-07-24 LAB — POCT GLUCOSE
POC Glucose: 133 mg/dL — ABNORMAL HIGH (ref 80–99)
POC Glucose: 171 mg/dL — ABNORMAL HIGH (ref 80–99)
POC Glucose: 191 mg/dL — ABNORMAL HIGH (ref 80–99)
POC Glucose: 210 mg/dL — ABNORMAL HIGH (ref 80–99)

## 2016-07-24 LAB — RENAL FUNCTION PANEL
Albumin: 3.2 g/dL — ABNORMAL LOW (ref 3.5–5.0)
Anion Gap: 11.5 mmol/L — ABNORMAL HIGH (ref 3.0–11.0)
BUN: 107 mg/dL — ABNORMAL HIGH (ref 6–23)
CO2 - Carbon Dioxide: 20.5 mmol/L — ABNORMAL LOW (ref 21.0–31.0)
Calcium: 9.1 mg/dL (ref 8.6–10.3)
Chloride: 108 mmol/L (ref 98–111)
Creatinine: 3.87 mg/dL — ABNORMAL HIGH (ref 0.65–1.30)
GFR Estimate: 15 mL/min/{1.73_m2} — ABNORMAL LOW (ref >=60)
Glucose: 148 mg/dL — ABNORMAL HIGH (ref 80–99)
Phosphorus: 6.1 mg/dL — ABNORMAL HIGH (ref 2.5–4.5)
Potassium: 4.5 mmol/L (ref 3.5–5.1)
Sodium: 140 mmol/L (ref 135–143)

## 2016-07-24 MED ORDER — predniSONE (DELTASONE) tablet 20 mg
20 | Freq: Two times a day (BID) | ORAL | Status: AC
Start: 2016-07-24 — End: 2016-07-28
  Administered 2016-07-24 – 2016-07-29 (×10): 20 mg via ORAL

## 2016-07-24 MED FILL — LANTUS U-100 INSULIN 100 UNIT/ML SUBCUTANEOUS SOLUTION: 100 [IU]/mL | SUBCUTANEOUS | Qty: 0.4

## 2016-07-24 MED FILL — CEFAZOLIN 1 GRAM/50 ML IN DEXTROSE (ISO-OSMOTIC) INTRAVENOUS PIGGYBACK: 1 gram/50 mL | INTRAVENOUS | Qty: 50

## 2016-07-24 MED FILL — ASPIRIN 81 MG TABLET,ENTERIC COATED: 81 mg | ORAL | Qty: 1

## 2016-07-24 MED FILL — HEPARIN, PORCINE (PF) 5,000 UNIT/0.5 ML SYRINGE: 5000 unit/0.5 mL | INTRAMUSCULAR | Qty: 0.5

## 2016-07-24 MED FILL — METOPROLOL TARTRATE 50 MG TABLET: 50 mg | ORAL | Qty: 3

## 2016-07-24 MED FILL — PREDNISONE 20 MG TABLET: 20 mg | ORAL | Qty: 1

## 2016-07-24 MED FILL — SENNA PLUS 8.6 MG-50 MG TABLET: 8.6-50 mg | ORAL | Qty: 2

## 2016-07-24 MED FILL — FUROSEMIDE 20 MG TABLET: 20 mg | ORAL | Qty: 1

## 2016-07-24 MED FILL — ATORVASTATIN 20 MG TABLET: 20 mg | ORAL | Qty: 1

## 2016-07-24 MED FILL — DIPHENHYDRAMINE 25 MG CAPSULE: 25 mg | ORAL | Qty: 1

## 2016-07-24 MED FILL — MIRALAX 17 GRAM ORAL POWDER PACKET: 17 g | ORAL | Qty: 1

## 2016-07-24 MED FILL — FUROSEMIDE 40 MG TABLET: 40 mg | ORAL | Qty: 1

## 2016-07-24 NOTE — Progress Notes (Signed)
End of Shift Report     Significant Events IV went bad during infusion. Infusion paused, IV leaking. New IV placed.      Relevant   Assessment Findings Rash covering entire body. Diminished lung sounds bilaterally. +2 to +3 edema lower extremities bilaterally. Wound covered with dressing. Clean dry and intact.       Patient/Family  Questions/Concerns None stated from pt     DC Barriers    General Medicine Patients       Is the patient voiding or having normal catheter output? Yes      Is the patient eating/drinking adequately? Yes      Last bowel movement?   2/4      What is the stability of patient?  VSS      How is pain managed and controlled? (last dose) No pain meds wanted       Calls made to patient care provider   N/A      New orders related to changes in patient condition?  N/A       Miscellaneous information from the shift N/A

## 2016-07-24 NOTE — Treatment Summary (Signed)
IP PT TREATMENT NOTE     Patient:  Evan [name] / [id number] DOB:  Oct 25, 1945 / 71 y.o. Date:  07/24/2016     Precautions  Specific mobility precautions: None  Other: Fall     ASSESSMENT     Summary:  Pt displayed 'shakiness' in all limbs and trunk with initial gait activities today  He was sleeping soundly when I entered the room but also commented 'I wonder if it could be medication because I just feel shaky all over'  Standing rest breaks required with walking, but no LOB, SOB or dizziness  Improving activity tolerance and pt is very cooperative for PT  He will benefit from interim placement and ongoing low intensity PT to assist him in regaining strength and activity tolerance, prior to return to home     Able to be up with Nursing Team (x 1).     Activity tolerance: Tolerates 30 min activity with multiple rests  Comments: Requires standing rest breaks     Precaution Awareness: No specific precautions  Deficit Awareness: Fully aware of deficits  Correction of Errors: Self-corrects with cues  Safety Judgment: Good awareness of safety     Consult recommendations  Pt. would benefit from Discharge Planner consult.   Discharge recommendations  Mobility Equipment needs: Front wheeled walker (FWW)   Primary recommendation : Patient would benefit from SNF/Nursing Home/Other Skilled Facility.  Recommended transport method: Wheelchair  Secondary recommendation: Patient would benefit from Home Health PT.  Recommended transport method: Wheelchair  Justification: Progressive gait, balance, strengthening and mobility activities for safety and independence with retun to home      PLAN     Treatment Plan Frequency    Plan: Continue per established POC.  Pt. demonstrates limitations which require skilled PT intervention.  1x (5 days/week).  Frequency will be increased as pt's condition or discharge plan indicates.     Evan [name] will remain on the Physical Therapy schedule until goals are met, or there has been a  change in the Plan of Care.          Treatment Diagnosis Surgery / # Days Post-op (if applicable)    Primary Dx: Sepsis, cellulitis  Treatment Dx: Impaired mobility   /         SUBJECTIVE   Reviewed chart and nursing approved PT at this time. Pt sleeping soundly, awoke to his name, had difficulty waking up stating 'I didn't get much sleep last night, but he was agreeable to PT     Pain Level  Current status: Pain does not limit pt's ability to participate in therapy  Pain at start of session: 0 - No Pain  Pain at end of session: 0 - No Pain     OBJECTIVE   LE exercises, balance, mobility, transfer and gait activities (See below)     Mental status  General Demeanor: Pleasant;Cooperative  LOC: Alert  Orientation: Oriented x 4  --> Follows multi-step commands: Consistently  Attention span: Appears intact  Memory: Appears intact in social/therapy situations   Medical appliances   None     TREATMENT  Bed Mobility/Transfers  Bed mobility       Rolling / logroll  Rolling R: SBA      Transition to sitting up Sidelying to Sit: SBA   To resting position      Transition to sitting down SBA     Transition to supine       Scooting     Transfers  Transition to standing From bed: SBA     Method Stand pivot: SBA      Gait/Stairs  Mobility - Gait (Level)  Gait (distance): 85 feet (With 3 standing rest breaks)  Gait (assist level): CGA  Assistive device: Front wheeled walker (FWW)  Endurance: Fair  Pace: Slow  Pattern: Wide BOS;Downward gaze;Guarded;Increased upper body tension;Shuffling;Decreased foot clearance L;Decreased foot clearance R;Decreased step length L;Decreased step length R  Surface: Level tile    Ther. Ex.  Ankle pumps: 10  Heelslides: 10  Quad sets: 5  Gluteal sets: 5  Seated long arc quads: 10  Addit. Exercises: Seated marching, stepping in place    Balance   Shuffling gait, but no LOB   Training/Education  Trainee(s): Primary Learner  Primary Learner: Patient  Primary Learner - Barriers to Learning?:  Yes  Primary Learner - Specific barriers: Physical  Training provided: FWW gait and remaining within frame of walker  Response to training: Receptive   Safety/Room Set-up   Call button accessible?: Yes  Phone accessible?: Yes  Oxygen reconnected?: No, on room air  Patient mobility in room: Able to be up with Nursing Team (x 1).  Position on arrival: In bed  Position on departure (upright): In recliner chair;Chair alarm on  Comments: Nursing aware     GOALS     Patient/Caregiver goals reviewed and integrated with rehab treatment plan:      Multidisciplinary Problems (Active)        Problem: IP General Goal List - 2    Goal Priority Disciplines Outcome   Bed Mobility     PT    Description:  Pt. to perform bed mobility with min assist.    Transfers     PT    Description:  Pt. to perform all functional transfers with min. assist.    Ambulation - Levels     PT    Description:  Pt. to ambulate with min assist x 50 feet with appropriate assistive device.    Ambulation - Stairs     PT    Description:  Pt. to ascend/descend stairs with min assist, as per discharge environment.    Home Exercise Program     PT    Description:  Pt. to perform basic HEP with min assist.    Caregiver Training     PT    Description:  Patient/caregiver training will be provided, as needed to achieve goals.             Problem: Patient/Family Goal    Goal Priority Disciplines Outcome   Normal Status     PT    Description:  Pt. wants to return to previous level of function.     Home     PT    Description:  Pt. wants to return to previous living situation.    Mobility     PT    Description:  Patient wants to walk again.                          First session Second session (if applicable)    Start/Stop times: 1100 - 1140 Start/Stop times:   - Stop Time:     Total time: 40 minutes  Total time:   minutes     This note to serve as a Discharge Summary if Evan [name] is discharged from the hospital or from therapy services.     Therapist:  Rana Snare, PT

## 2016-07-24 NOTE — Progress Notes (Signed)
07/24/16 0845   Wounds and Other Ulcers 07/15/16  Leg Left;Anterior   Date First Assessed: 07/15/16   Wound Type: (c)   Location: Leg  Wound Location Orientation: Left;Anterior  Pre-existing: Yes   State of Healing (UTA, dressing in place)   Drainage Amount Small   Drainage Description Serous   Dressing (Left dressing placed 2/5 in place,placed 2nd size G tubigrip)   Dressing Status Clean, Dry and Intact  (Only added 2nd layer of size G tubigrip to LLE)   Wounds and Other Ulcers 07/19/16  Leg Left;Posterior;Medial   Date First Assessed/Time First Assessed: 07/19/16 1347   Wound Type: (c)   Location: Leg  Wound Location Orientation: Left;Posterior;Medial  Pre-existing: No   State of Healing (Unable to assess, dressing in place)   Drainage Amount Small   Drainage Description Serous   Dressing (Only added 2nd layer of size G tubigrip to LLE)   Dressing Status Clean, Dry and Intact  (Only added 2nd layer of size G tubigrip to LLE)     Woundcare returned to apply 2nd layer of Size G tubigrip.  Dressings dry and intact.    Supplies:  1yd size G tubigrip  Fall and standard precautions observed.  Careteamed with no one.

## 2016-07-24 NOTE — Progress Notes (Signed)
07/24/16 0745 07/24/16 0747 07/24/16 0750   Vital Signs   Temp 37.2 ?C (98.9 ?F) --  --    Temp src Temporal --  --    Pulse 62 66 74   Heart Rate Source Monitor --  --    Resp 18 19 20    BP (!) 100/46 121/81 116/67   EPIC Calculated MAP (mmHg) 64 mmHg 94 mmHg 83 mmHg   BP Location Left arm Left arm Left arm   BP Method Non-Invasive Automatic Non-Invasive Automatic Non-Invasive Automatic   Patient Position Lying Sitting Standing   Oxygen Therapy   SpO2 94 % (!) 84 % 97 %

## 2016-07-24 NOTE — Progress Notes (Signed)
Daily Progress Note   Name: Evan [name]  DOB: 1945-10-02 71 y.o.  MRN: [id number]  CSN: [id number]  NGE:[id number] [name]    Subjective:   Chief Complaint:  Cellulitis, acute renal failure, leg edema, drug rash  ?  Evan [name]?is a 71 y.o.?male?patient. Past medical history of morbid obesity, hypertension, sleep apnea on CPAP, chronic kidney disease stage III, type 2 diabetes mellitus on insulin. Presented with increasing weakness, fever and a syncopal episode. EMS called and he was hypoxic and was found to be septic with lactic acid of 4. He had acute kidney injury on presentation with creatinine up to 1.8 and elevated troponin at 0.11 and subsequently peaked at 7.7. He did not have chest pain. He complained of feeling weak with cough and shortness of breath. Stress test was moderate probability for underlying CAD. Cardiology recommended medical management unless he was having ongoing chest pain. Blood sugars were elevated and his Lantus was increased. Echocardiogram showed right heart strain but the patient has history of obstructive sleep apnea and obesity.. He had ultrasound of the lower extremities which did not show evidence of DVT. CT pulmonary angiogram showed no evidence of pulmonary emboli. Reportedly the patient developed contrast-induced nephropathy. His creatinine peaked at 5.1 nephrology consulted.     Main complaint is itching from rash. No fever. No chills. No nausea or vomiting urinating good amount. No diarrhea.    Objective:     Vitals Current 24 Hour Min / Max      Temp    37.2 ?C (98.9 ?F)    Temp  Min: 36.6 ?C (97.8 ?F)  Max: 37.2 ?C (98.9 ?F)      BP     116/67     BP  Min: 100/46  Max: 134/62      HR    74    Pulse  Min: 62  Max: 74      RR    20    Resp  Min: 18  Max: 20      Sats      SpO2: 97 % on  L/min       SpO2  Min: 84 %  Max: 99 %      Weight    (!) 180.3 kg (397 lb 7.8 oz)  Body mass index is 52.16 kg/m?[name]    Admit: (!) 181.4 kg (400 lb)   Intake/Ouput:    Intake/Output  Summary (Last 24 hours) at 07/24/16 0840  Last data filed at 07/24/16 3244   Gross per 24 hour   Intake              360 ml   Output             3730 ml   Net            -3370 ml      Drains output:       Exam:  Constitutional: No acute distress  Eyes:  PER, conjunctiva normal   HENT:   supple   Respiratory:  No respiratory distress, normal breath sounds, no rales, no wheezing   Cardiovascular:  Normal rate, normal rhythm, no murmurs, no gallops, no rubs   GI:  Soft, nondistended, normal bowel sounds, nontender  Musculoskeletal:    Neurologic:  Alert & oriented x 3, no focal deficit  Psychiatric:  Speech and behavior appropriate   Skin: Left lower extremity compressive stocking followed by wound care. Diffuse skin rash sparing the mucosal and palms. Involving  trunk and extremities although there are several targetoid lesions however mostly maculopapular      Scheduled Medications:  . aspirin  81 mg Oral Daily   . atorvastatin  20 mg Oral QPM   . ceFAZolin (ANCEF) IV  1 g Intravenous Q12H   . furosemide  60 mg Oral Daily   . heparin  5,000 Units Subcutaneous Q8H SCH   . hydrocortisone   Topical 2 times per day   . insulin aspart   Subcutaneous 4x Daily - AC and HS   . insulin aspart   Subcutaneous TID with meals   . insulin glargine  40 Units Subcutaneous Nightly   . metoprolol tartrate  150 mg Oral 2 times per day   . polyethylene glycol  17 g Oral Daily   . senna-docusate  2 tablet Oral 2 times per day   . sodium chloride 0.9 % (NS) syringe  5-10 mL Intravenous Q8H SCH     Infusions:   PRN Medications: acetaminophen, benzocaine-menthol, bisacodyl, dextrose, dextrose, dextrose, diphenhydrAMINE, diphenhydrAMINE, glucagon (human recombinant), HYDROcodone-acetaminophen, magnesium hydroxide, melatonin, nicotine polacrilex, phenol, polyvinyl alcohol, regadenoson, sodium chloride 0.9 % (NS) syringe, urea  Recent Labs:  Results for orders placed or performed during the hospital encounter of 07/11/16 (from the past 24  hour(s))   POCT Glucose -Next Routine    Collection Time: 07/23/16 11:36 AM   Result Value Ref Range    POC Glucose 192 (H) 80 - 99 mg/dL   POCT Glucose -Next Routine    Collection Time: 07/23/16  4:26 PM   Result Value Ref Range    POC Glucose 171 (H) 80 - 99 mg/dL   POCT Glucose -Next Routine    Collection Time: 07/23/16  9:46 PM   Result Value Ref Range    POC Glucose 210 (H) 80 - 99 mg/dL   POCT Glucose -Next Routine    Collection Time: 07/24/16  5:28 AM   Result Value Ref Range    POC Glucose 133 (H) 80 - 99 mg/dL   Renal Function Panel -Daily    Collection Time: 07/24/16  5:42 AM   Result Value Ref Range    Albumin 3.2 (L) 3.5 - 5.0 g/dL    Calcium 9.1 8.6 - 09.8 mg/dL    Phosphorus 6.1 (H) 2.5 - 4.5 mg/dL    BUN 119 (H) 6 - 23 mg/dL    Creatinine 1.47 (H) 0.65 - 1.30 mg/dL    Sodium 829 562 - 130 mmol/L    Glucose 148 (H) 80 - 99 mg/dL    Potassium 4.5 3.5 - 5.1 mmol/L    Chloride 108 98 - 111 mmol/L    CO2 - Carbon Dioxide 20.5 (L) 21.0 - 31.0 mmol/L    Anion Gap 11.5 (H) 3.0 - 11.0 mmol/L    GFR Estimate 15 (L) >=60 mL/min/1.61m*2    GFR Additional Info     CBC with Auto Differential -AM Draw    Collection Time: 07/24/16  5:42 AM   Result Value Ref Range    WBC 16.5 (H) 4.8 - 10.8 10*3/?L    RBC 3.27 (L) 4.70 - 6.10 10*6/?L    Hemoglobin 10.9 (L) 12.0 - 18.0 g/dL    HCT 86.5 (L) 78.4 - 54.0 %    MCV 101.0 (H) 81.0 - 99.0 fL    MCH 33.3 27.0 - 34.0 pg    MCHC 32.9 32.0 - 36.0 g/dL    RDW 69.6 29.5 - 28.4 %    Platelet  Count 337 150 - 400 10*3/?L    MPV 8.9 7.4 - 10.4 fL    Neutrophils % 78 (H) 35 - 70 %    Lymphocytes % 10 (L) 25 - 45 %    Monocytes % 8 0 - 12 %    Eosinophils % 4 0 - 7 %    Basophils % 0 0 - 2 %    Neutrophils, Absolute 12.9 (H) 1.6 - 7.3 10*3/?L    Lymphocytes, Absolute 1.7 1.1 - 4.3 10*3/?L    Monocytes, Absolute 1.3 (H) 0.0 - 1.2 10*3/?L    Eosinophils, Absolute 0.7 0.0 - 0.7 10*3/?L    Basophils, Absolute 0.0 0.0 - 0.2 10*3/?L    Differential Type Manual Differential     Platelet  Estimate Normal Normal    Anisocytosis 1+ (A) (none)    Macrocytes 1+ (A) (none)     Pending Labs     Order Current Status    Wound Culture, Superficial (Aerobic) -Next Routine Preliminary result        Recent Imaging:  No results found.      ASSESMENT:     - Severe sepsis on presentation. resolved  ?  - Left lower extremity cellulitis. Clinically looks better but slowly  ?  - Leukocytosis: still elevated but likely due to steroids.   ?  - Syncope. Could've been due to sepsis and hypotension. CT chest did not show evidence of PE. Echocardiogram showed evidence of right heart strain but the patient has underlying history of morbid obesity and obstructive sleep apnea.   ?  - Non-STEMI. Troponin peaked at 7.7. Stress test showed moderate likelihood for flow-limiting CAD . Dr. Marisue Humble discussed with cardiology and they recommended medical management unless the patient was having ongoing chest pain. Believe this is the best approach given his renal function.   ?  - Acute kidney injury superimposed on chronic kidney disease stage III. Baseline creatinine 1.3 . Suspected secondary to contrast nephropathy. Nephrology signed off. Creatinine peaked at 5.10 and gradually improving.   ?  - Hyperkalemia. Improved  ?  - Type 2 diabetes mellitus on insulin with hyperglycemia. Metformin and Amaryl on hold. Glucose better controlled   ?  - Edema. Most likely volume overload with acute kidney injury and right heart failure  ?  - Diffuse Maculopapular rash. Most likely drug rash suspected due to Levaquin. Doubt erythema multiforme with sparing of the palms and mucosa  ?  - Morbid obesity    - HTN. Lisinopril and HCTZ on hold due to acute kidney injury    PLAN:   - Restart prednisone for drug rash    - Continue Ancef  ?  - continue ASA , statin,  and beta blocker for medical management of CAD  ?  - Lasix 60 mg by mouth daily  ?  - monitor kidney function and avoid nephrotoxic medications. I/Os  ?  - Regarding ?drug rash I  stopped?prednisone to avoid further hyperglycemia and treat with Benadryl symptomatically  ?  - Continue current insulin regimen  ?  - pharmacy for renal dosing of medications  ?  - CPAP.   ?  - Wound care  ?  - Physical therapy  ?  DVT prophylaxis: Heparin subcutaneous     LOS: 12 days  Days  Total time spent: 28 minutes.   Electronically signed:Audrinna Sherman Abdulhadi2/6/20188:40 AM  This dictation was produced using voice recognition software. Despite concurrent proofreading, please note transcription errors may be present and  that these errors may not truly reflect my intent.

## 2016-07-24 NOTE — Progress Notes (Signed)
End of Shift Report     Significant Events Wound care done today and they will be back tomorrow.  Up in chair for all meals     Relevant   Assessment Findings Redness across body from antibiotic reaction.  Continues to have itchiness; benadryl given.  +2 pitting edema to bilateral lower extremities.  Tele: NSR/SB 50's-60's      Patient/Family  Questions/Concerns      DC Barriers IV antibiotic administration   General Medicine Patients       Is the patient voiding or having normal catheter output? yes      Is the patient eating/drinking adequately? yes      Last bowel movement?   2/6      What is the stability of patient?  VSS, MEWS 1      How is pain managed and controlled? (last dose) No pain medication requested       Calls made to patient care provider   none      New orders related to changes in patient condition?  prednisone started today for allergic reaction.       Miscellaneous information from the shift none

## 2016-07-25 LAB — POCT GLUCOSE
POC Glucose: 227 mg/dL — ABNORMAL HIGH (ref 80–99)
POC Glucose: 256 mg/dL — ABNORMAL HIGH (ref 80–99)
POC Glucose: 235 mg/dL — ABNORMAL HIGH (ref 80–99)
POC Glucose: 257 mg/dL — ABNORMAL HIGH (ref 80–99)

## 2016-07-25 LAB — CBC WITH AUTO DIFFERENTIAL
Basophils %: 0 % (ref 0–2)
Basophils, Absolute: 0 10*3/??L (ref 0.0–0.2)
Eosinophils %: 0 % (ref 0–7)
Eosinophils, Absolute: 0 10*3/ÂµL (ref 0.0–0.7)
HCT: 32.6 % — ABNORMAL LOW (ref 42.0–54.0)
Hemoglobin: 10.6 g/dL — ABNORMAL LOW (ref 12.0–18.0)
Lymphocytes %: 5 % — ABNORMAL LOW (ref 25–45)
Lymphocytes, Absolute: 1 10*3/ÂµL — ABNORMAL LOW (ref 1.1–4.3)
MCH: 32.9 pg (ref 27.0–34.0)
MCHC: 32.5 g/dL (ref 32.0–36.0)
MCV: 101.4 fL — ABNORMAL HIGH (ref 81.0–99.0)
MPV: 9.3 fL (ref 7.4–10.4)
Monocytes %: 0 % (ref 0–12)
Monocytes, Absolute: 0 10*3/ÂµL (ref 0.0–1.2)
Neutrophils %: 95 % — ABNORMAL HIGH (ref 35–70)
Neutrophils, Absolute: 19.4 10*3/ÂµL — ABNORMAL HIGH (ref 1.6–7.3)
Platelet Count: 337 10*3/ÂµL (ref 150–400)
Platelet Estimate: NORMAL
RBC Morphology: NORMAL
RBC: 3.22 10*6/ÂµL — ABNORMAL LOW (ref 4.70–6.10)
RDW: 14.1 % (ref 11.5–14.5)
WBC: 20.4 10*3/ÂµL — ABNORMAL HIGH (ref 4.8–10.8)

## 2016-07-25 LAB — BASIC METABOLIC PANEL
Anion Gap: 11.9 mmol/L — ABNORMAL HIGH (ref 3.0–11.0)
BUN: 97 mg/dL — ABNORMAL HIGH (ref 6–23)
CO2 - Carbon Dioxide: 17.1 mmol/L — ABNORMAL LOW (ref 21.0–31.0)
Calcium: 8.9 mg/dL (ref 8.6–10.3)
Chloride: 108 mmol/L (ref 98–111)
Creatinine: 3.44 mg/dL — ABNORMAL HIGH (ref 0.65–1.30)
GFR Estimate: 18 mL/min/{1.73_m2} — ABNORMAL LOW (ref >=60)
Glucose: 258 mg/dL — ABNORMAL HIGH (ref 80–99)
Potassium: 5 mmol/L (ref 3.5–5.1)
Sodium: 137 mmol/L (ref 135–143)

## 2016-07-25 MED FILL — SENNA PLUS 8.6 MG-50 MG TABLET: 8.6-50 mg | ORAL | Qty: 2

## 2016-07-25 MED FILL — PREDNISONE 20 MG TABLET: 20 mg | ORAL | Qty: 1

## 2016-07-25 MED FILL — CEFAZOLIN 1 GRAM/50 ML IN DEXTROSE (ISO-OSMOTIC) INTRAVENOUS PIGGYBACK: 1 gram/50 mL | INTRAVENOUS | Qty: 50

## 2016-07-25 MED FILL — HEPARIN, PORCINE (PF) 5,000 UNIT/0.5 ML SYRINGE: 5000 unit/0.5 mL | INTRAMUSCULAR | Qty: 0.5

## 2016-07-25 MED FILL — METOPROLOL TARTRATE 50 MG TABLET: 50 mg | ORAL | Qty: 3

## 2016-07-25 MED FILL — FUROSEMIDE 20 MG TABLET: 20 mg | ORAL | Qty: 1

## 2016-07-25 MED FILL — ASPIRIN 81 MG TABLET,ENTERIC COATED: 81 mg | ORAL | Qty: 1

## 2016-07-25 MED FILL — LANTUS U-100 INSULIN 100 UNIT/ML SUBCUTANEOUS SOLUTION: 100 [IU]/mL | SUBCUTANEOUS | Qty: 0.4

## 2016-07-25 MED FILL — FUROSEMIDE 40 MG TABLET: 40 mg | ORAL | Qty: 1

## 2016-07-25 MED FILL — DIPHENHYDRAMINE 25 MG CAPSULE: 25 mg | ORAL | Qty: 1

## 2016-07-25 MED FILL — ATORVASTATIN 20 MG TABLET: 20 mg | ORAL | Qty: 1

## 2016-07-25 NOTE — Progress Notes (Signed)
Daily Progress Note   Name: Evan [name]  DOB: 1945-08-04 71 y.o.  MRN: [id number]  CSN: [id number]  MWU:[name]    Subjective:   Chief Complaint:  Cellulitis, acute renal failure, leg edema, drug rash  ?  Evan [name]?is a 71 y.o.?male?patient. Past medical history of morbid obesity, hypertension, sleep apnea on CPAP, chronic kidney disease stage III, type 2 diabetes mellitus (was tried on multiple oral medications in the past and recent A1c was still around 8 therefore he was switched outpatient to Lantus starting 10 units with instructions to increase by 2 units every day on a sliding scale and he reached 12 units prior to admission). Presented with increasing weakness, fever and a syncopal episode. EMS called and he was hypoxic and was found to be septic with lactic acid of 4. He had acute kidney injury on presentation with creatinine up to 1.8 and elevated troponin at 0.11 and subsequently peaked at 7.7. He did not have chest pain. He complained of feeling weak with cough and shortness of breath. Stress test was moderate probability for underlying CAD. Cardiology recommended medical management unless he was having ongoing chest pain. Blood sugars were elevated and his Lantus was increased. Echocardiogram showed right heart strain but the patient has history of obstructive sleep apnea and obesity.. He had ultrasound of the lower extremities which did not show evidence of DVT. CT pulmonary angiogram showed no evidence of pulmonary emboli. Reportedly the patient developed contrast-induced nephropathy. His creatinine peaked at 5.1 nephrology consulted.     No headache no fever or chills. Urinating good amount. No nausea or vomiting eating well no diarrhea no abdominal pain. No chest pain or shortness of breath. Feels that prednisone is helping with the rash.    Objective:     Vitals Current 24 Hour Min / Max      Temp    37.3 ?C (99.1 ?F)    Temp  Min: 36.4 ?C (97.5 ?F)  Max: 37.3 ?C (99.1 ?F)    BP     122/81     BP  Min: 98/58  Max: 137/61      HR    69    Pulse  Min: 57  Max: 74      RR    18    Resp  Min: 18  Max: 20      Sats      SpO2: 97 % on  L/min       SpO2  Min: 94 %  Max: 97 %      Weight    (!) 179.6 kg (395 lb 15.1 oz)  Body mass index is 51.95 kg/m?[name]    Admit: (!) 181.4 kg (400 lb)   Intake/Ouput:    Intake/Output Summary (Last 24 hours) at 07/25/16 0936  Last data filed at 07/25/16 0501   Gross per 24 hour   Intake             1622 ml   Output             2580 ml   Net             -958 ml      Drains output:       Exam:  Constitutional: No acute distress obese gentleman  Eyes:  PER  HENT:  supple   Respiratory:  No respiratory distress, normal breath sounds, no rales, no wheezing   Cardiovascular:  Normal rate, normal rhythm, no murmurs, no  gallops, no rubs   GI:  Soft, Nontender  Musculoskeletal:  Bilateral leg edema  Neurologic:  Alert & oriented x 3, no focal deficits noted   Psychiatric:  Speech and behavior appropriate   Skin: Diffuse maculopapular rash with a few targetoid lesions sparing the palms appears to be improving      Scheduled Medications:  . aspirin  81 mg Oral Daily   . atorvastatin  20 mg Oral QPM   . ceFAZolin (ANCEF) IV  1 g Intravenous Q12H   . furosemide  60 mg Oral Daily   . heparin  5,000 Units Subcutaneous Q8H SCH   . hydrocortisone   Topical 2 times per day   . insulin aspart   Subcutaneous 4x Daily - AC and HS   . insulin aspart   Subcutaneous TID with meals   . insulin glargine  40 Units Subcutaneous Nightly   . metoprolol tartrate  150 mg Oral 2 times per day   . polyethylene glycol  17 g Oral Daily   . predniSONE  20 mg Oral 2 times per day   . senna-docusate  2 tablet Oral 2 times per day   . sodium chloride 0.9 % (NS) syringe  5-10 mL Intravenous Q8H SCH     Infusions:   PRN Medications: acetaminophen, benzocaine-menthol, bisacodyl, dextrose, dextrose, dextrose, diphenhydrAMINE, diphenhydrAMINE, glucagon (human recombinant), HYDROcodone-acetaminophen,  magnesium hydroxide, melatonin, nicotine polacrilex, phenol, polyvinyl alcohol, sodium chloride 0.9 % (NS) syringe, urea  Recent Labs:  Results for orders placed or performed during the hospital encounter of 07/11/16 (from the past 24 hour(s))   POCT Glucose -Next Routine    Collection Time: 07/24/16 12:01 PM   Result Value Ref Range    POC Glucose 191 (H) 80 - 99 mg/dL   POCT Glucose -Next Routine    Collection Time: 07/24/16  4:42 PM   Result Value Ref Range    POC Glucose 227 (H) 80 - 99 mg/dL   POCT Glucose -Next Routine    Collection Time: 07/24/16  9:26 PM   Result Value Ref Range    POC Glucose 256 (H) 80 - 99 mg/dL   CBC with Auto Differential -AM Draw    Collection Time: 07/25/16  4:16 AM   Result Value Ref Range    WBC 20.4 (H) 4.8 - 10.8 10*3/?L    RBC 3.22 (L) 4.70 - 6.10 10*6/?L    Hemoglobin 10.6 (L) 12.0 - 18.0 g/dL    HCT 16.1 (L) 09.6 - 54.0 %    MCV 101.4 (H) 81.0 - 99.0 fL    MCH 32.9 27.0 - 34.0 pg    MCHC 32.5 32.0 - 36.0 g/dL    RDW 04.5 40.9 - 81.1 %    Platelet Count 337 150 - 400 10*3/?L    MPV 9.3 7.4 - 10.4 fL    Neutrophils % 95 (H) 35 - 70 %    Lymphocytes % 5 (L) 25 - 45 %    Monocytes % 0 0 - 12 %    Eosinophils % 0 0 - 7 %    Basophils % 0 0 - 2 %    Neutrophils, Absolute 19.4 (H) 1.6 - 7.3 10*3/?L    Lymphocytes, Absolute 1.0 (L) 1.1 - 4.3 10*3/?L    Monocytes, Absolute 0.0 0.0 - 1.2 10*3/?L    Eosinophils, Absolute 0.0 0.0 - 0.7 10*3/?L    Basophils, Absolute 0.0 0.0 - 0.2 10*3/?L    Differential Type Manual Differential  Platelet Estimate Normal Normal    RBC Morphology Normal    Basic Metabolic Panel -AM Draw    Collection Time: 07/25/16  4:16 AM   Result Value Ref Range    Sodium 137 135 - 143 mmol/L    Potassium 5.0 3.5 - 5.1 mmol/L    Chloride 108 98 - 111 mmol/L    CO2 - Carbon Dioxide 17.1 (L) 21.0 - 31.0 mmol/L    Glucose 258 (H) 80 - 99 mg/dL    BUN 97 (H) 6 - 23 mg/dL    Creatinine 0.98 (H) 0.65 - 1.30 mg/dL    Calcium 8.9 8.6 - 11.9 mg/dL    Anion Gap 14.7 (H) 3.0 -  11.0 mmol/L    GFR Estimate 18 (L) >=60 mL/min/1.53m*2    GFR Additional Info     POCT Glucose -Next Routine    Collection Time: 07/25/16  6:10 AM   Result Value Ref Range    POC Glucose 235 (H) 80 - 99 mg/dL     Pending Labs     Order Current Status    Wound Culture, Superficial (Aerobic) -Next Routine Preliminary result        Recent Imaging:  No results found.      ASSESMENT:     - Severe sepsis on presentation. resolved  ?  - Left lower extremity cellulitis. We'll have to ask wound care for a picture to assess progression  ?  - Leukocytosis: persistent likely steroids  ?  - Syncope. Could've been due to sepsis and hypotension. CT chest did not show evidence of PE. Echocardiogram showed evidence of right heart strain but the patient has underlying history of morbid obesity and obstructive sleep apnea.   ?  - Non-STEMI. Troponin peaked at 7.7. Stress test showed moderate likelihood for flow-limiting CAD . Dr. Marisue Humble discussed with cardiology and they recommended medical management unless the patient was having ongoing chest pain. Believe this is the best approach given his renal function.   ?  - Acute kidney injury superimposed on chronic kidney disease stage III. Baseline creatinine 1.3 . Suspected secondary to contrast nephropathy. Nephrology signed off. Creatinine peaked at 5.10 and gradually improving.   ?  - Hyperkalemia. Improved  ?  - Type 2 diabetes mellitus on insulin with hyperglycemia. Metformin and Amaryl on hold. Glucose better controlled   ?  - Edema. Most likely volume overload with acute kidney injury and right heart failure  ?  - Diffuse Maculopapular rash. Most likely drug rash suspected due to Levaquin. Doubt erythema multiforme with sparing of the palms and mucosa  ?  - Morbid obesity  ?  - HTN. Stable. Lisinopril and HCTZ on hold due to acute kidney injury    PLAN:     - Continue prednisone 40 mg po daily D#2 for drug rash  ?  - Continue Ancef. Bili evaluation of cellulitis  ?  - continue ASA  , statin,  and beta blocker for medical management of CAD  ?  - Lasix 60 mg by mouth daily  ?  - monitor kidney function and avoid nephrotoxic medications. I/Os  ?  - Continue current insulin regimen  ?  - pharmacy for renal dosing of medications  ?  - CPAP.   ?  - Wound care  ?  - Physical therapy  ?  DVT prophylaxis: Heparin subcutaneous  ?   LOS: 13 days  Days  Total time spent: 26 minutes.   Electronically signed:Jens Siems Abdulhadi2/7/20189:36 AM  This dictation was produced using voice recognition software. Despite concurrent proofreading, please note transcription errors may be present and that these errors may not truly reflect my intent.

## 2016-07-25 NOTE — Treatment Summary (Signed)
IP PT TREATMENT NOTE     Patient:  Evan [name] / [id number] DOB:  07/14/1945 / 71 y.o. Date:  07/25/2016     Precautions  Specific mobility precautions: None     ASSESSMENT     Summary:  Patient was able to perform supine to sit with head of bed elevated to assist. Patient ambulated 100' with FWW and SBA/CGA, no LOB, no SOB. Patient was not using a walker prior to hospitalization and will need one upon discharge. Pending progress, he may be able to return home with Home Health PT. Too early to determine.     Able to be up with Nursing Team (x 1).     Activity tolerance: Tolerates 30 min activity with multiple rests  Comments: using walker now vs no assistive device at home     Precaution Awareness: No specific precautions  Deficit Awareness: Fully aware of deficits  Correction of Errors: Self-corrects with cues  Safety Judgment: Good awareness of safety     Consult recommendations  Pt. would benefit from Discharge Planner consult.   Discharge recommendations  Mobility Equipment needs: Front wheeled walker (FWW)   Primary recommendation : Too early to determine post-discharge recommendations.  Recommended transport method: Wheelchair  Secondary recommendation: Patient would benefit from SNF/Nursing Home/Other Skilled Facility.  Recommended transport method: Wheelchair  Justification: pending progress, may be able to return home with Home Health PT, below baseline, using walker vs none at home      PLAN     Treatment Plan Frequency    Plan: Continue per established POC.  Pt. demonstrates limitations which require skilled PT intervention.  1x (5 days/week).  Frequency will be increased as pt's condition or discharge plan indicates.     Evan [name] will remain on the Physical Therapy schedule until goals are met, or there has been a change in the Plan of Care.          Treatment Diagnosis Surgery / # Days Post-op (if applicable)    Primary Dx: Sepsis, cellulitis  Treatment Dx: Impaired mobility   /              SUBJECTIVE   Patient lying in bed, sleeping, wearing cpap. Agreeable to out of bed and gait with PT.     Pain Level  Current status: Pain does not limit pt's ability to participate in therapy  Pain location: Generalized (rash)  Pain descriptors: Itching     OBJECTIVE        Mental status  General Demeanor: Pleasant;Cooperative  LOC: Alert (initially sleeping with cpap)  Orientation: Oriented x 4  Directions: Follows multi-step commands  --> Follows multi-step commands: Consistently  Attention span: Appears intact  Memory: Appears intact in social/therapy situations   Medical appliances  Medical Appliances: Telemetry   Vitals  Vitals  Position: Lying  Activity: Rest (sleeping)  HR: 50's  2-6: Position 2  Position 2: Sitting  Activity 2: Immediate Post-Exercise  HR 2: 60's      TREATMENT  Bed Mobility/Transfers  Bed mobility       Rolling / logroll  Rolling R: SBA      Transition to sitting up Sidelying to Sit: SBA (head of bed elevated minimally)   To resting position      Transition to sitting down SBA     Transition to supine       Scooting     Transfers      Transition to standing From bed: SBA  Method        Gait/Stairs  Mobility - Gait (Level)  Gait (distance): 100 feet  Gait (assist level): CGA  Assistive device: Front wheeled walker (FWW)  Endurance: no SOB, no light headedness  Pace: slow  Pattern: Downward gaze;Decreased ankle DF L;Decreased ankle DF R;Decreased foot clearance L;Decreased foot clearance R;Decreased step length L;Decreased step length R  Surface: Level tile     Balance  Balance - Basic  Assist level: Distant Supervision  Position: Feet - supported;UE support - bilateral   Safety/Room Set-up   Call button accessible?: Yes  Phone accessible?: Yes  Oxygen reconnected?: No, on room air  Mobility in room per PT: Able to be up with Nursing Team (x 1).  Position on arrival: In bed  Position on departure (upright): Chair alarm on;In recliner chair     GOALS     Patient/Caregiver goals reviewed  and integrated with rehab treatment plan:      Multidisciplinary Problems (Active)        Problem: IP General Goal List - 2    Goal Priority Disciplines Outcome   Bed Mobility     PT    Description:  Pt. to perform bed mobility with min assist.    Transfers     PT    Description:  Pt. to perform all functional transfers with min. assist.    Ambulation - Levels     PT    Description:  Pt. to ambulate with min assist x 50 feet with appropriate assistive device.    Ambulation - Stairs     PT    Description:  Pt. to ascend/descend stairs with min assist, as per discharge environment.    Home Exercise Program     PT    Description:  Pt. to perform basic HEP with min assist.    Caregiver Training     PT    Description:  Patient/caregiver training will be provided, as needed to achieve goals.             Problem: Patient/Family Goal    Goal Priority Disciplines Outcome   Normal Status     PT    Description:  Pt. wants to return to previous level of function.     Home     PT    Description:  Pt. wants to return to previous living situation.    Mobility     PT    Description:  Patient wants to walk again.                          First session Second session (if applicable)    Start/Stop times: 1422 - 1459 Start/Stop times:   - Stop Time:     Total time: 37 minutes  Total time:   minutes     This note to serve as a Discharge Summary if Evan [name] is discharged from the hospital or from therapy services.     Therapist:  Marlou Starks, PTA

## 2016-07-25 NOTE — Progress Notes (Signed)
Assumed care of pt at 0400. Agree with previous RN assessment. Pt having some itching related to rash on body.  PO benadryl given with good relief. 2-3+ edema noted on lower extremities. Palpable pulses noted.

## 2016-07-25 NOTE — Progress Notes (Signed)
End of Shift Report     Significant Events Worked with PT      Relevant   Assessment Findings No benadryl given during shift  Out of bed for meals  Increased CBG readings: 257, 271   Pictures of wound show improvement.  Wound care redressed wound.       Patient/Family  Questions/Concerns      DC Barriers IV antibiotics   General Medicine Patients       Is the patient voiding or having normal catheter output? yes      Is the patient eating/drinking adequately? yes      Last bowel movement?   2/6      What is the stability of patient?  VSS, MEWS 1      How is pain managed and controlled? (last dose) No pain reported       Calls made to patient care provider   none      New orders related to changes in patient condition?  n/a       Miscellaneous information from the shift none

## 2016-07-25 NOTE — Discharge Planning (AHS/AVS) (Signed)
Initiated prior authorization with [employer].

## 2016-07-25 NOTE — Progress Notes (Signed)
07/25/16 0731 07/25/16 0734 07/25/16 0737   Vital Signs   Temp 37.3 ?C (99.1 ?F) --  --    Temp src Temporal --  --    Pulse 57 60 69   Heart Rate Source Monitor Monitor Monitor   Resp 18 --  --    BP 133/62 137/61 122/81   EPIC Calculated MAP (mmHg) 86 mmHg 86 mmHg 95 mmHg   BP Location Right arm Right arm Right arm   BP Method Non-Invasive Automatic Non-Invasive Automatic Non-Invasive Automatic   Patient Position Lying Sitting Standing   Oxygen Therapy   SpO2 96 % 97 % 97 %   Pain Assessment   Pain Assessment 0-10 --  --    Pain Score Zero --  --

## 2016-07-25 NOTE — Progress Notes (Signed)
Wound Care Note:  Reassess wounds and change dressing   Patient name: Evan [name] Room: 6477/6477-01 Time: 11:46 AM   Date of Birth: 09-29-1945  Date: 07/25/2016     Gender: male   MRN: [id number]   Attending: Berenice Primas, MD     Admitting Dx: Syncope and collapse [R55];Fever, unknown origin [R50.9];Weakness generalized [R53.1];Hyperglycemia [R73.9];Elevated troponin [R74.8];COPD exacerbation (CMS/HCC) [J44.1];Fever [R50.9];Cellulitis of left lower extremity [L03.116];Fall, initial encounter [W19.XXXA];Sepsis due to undetermined organism (CMS/HCC) [A41.9]            Allergies:   Allergies   Allergen Reactions   . Levofloxacin Rash     Maculopapular rash             Braden Scale:   Sensory Perceptions: Slightly limited  Moisture: Occasionally moist  Activity: Walks occasionally  Mobility: Slightly limited  Nutrition: Adequate  Friction and Shear: No apparent problem  Braden Scale Score: 18  On appropriate surface? Yes, compella bed     Labs:   Lab Results   Component Value Date    WHITEBLOODCE 20.4 (H) 07/25/2016    WHITEBLOODCE 16.5 (H) 07/24/2016    WHITEBLOODCE 18.6 (H) 07/23/2016    HGB 10.6 (L) 07/25/2016    HGB 10.9 (L) 07/24/2016    HGB 11.5 (L) 07/23/2016    HCT 32.6 (L) 07/25/2016    HCT 33.0 (L) 07/24/2016    HCT 34.8 (L) 07/23/2016    MCV 101.4 (H) 07/25/2016    MCV 101.0 (H) 07/24/2016    MCV 102.0 (H) 07/23/2016    LABPLAT 337 07/25/2016    LABPLAT 337 07/24/2016    LABPLAT 348 07/23/2016       No results for input(s): SEDRATE in the last 72 hours.  Recent Labs      07/24/16   0542   ALBUMIN  3.2*     No results for input(s): CRP in the last 72 hours.  Body mass index is 51.95 kg/m?[name]  Nutritional Status:  low albumin, anemic and High BMI    Neuro Assessment: Pt alert and cooperative with care    Fall Risk: Score: 60  Fall Risk: High  Precautions Observed        07/25/16 1040   Wounds and Other Ulcers 07/15/16  Leg Left;Anterior   Date First Assessed: 07/15/16   Wound Type: (c)   Location: Leg  Wound  Location Orientation: Left;Anterior  Pre-existing: Yes   State of Healing Healing   Wound Assessment (One small area of smooth white/pink)   Edges / Margins attached   Peri-wound Assessment erythemic  (edematous,rough dry yellow,raised dry white tissue )   Drainage Amount Scant   Drainage Description Serous   Closure None   Treatments Cleansed;Debrided: Mechanical   Dressing (Mep AG,sorbex, 2 layers Size G tubigrip)   Dressing Status Dressing Changed   Wounds and Other Ulcers 07/19/16  Leg Left;Posterior;Medial   Date First Assessed/Time First Assessed: 07/19/16 1347   Wound Type: (c)   Location: Leg  Wound Location Orientation: Left;Posterior;Medial  Pre-existing: No   State of Healing Healing   Wound Assessment Epithelizalization  (Some dry rough Rufo tissue)   Peri-wound Assessment (edematous, dry [place]lyn)   Drainage Amount None   Closure None   Treatments Cleansed   Dressing (Mepilex AG,sorbex, double layer size G tubigrip)   Dressing Status Dressing Changed       Note deroofed blister on upper left, dry rough yellow tissue around the area. Anterior.    Lateral.  Dry flaky  Beza.      Isolation Status:  standard precautions observed    Pain - Prior: 0  During: 0  After: 0  Denies wound pain  Family Present: other: sister    Change in History: improving    Last dressing change: 2/5    Debridement:   Mechanical: gauze was used to debride deflated blister skin from 3x3cm area of smooth white moist anterior leg area.     Treatment/Rationale: Wounds mostly resolved, only one 3x3 open area noted on anterior leg where the deflated blister skin rolled off during cleansing.  I applied Attractain lotion to the dry patchy areas as the urea in the product will help to soften the rough dry tissue in the wound area and around bilat feet.  Mepilex AG foam over the wound areas to absorb any drainage, contribute antimicrobial properties of silver to the small open area. Covered with the same Sorbex extra absorbent pads placed on  2/5 as there was no drainage on them.  I kept these in place incase drainage/edema returns.  Placed a double layer of size G tubigrips to reduce edema.  I measured the RLE at 51cm and applied a single layer of size F tubigrips for mod compression to keep this leg blister free.    Lift Assist: moderate assist  Care Teamed with: No one      Education:    Who: patient and family      What reviewed: Wound Care application of tubigrips, need to elevate, how to apply compression when he is at home.   Pt understanding: Will need reinforcements  Coordination of Care with: patient    Impression: Leg wounds resolving, unsure of dry rough yellow tissue in a horseshoe shape on the anterior leg where it was open previously.  Will remeasure LLE within 5-7 days to see if a different size tubigrip is needed to maintain mod (10-55mmHg) compression.    Recommendations:   Dressing changes to be done 2x week by floor nurses.  Wound Care team to follow-up 5-7 days.      Billable Items: 2-6x6 Mepilex AG, 1 yd size G tubigrip, 0.5yd size F tubigrip

## 2016-07-25 NOTE — Progress Notes (Signed)
End of Shift Report     Significant Events Report given to Tracey RN at 0400.      Relevant   Assessment Findings Redness continues across entire body. Decreased since previous assessment. +2 to +3 pitting edema in the lower extremities bilaterally.       Patient/Family  Questions/Concerns None stated from pt     DC Barriers    General Medicine Patients       Is the patient voiding or having normal catheter output? Yes      Is the patient eating/drinking adequately? Yes      Last bowel movement?   2/6      What is the stability of patient?  VSS      How is pain managed and controlled? (last dose) No pain meds wanted       Calls made to patient care provider   N/A      New orders related to changes in patient condition?  N/A       Miscellaneous information from the shift N/A   Dialysis Patients        When was last dialysis and were there any issues?       When is next dialysis planned any special instructions?

## 2016-07-26 LAB — POCT GLUCOSE
POC Glucose: 271 mg/dL — ABNORMAL HIGH (ref 80–99)
POC Glucose: 313 mg/dL — ABNORMAL HIGH (ref 80–99)
POC Glucose: 279 mg/dL — ABNORMAL HIGH (ref 80–99)
POC Glucose: 303 mg/dL — ABNORMAL HIGH (ref 80–99)

## 2016-07-26 LAB — CBC WITH AUTO DIFFERENTIAL
Basophils %: 0 % (ref 0–2)
Basophils, Absolute: 0 10*3/ÂµL (ref 0.0–0.2)
Eosinophils %: 1 % (ref 0–7)
Eosinophils, Absolute: 0.2 10*3/ÂµL (ref 0.0–0.7)
HCT: 31.4 % — ABNORMAL LOW (ref 42.0–54.0)
Hemoglobin: 10.2 g/dL — ABNORMAL LOW (ref 12.0–18.0)
Lymphocytes %: 5 % — ABNORMAL LOW (ref 25–45)
Lymphocytes, Absolute: 1 10*3/ÂµL — ABNORMAL LOW (ref 1.1–4.3)
MCH: 32.9 pg (ref 27.0–34.0)
MCHC: 32.4 g/dL (ref 32.0–36.0)
MCV: 101.5 fL — ABNORMAL HIGH (ref 81.0–99.0)
MPV: 8.8 fL (ref 7.4–10.4)
Monocytes %: 2 % (ref 0–12)
Monocytes, Absolute: 0.4 10*3/ÂµL (ref 0.0–1.2)
Neutrophils %: 92 % — ABNORMAL HIGH (ref 35–70)
Neutrophils, Absolute: 18.1 10*3/ÂµL — ABNORMAL HIGH (ref 1.6–7.3)
Platelet Count: 323 10*3/??L (ref 150–400)
Platelet Estimate: NORMAL
RBC: 3.09 10*6/??L — ABNORMAL LOW (ref 4.70–6.10)
RDW: 14 % (ref 11.5–14.5)
WBC: 19.7 10*3/??L — ABNORMAL HIGH (ref 4.8–10.8)

## 2016-07-26 LAB — BASIC METABOLIC PANEL
Anion Gap: 9.8 mmol/L (ref 3.0–11.0)
BUN: 104 mg/dL — ABNORMAL HIGH (ref 6–23)
CO2 - Carbon Dioxide: 18.2 mmol/L — ABNORMAL LOW (ref 21.0–31.0)
Calcium: 8.8 mg/dL (ref 8.6–10.3)
Chloride: 107 mmol/L (ref 98–111)
Creatinine: 3.32 mg/dL — ABNORMAL HIGH (ref 0.65–1.30)
GFR Estimate: 18 mL/min/{1.73_m2} — ABNORMAL LOW (ref >=60)
Glucose: 333 mg/dL — ABNORMAL HIGH (ref 80–99)
Potassium: 5.1 mmol/L (ref 3.5–5.1)
Sodium: 135 mmol/L (ref 135–143)

## 2016-07-26 LAB — VITAMIN B12/FOLATE
Folate: 8 ng/mL (ref 5.9–24.8)
Vitamin B12: 686 pg/mL (ref 150–840)

## 2016-07-26 LAB — TSH: TSH - Thyroid Stimulating Hormone: 4.4 ??IU/mL (ref 0.45–5.33)

## 2016-07-26 MED FILL — HEPARIN, PORCINE (PF) 5,000 UNIT/0.5 ML SYRINGE: 5000 unit/0.5 mL | INTRAMUSCULAR | Qty: 0.5

## 2016-07-26 MED FILL — ASPIRIN 81 MG TABLET,ENTERIC COATED: 81 mg | ORAL | Qty: 1

## 2016-07-26 MED FILL — FUROSEMIDE 40 MG TABLET: 40 mg | ORAL | Qty: 1

## 2016-07-26 MED FILL — LANTUS U-100 INSULIN 100 UNIT/ML SUBCUTANEOUS SOLUTION: 100 [IU]/mL | SUBCUTANEOUS | Qty: 0.4

## 2016-07-26 MED FILL — PREDNISONE 20 MG TABLET: 20 mg | ORAL | Qty: 1

## 2016-07-26 MED FILL — CEFAZOLIN 1 GRAM/50 ML IN DEXTROSE (ISO-OSMOTIC) INTRAVENOUS PIGGYBACK: 1 gram/50 mL | INTRAVENOUS | Qty: 50

## 2016-07-26 MED FILL — METOPROLOL TARTRATE 50 MG TABLET: 50 mg | ORAL | Qty: 3

## 2016-07-26 MED FILL — ATORVASTATIN 20 MG TABLET: 20 mg | ORAL | Qty: 1

## 2016-07-26 MED FILL — NOVOLOG FLEXPEN U-100 INSULIN ASPART 100 UNIT/ML (3 ML) SUBCUTANEOUS: 100 unit/mL (3 mL) | SUBCUTANEOUS | Qty: 3

## 2016-07-26 MED FILL — FUROSEMIDE 20 MG TABLET: 20 mg | ORAL | Qty: 1

## 2016-07-26 MED FILL — DIPHENHYDRAMINE 25 MG CAPSULE: 25 mg | ORAL | Qty: 1

## 2016-07-26 MED FILL — HYDROCORTISONE 1 % TOPICAL CREAM: 1 % | TOPICAL | Qty: 28

## 2016-07-26 MED FILL — MIRALAX 17 GRAM ORAL POWDER PACKET: 17 g | ORAL | Qty: 1

## 2016-07-26 MED FILL — SENNA PLUS 8.6 MG-50 MG TABLET: 8.6-50 mg | ORAL | Qty: 2

## 2016-07-26 NOTE — Significant Event (Signed)
EKG obtained for change in tele strip- RBBB seems similar to prior EKG 07/12/16

## 2016-07-26 NOTE — Progress Notes (Addendum)
Daily Progress Note   Name: Evan [name]  DOB: 09/06/1945 71 y.o.  MRN: [id number]  CSN: [id number]  UXL:[id number] Evan [name]    Subjective:   Chief Complaint:  Cellulitis, acute renal failure, leg edema, drug rash  ?  Evan [name]?is a 71 y.o.?male?patient. Past medical history of morbid obesity, hypertension, sleep apnea on CPAP, chronic kidney disease stage III, type 2 diabetes mellitus (was tried on multiple oral medications in the past and recent A1c was still around 8 therefore he was switched outpatient to Lantus starting 10 units with instructions to increase by 2 units every day on a sliding scale and he reached 12 units prior to admission). Presented with increasing weakness, fever and a syncopal episode. EMS called and he was hypoxic and was found to be septic with lactic acid of 4. He had acute kidney injury on presentation with creatinine up to 1.8 and elevated troponin at 0.11 and subsequently peaked at 7.7. He did not have chest pain. He complained of feeling weak with cough and shortness of breath. Stress test was moderate probability for underlying CAD. Cardiology recommended medical management unless he was having ongoing chest pain. Blood sugars were elevated and his Lantus was increased. Echocardiogram showed right heart strain but the patient has history of obstructive sleep apnea and obesity.. He had ultrasound of the lower extremities which did not show evidence of DVT. CT pulmonary angiogram showed no evidence of pulmonary emboli. Reportedly the patient developed contrast-induced nephropathy. His creatinine peaked at 5.1 nephrology consulted.     Feeling better the rash is improving, no fever or chills, no headache or visual changes, no chest pain or shortness of breath, no abdominal pain nausea vomiting, no constipation.    Objective:     Vitals Current 24 Hour Min / Max      Temp    36.6 ?C (97.8 ?F)    Temp  Min: 36 ?C (96.8 ?F)  Max: 37.1 ?C (98.7 ?F)      BP     130/80     BP  Min:  117/66  Max: 140/60      HR    55    Pulse  Min: 52  Max: 69      RR    17    Resp  Min: 17  Max: 18      Sats      SpO2: 96 % on  L/min       SpO2  Min: 91 %  Max: 97 %      Weight    (!) 172.8 kg (380 lb 14.4 oz)  Body mass index is 49.98 kg/m?[name]    Admit: (!) 181.4 kg (400 lb)   Intake/Ouput:    Intake/Output Summary (Last 24 hours) at 07/26/16 0842  Last data filed at 07/26/16 0605   Gross per 24 hour   Intake             2856 ml   Output             2850 ml   Net                6 ml      Drains output:       Exam:  Constitutional: No acute distress  Eyes:   HENT: supple   Respiratory:  No respiratory distress, normal breath sounds, no rales, no wheezing   Cardiovascular:  Normal rate, normal rhythm, no murmurs, no gallops, no rubs  GI:  Soft, nontender  Musculoskeletal: leg edema  Neurologic:  Alert & oriented x 3, no focal deficits noted   Psychiatric:  Speech and behavior appropriate   Skin: Warm and dry. Reviewed images of cellulitis from yesterday showing improving erythema      Scheduled Medications:  . aspirin  81 mg Oral Daily   . atorvastatin  20 mg Oral QPM   . ceFAZolin (ANCEF) IV  1 g Intravenous Q12H   . furosemide  60 mg Oral Daily   . heparin  5,000 Units Subcutaneous Q8H SCH   . hydrocortisone   Topical 2 times per day   . insulin aspart   Subcutaneous 4x Daily - AC and HS   . insulin aspart   Subcutaneous TID with meals   . insulin glargine  40 Units Subcutaneous Nightly   . metoprolol tartrate  150 mg Oral 2 times per day   . polyethylene glycol  17 g Oral Daily   . predniSONE  20 mg Oral 2 times per day   . senna-docusate  2 tablet Oral 2 times per day   . sodium chloride 0.9 % (NS) syringe  5-10 mL Intravenous Q8H SCH     Infusions:   PRN Medications: acetaminophen, benzocaine-menthol, bisacodyl, dextrose, dextrose, dextrose, diphenhydrAMINE, diphenhydrAMINE, glucagon (human recombinant), HYDROcodone-acetaminophen, magnesium hydroxide, melatonin, nicotine polacrilex, phenol, polyvinyl  alcohol, sodium chloride 0.9 % (NS) syringe, urea  Recent Labs:  Results for orders placed or performed during the hospital encounter of 07/11/16 (from the past 24 hour(s))   POCT Glucose -Next Routine    Collection Time: 07/25/16 11:49 AM   Result Value Ref Range    POC Glucose 257 (H) 80 - 99 mg/dL   POCT Glucose -Next Routine    Collection Time: 07/25/16  4:39 PM   Result Value Ref Range    POC Glucose 271 (H) 80 - 99 mg/dL   POCT Glucose -Next Routine    Collection Time: 07/25/16  8:30 PM   Result Value Ref Range    POC Glucose 313 (H) 80 - 99 mg/dL   CBC with Auto Differential -AM Draw    Collection Time: 07/26/16  5:26 AM   Result Value Ref Range    WBC 19.7 (H) 4.8 - 10.8 10*3/?L    RBC 3.09 (L) 4.70 - 6.10 10*6/?L    Hemoglobin 10.2 (L) 12.0 - 18.0 g/dL    HCT 16.1 (L) 09.6 - 54.0 %    MCV 101.5 (H) 81.0 - 99.0 fL    MCH 32.9 27.0 - 34.0 pg    MCHC 32.4 32.0 - 36.0 g/dL    RDW 04.5 40.9 - 81.1 %    Platelet Count 323 150 - 400 10*3/?L    MPV 8.8 7.4 - 10.4 fL    Neutrophils % 92 (H) 35 - 70 %    Lymphocytes % 5 (L) 25 - 45 %    Monocytes % 2 0 - 12 %    Eosinophils % 1 0 - 7 %    Basophils % 0 0 - 2 %    Neutrophils, Absolute 18.1 (H) 1.6 - 7.3 10*3/?L    Lymphocytes, Absolute 1.0 (L) 1.1 - 4.3 10*3/?L    Monocytes, Absolute 0.4 0.0 - 1.2 10*3/?L    Eosinophils, Absolute 0.2 0.0 - 0.7 10*3/?L    Basophils, Absolute 0.0 0.0 - 0.2 10*3/?L    Differential Type Manual Differential     Platelet Estimate Normal Normal    Macrocytes  1+ (A) (none)   Basic Metabolic Panel -AM Draw    Collection Time: 07/26/16  5:26 AM   Result Value Ref Range    Sodium 135 135 - 143 mmol/L    Potassium 5.1 3.5 - 5.1 mmol/L    Chloride 107 98 - 111 mmol/L    CO2 - Carbon Dioxide 18.2 (L) 21.0 - 31.0 mmol/L    Glucose 333 (H) 80 - 99 mg/dL    BUN 161 (H) 6 - 23 mg/dL    Creatinine 0.96 (H) 0.65 - 1.30 mg/dL    Calcium 8.8 8.6 - 04.5 mg/dL    Anion Gap 9.8 3.0 - 11.0 mmol/L    GFR Estimate 18 (L) >=60 mL/min/1.12m*2    GFR Additional  Info     POCT Glucose -Next Routine    Collection Time: 07/26/16  5:54 AM   Result Value Ref Range    POC Glucose 279 (H) 80 - 99 mg/dL     Pending Labs     Order Current Status    Thyroid Stimulating Hormone -Next Routine In process        Recent Imaging:  No results found.      ASSESMENT and plan      - Severe sepsis on presentation likely due to cellulitis. resolved  ?  - Left lower extremity cellulitis. Improving. Continue Ancef. Switched to by mouth within the next 2 days in preparation for discharge. Continue wound care and compression  ?  - Leukocytosis: persistent likely steroids  ?  - Syncope. Could've been due to sepsis and hypotension. CT chest did not show evidence of PE. Echocardiogram showed evidence of right heart strain but the patient has underlying history of morbid obesity and obstructive sleep apnea.   ?  - Non-STEMI. Troponin peaked at 7.7. Stress test showed moderate likelihood for flow-limiting CAD . Dr. Marisue Humble discussed with cardiology and they recommended medical management unless the patient was having ongoing chest pain. Believe this is the best approach given his renal function. I recommended to the patient that point his kidney function improves and he is back to his baseline to discuss this with his primary care physician and to be referred to cardiology to discuss possible elective cardiac catheterization. Otherwise he doesn't have any chest pain and he can be discharged on medical treatment (aspirin, statin and beta blocker)  ?  - Acute kidney injury superimposed on chronic kidney disease stage III. Baseline creatinine 1.3 . Suspected secondary to contrast nephropathy. Nephrology signed off. Creatinine peaked at 5.10 and continues to gradually improve. Continue Lasix  ?  - Hyperkalemia. Improved    - Elevated MCV. Obtain TSH, B12/folate  ?  - Type 2 diabetes mellitus on insulin with hyperglycemia. Metformin and Amaryl on hold. Glucose levels are higher with prednisone. Continue  current insulin regimen , increase carb coverage  ?  - Edema. Most likely volume overload with acute kidney injury and right heart failure  ?  - Diffuse Maculopapular rash. Most likely drug rash suspected due to Levaquin. Doubt erythema multiforme with sparing of the palms and mucosa. Continue prednisone 40 mg po daily D#3 for drug rash  ?  - Morbid obesity  ?  - HTN. Stable. Lisinopril and HCTZ on hold due to acute kidney injury    - Obstructive sleep apnea on CPAP    - Physical therapy    DVT prophylaxis: Heparin subcutaneous   LOS: 14 days  Days  Total time spent: 26 minutes.  Electronically signed:Phillips Goulette Abdulhadi2/8/20188:42 AM  This dictation was produced using voice recognition software. Despite concurrent proofreading, please note transcription errors may be present and that these errors may not truly reflect my intent.

## 2016-07-26 NOTE — Progress Notes (Signed)
Assumed care of pt from RN [place] F at 1515.  Pt resting comfortably. Will continue to monitor

## 2016-07-26 NOTE — Other (Signed)
IP PT TREATMENT NOTE (NOT SEEN)    Patient:  Evan [name] DOB:  [birth year] / 71 y.o. Date:  07/26/2016     ASSESSMENT     No treatment provided.  Evan [name] was not seen for the following reason:    Patient side lying in bed, sleeping with cpap on. Patient refused out of bed with PT, I don't want to go for a hike and stating that his stomach is upset and he doesn't feel so good.      PLAN     Evan [name] will remain on the Physical Therapy schedule until goals are met, or there has been a change in the Plan of Care.          Treatment Diagnosis Surgery / # Days Post-op (if applicable)    Primary Dx: Sepsis, cellulitis  Treatment Dx: Impaired mobility   /         SUBJECTIVE   Patient was encouraged to walk in hall later today with nursing if he felt better. Patient reported that he has been up in chair and not in bed all day.            Session times   Start/Stop Times:  1550 - 1600   Total Time:  10 minutes      This note to serve as a Discharge Summary if Evan [name] is discharged from the hospital or from therapy services.     Therapist:  Marlou Starks, PTA

## 2016-07-26 NOTE — Progress Notes (Signed)
Diabetes Support Inpatient: Chart reviewed for follow-up    Evan [name] has been started on high dose steroid therapy of 20 units BID. His blood sugars have steadily increased since it's inception on 07/24/2016.     This mornings fasting CBG was 279 mg/dL and yesterdays was 784 mg/dL. His afternoon and evening CBG's are ranging between 257 mg/dL - 696 mg/dL.     His renal function is decreased as evidenced by SCr 3.32 and eGFR 18 however, he has not had any hypoglycemic events for this admission.    High dose steroid treatment often causes hyperglycemia in patients. Steroid induced hyperglycemia from twice daily steroid dosing may be best prevented/controlled by using NPH BID as doses can be coordinated with steroid dosing, and doses can be uptitrated twice daily vs once daily for Lantus. If this option is desired it is important to continue addressing the prandial needs for insulin.    Once daily Lantus dosing is another option, please consider a 70% nutritional + 30% basal regimen to address the hyperglycemia seen in the afternoons and evenings as steroids increase the glycemic response to carbohydrates.     Please consider:  Changing to NPH 20 units BID  Continuing Novolog 1 unit for each 5 grams CHO eaten  Continuing Novolog Medium Dose Correctional Scale      Or    Changing Lantus to 26 units QD  Changing Novolog to 20 units TID with meals  Continuing Medium Dose Correctional Scale    These recommendations aim to bring his blood sugars into target range and take into account his high dose steroid therapy.    Thank you

## 2016-07-27 LAB — CBC WITH AUTO DIFFERENTIAL
Basophils %: 1 % (ref 0–2)
Basophils, Absolute: 0.1 10*3/ÂµL (ref 0.0–0.2)
Eosinophils %: 3 % (ref 0–7)
Eosinophils, Absolute: 0.4 10*3/ÂµL (ref 0.0–0.7)
HCT: 32.4 % — ABNORMAL LOW (ref 42.0–54.0)
Hemoglobin: 10.8 g/dL — ABNORMAL LOW (ref 12.0–18.0)
Lymphocytes %: 5 % — ABNORMAL LOW (ref 25–45)
Lymphocytes, Absolute: 0.9 10*3/ÂµL — ABNORMAL LOW (ref 1.1–4.3)
MCH: 33.7 pg (ref 27.0–34.0)
MCHC: 33.5 g/dL (ref 32.0–36.0)
MCV: 100.6 fL — ABNORMAL HIGH (ref 81.0–99.0)
MPV: 9.3 fL (ref 7.4–10.4)
Monocytes %: 3 % (ref 0–12)
Monocytes, Absolute: 0.5 10*3/ÂµL (ref 0.0–1.2)
Neutrophils %: 89 % — ABNORMAL HIGH (ref 35–70)
Neutrophils, Absolute: 15.3 10*3/ÂµL — ABNORMAL HIGH (ref 1.6–7.3)
Platelet Count: 325 10*3/ÂµL (ref 150–400)
RBC: 3.22 10*6/ÂµL — ABNORMAL LOW (ref 4.70–6.10)
RDW: 14 % (ref 11.5–14.5)
WBC: 17.2 10*3/ÂµL — ABNORMAL HIGH (ref 4.8–10.8)

## 2016-07-27 LAB — POCT GLUCOSE
POC Glucose: 361 mg/dL — ABNORMAL HIGH (ref 80–99)
POC Glucose: 356 mg/dL — ABNORMAL HIGH (ref 80–99)
POC Glucose: 326 mg/dL — ABNORMAL HIGH (ref 80–99)
POC Glucose: 377 mg/dL — ABNORMAL HIGH (ref 80–99)

## 2016-07-27 LAB — BASIC METABOLIC PANEL
Anion Gap: 10.5 mmol/L (ref 3.0–11.0)
BUN: 103 mg/dL — ABNORMAL HIGH (ref 6–23)
CO2 - Carbon Dioxide: 18.5 mmol/L — ABNORMAL LOW (ref 21.0–31.0)
Calcium: 8.9 mg/dL (ref 8.6–10.3)
Chloride: 104 mmol/L (ref 98–111)
Creatinine: 3.06 mg/dL — ABNORMAL HIGH (ref 0.65–1.30)
GFR Estimate: 20 mL/min/{1.73_m2} — ABNORMAL LOW (ref >=60)
Glucose: 349 mg/dL — ABNORMAL HIGH (ref 80–99)
Potassium: 5.5 mmol/L — ABNORMAL HIGH (ref 3.5–5.1)
Sodium: 133 mmol/L — ABNORMAL LOW (ref 135–143)

## 2016-07-27 MED FILL — MIRALAX 17 GRAM ORAL POWDER PACKET: 17 g | ORAL | Qty: 1

## 2016-07-27 MED FILL — METOPROLOL TARTRATE 50 MG TABLET: 50 mg | ORAL | Qty: 3

## 2016-07-27 MED FILL — ASPIRIN 81 MG TABLET,ENTERIC COATED: 81 mg | ORAL | Qty: 1

## 2016-07-27 MED FILL — DIPHENHYDRAMINE 25 MG CAPSULE: 25 mg | ORAL | Qty: 1

## 2016-07-27 MED FILL — FUROSEMIDE 40 MG TABLET: 40 mg | ORAL | Qty: 1

## 2016-07-27 MED FILL — HYDROCORTISONE 1 % TOPICAL CREAM: 1 % | TOPICAL | Qty: 28

## 2016-07-27 MED FILL — CEFAZOLIN 1 GRAM/50 ML IN DEXTROSE (ISO-OSMOTIC) INTRAVENOUS PIGGYBACK: 1 gram/50 mL | INTRAVENOUS | Qty: 50

## 2016-07-27 MED FILL — PREDNISONE 20 MG TABLET: 20 mg | ORAL | Qty: 1

## 2016-07-27 MED FILL — LANTUS U-100 INSULIN 100 UNIT/ML SUBCUTANEOUS SOLUTION: 100 [IU]/mL | SUBCUTANEOUS | Qty: 0.4

## 2016-07-27 MED FILL — ATORVASTATIN 20 MG TABLET: 20 mg | ORAL | Qty: 1

## 2016-07-27 MED FILL — SENNA PLUS 8.6 MG-50 MG TABLET: 8.6-50 mg | ORAL | Qty: 2

## 2016-07-27 MED FILL — HEPARIN, PORCINE (PF) 5,000 UNIT/0.5 ML SYRINGE: 5000 unit/0.5 mL | INTRAMUSCULAR | Qty: 0.5

## 2016-07-27 MED FILL — FUROSEMIDE 20 MG TABLET: 20 mg | ORAL | Qty: 1

## 2016-07-27 NOTE — Progress Notes (Addendum)
End of Shift Report     Significant Events Patient sinus brady intermittently.   Notified Dr. Neta Mends about Metoprolo order, advised to hold due to HR of 54.     Relevant   Assessment Findings Redness all extremities, stomach, back, and face.  1+ pitting edema lower extremities. Non pitting edema upper extremities.      Patient/Family  Questions/Concerns ECG last night.  Blood sugar levels.     DC Barriers IV abx. Uncontrolled blood sugar levels.    General Medicine Patients       Is the patient voiding or having normal catheter output? Yes      Is the patient eating/drinking adequately? Yes      Last bowel movement?   07/26/16      What is the stability of patient?  MEWS 1      How is pain managed and controlled? (last dose) None reported       Calls made to patient care provider   Yes      New orders related to changes in patient condition?  No       Miscellaneous information from the shift N/A   Dialysis Patients        When was last dialysis and were there any issues?       When is next dialysis planned any special instructions?

## 2016-07-27 NOTE — Other (Signed)
IP PT TREATMENT NOTE (NOT SEEN)    Patient:  Evan [name] DOB:  1946/05/30 / 71 y.o. Date:  07/27/2016     ASSESSMENT     No treatment provided.  Evan [name] was not seen for the following reason:    Procedure/unavailable: Pt with wound care     PLAN     Evan [name] will remain on the Physical Therapy schedule until goals are met, or there has been a change in the Plan of Care. Pt will return later today as time allows.          Treatment Diagnosis Surgery / # Days Post-op (if applicable)    Primary Dx: Sepsis, cellulitis  Treatment Dx: Impaired mobility   /         SUBJECTIVE   Pt unavailable at this time. Pt w/ wound care.     OBJECTIVE   NA          Session times   Start/Stop Times:   -     Total Time:   minutes      This note to serve as a Discharge Summary if Evan [name] is discharged from the hospital or from therapy services.     Therapist:  Caron Presume, PTA

## 2016-07-27 NOTE — Treatment Summary (Signed)
IP PT TREATMENT NOTE     Patient:  Evan [name] / [id number] DOB:  1945-09-01 / 71 y.o. Date:  07/27/2016     Precautions  Specific mobility precautions: None     ASSESSMENT     Summary: Pt tolerated session fair. Pt fatigues easily and requires frequent rests. Bed mobility (rolling side to side, up to EOB & scooting) SBA and use of handrails and elevated HOB. Gait x 150' w/ FWW (CGA) wide BOS, decreased step length, multiple standing rests, stable no LOB. Pt is most limited by general weakness & fatigue. Pt may benefit from SNF placement to continue rehab for mobility, strength & endurance pending progress.     Able to be up with Nursing Team (x 1).     Activity tolerance: Tolerates 30 min activity with multiple rests     Precaution Awareness: No specific precautions  Deficit Awareness: Fully aware of deficits  Correction of Errors: Self-corrects with cues  Safety Judgment: Good awareness of safety     Consult recommendations  Pt. would benefit from Discharge Planner consult.   Discharge recommendations  Mobility Equipment needs: Front wheeled walker (FWW)   Primary recommendation : Too early to determine post-discharge recommendations.  Recommended transport method: Wheelchair  Secondary recommendation: Patient would benefit from SNF/Nursing Home/Other Skilled Facility.  Recommended transport method: Wheelchair  Justification: pending progress, may be able to return home with Home Health PT, below baseline, using walker vs none at home      PLAN     Treatment Plan Frequency    Plan: Continue per established POC.  Pt. demonstrates limitations which require skilled PT intervention.  1x (5 days/week).  Frequency will be increased as pt's condition or discharge plan indicates.     Evan [name] will remain on the Physical Therapy schedule until goals are met, or there has been a change in the Plan of Care.          Treatment Diagnosis Surgery / # Days Post-op (if applicable)    Primary Dx: Sepsis,  cellulitis  Treatment Dx: Impaired mobility   /         SUBJECTIVE   Pt asleep supine in bed, wakens to voice, agreeable to PT . No c/o pain.     Pain Level  Current status: Pain does not limit pt's ability to participate in therapy     OBJECTIVE   Bed mobility up to EOB, scooting, rolling. Gait x 150' w/ FWW, supine therEx, assist w/ urinal, BTB positioned for comfort     Mental status  General Demeanor: Pleasant;Cooperative  LOC: Alert  Orientation:  (initially asleep w. CPAP)  Directions: Follows multi-step commands  --> Follows multi-step commands: Consistently  Attention span: Appears intact  Memory: Appears intact in social/therapy situations   Medical appliances  Medical Appliances: Telemetry     Other  Observation  Edema: BLE      TREATMENT  Bed Mobility/Transfers  Bed mobility       Rolling / logroll  Rolling R: SBA      Transition to sitting up Sidelying to Sit: SBA (elevated HOB)   To resting position      Transition to sitting down SBA     Transition to supine SBA     Scooting SBA   Transfers      Transition to standing From bed: SBA     Method        Gait/Stairs  Mobility - Gait (Level)  Gait (distance): 150  feet  Gait (assist level): CGA  Assistive device: Front wheeled walker (FWW)  Endurance: fair  Pace: slow  Pattern: Downward gaze;Wide BOS;Decreased step length L;Decreased step length R  Surface: Level tile    Ther. Ex.  LE exercises: Ankle pumps;Short arc quads;Quad sets;Heelslides  Ankle pumps: 10  Heelslides: 10  Quad sets: 10  Short arc quads: 10    Balance  Balance - Basic  Static sitting balance: Assist level;Position;Time  Assist level: Distant Supervision  Position: Feet - supported  Time: 5  Static standing balance: Assist level  Assist level: SBA  Dynamic standing balance: Assist level  Assist level with weight shifts: SBA   Training/Education      Safety/Room Set-up   Mobility in room per PT: Able to be up with Nursing Team (x 1).  Call button accessible?: Yes  Phone accessible?:  Yes  Oxygen reconnected?: No, on room air  Position on arrival: In bed  Position on departure (bed): Bed in lowest position     GOALS     Patient/Caregiver goals reviewed and integrated with rehab treatment plan:      Multidisciplinary Problems (Active)        Problem: IP General Goal List - 2    Goal Priority Disciplines Outcome   Bed Mobility     PT    Description:  Pt. to perform bed mobility with min assist.    Transfers     PT    Description:  Pt. to perform all functional transfers with min. assist.    Ambulation - Levels     PT    Description:  Pt. to ambulate with min assist x 50 feet with appropriate assistive device.    Ambulation - Stairs     PT    Description:  Pt. to ascend/descend stairs with min assist, as per discharge environment.    Home Exercise Program     PT    Description:  Pt. to perform basic HEP with min assist.    Caregiver Training     PT    Description:  Patient/caregiver training will be provided, as needed to achieve goals.             Problem: Patient/Family Goal    Goal Priority Disciplines Outcome   Normal Status     PT    Description:  Pt. wants to return to previous level of function.     Home     PT    Description:  Pt. wants to return to previous living situation.    Mobility     PT    Description:  Patient wants to walk again.                        G-Codes/PSFS/FIM Scores: (if applicable)          First session Second session (if applicable)    Start/Stop times: 1405 - 1440 Start/Stop times:   - Stop Time:     Total time: 35 minutes  Total time:   minutes     This note to serve as a Discharge Summary if Philip Kotlyar is discharged from the hospital or from therapy services.     Therapist:  Caron Presume, PTA

## 2016-07-27 NOTE — Progress Notes (Signed)
Wound Care Note:  Follow up Assessment    Patient name: Evan [name] Room: 6477/6477-01 Time: 12:39 PM   Date of Birth: 1946-01-04  Date: 07/27/2016     Gender: male   MRN: [id number]   Attending: Sherron Ales, MD     Admitting Dx: Syncope and collapse [R55];Fever, unknown origin [R50.9];Weakness generalized [R53.1];Hyperglycemia [R73.9];Elevated troponin [R74.8];COPD exacerbation (CMS/HCC) [J44.1];Fever [R50.9];Cellulitis of left lower extremity [L03.116];Fall, initial encounter [W19.XXXA];Sepsis due to undetermined organism (CMS/HCC) [A41.9]            Allergies:   Allergies   Allergen Reactions   . Levofloxacin Rash     Maculopapular rash             Braden Scale:   Sensory Perceptions: Slightly limited  Moisture: Occasionally moist  Activity: Walks occasionally  Mobility: Slightly limited  Nutrition: Excellent  Friction and Shear: No apparent problem  Braden Scale Score: 19  On appropriate surface? Yes     Labs:   Lab Results   Component Value Date    WHITEBLOODCE 17.2 (H) 07/27/2016    WHITEBLOODCE 19.7 (H) 07/26/2016    WHITEBLOODCE 20.4 (H) 07/25/2016    HGB 10.8 (L) 07/27/2016    HGB 10.2 (L) 07/26/2016    HGB 10.6 (L) 07/25/2016    HCT 32.4 (L) 07/27/2016    HCT 31.4 (L) 07/26/2016    HCT 32.6 (L) 07/25/2016    MCV 100.6 (H) 07/27/2016    MCV 101.5 (H) 07/26/2016    MCV 101.4 (H) 07/25/2016    LABPLAT 325 07/27/2016    LABPLAT 323 07/26/2016    LABPLAT 337 07/25/2016       No results for input(s): SEDRATE in the last 72 hours.  No results for input(s): ALBUMIN in the last 72 hours.  No results for input(s): CRP in the last 72 hours.  Body mass index is 49.98 kg/m?[name]  Nutritional Status:  anemic and High BMI    Neuro Assessment: Alert and cooperative     Fall Risk: Score: 60  Fall Risk: High  Precautions Observed     Isolation Status:  Standard precautions observed    Pain - Prior: 0  During: 0  After: 0    Family Present: none    Last dressing change: 2/7    Debridement: none       07/27/16 1100   Wound (LDAs)    Type of Wound (LDA) Wound   Wounds and Other Ulcers 07/15/16  Leg Left;Anterior   Date First Assessed: 07/15/16   Wound Type: (c)   Location: Leg  Wound Location Orientation: Left;Anterior  Pre-existing: Yes   State of Healing Healing   Wound Assessment (old de-roofed blister - 2 small open areas)   Shape (2 scattered open areas)   Edges / Margins attached   Peri-wound Assessment erythemic  (rough dry skin  - yellow tint )   Drainage Amount Scant   Drainage Description Serous   Closure None   Treatments Cleansed   Dressing (Mepilex AG. double layer tubigrip)   Dressing Status Dressing Changed   Wounds and Other Ulcers 07/19/16  Leg Left;Posterior;Medial   Date First Assessed/Time First Assessed: 07/19/16 1347   Wound Type: (c)   Location: Leg  Wound Location Orientation: Left;Posterior;Medial  Pre-existing: No   State of Healing Healing   Wound Assessment Epithelizalization   Drainage Amount None   Closure None   Dressing (mepilex AG, double layer tubigrip)   Dressing Status Dressing Changed  Treatment/Rationale: Re-measured legs at this time. R leg 48.5 cm, Left leg 50 cm. Applied attractain cream to BLE to assist in removing dry, rough and flaky skin. 2 small open areas to anterior leg with minimal serous drainage noted. Mepilex AG applied to absorb drainage and provide antimicrobial properties to open areas. Applied double layer tubigrip (size G) to achieve moderate compression. Tolerating compression well. Leg measurements decreasing from previous visit. Tubigrip size F applied to right lower leg     Lift Assist: 1 assist    Education:    Who: patient      What reviewed: Wound Care , keeping legs elevated, and keeping tubigrips pulled down to toes    Pt understanding: verbalizes understanding  Coordination of Care with: patient    Impression: Wound are resolving. 2 small open areas an anterior left leg with minimal drainage. Would like to change size of tubigrip to left lower leg as  edema continues to decrease. This patient would benefit from having ABI study done. Redness to left lower leg decreased from yesterday 2/8 per floor nurse. Head to toe rash noted from levaquin reaction. Patient is receiving benadryl and hydrocortisone cream.    Recommendations:   Dressing changes to be done Mon,Wed,Fri and PRN by bedside nurse.  Wound Care team to follow-up 1/2 times a week.      Billable Items: 6x6 Mepilex AG

## 2016-07-27 NOTE — Progress Notes (Signed)
End of Shift Report   Significant Events/Was the physician notified?  Change in QRS complex (rabbit ears) suspected ST elevation, BBB. EKG obtained per order. Confirmed RBBB, ruled out ST elevation.  EKG obtained for change in tele strip- RBBB seems similar to prior EKG 07/12/16 (Aung).   Relevant Assessment Findings  VSS aside from SB BPs WNL.   Patient/Family Questions/Concerns  Rash   DC Barriers  Clinical improvement   Medical-Surgical    Adequate urine output?   If Foley in place state indication   yes       Is the patient eating/drinking adequately? yes   Date and time of last BM? Stool Occurrence: Yes (07/26/16 0902)       Drain in place? Output?    no   What is mobility status/PT recommendations/Fall risk?  Fall Risk: High     IV or Central Line?   Due dates for IV or CL?  If CL in place state indication                     Peripheral IV 07/24/16 Left Antecubital (Active)   Placement Date/Time: 07/24/16 0525   IV Change Due: 07/28/16  Size (Gauge): 20 G  Orientation: Left  Location: Antecubital  Twin Cath: No  Site Prep: Chlorhexidine   Assistive Devices Used: Ultrasound  Inserted by: ED RN  Total # of attempts: (c) 1  P...        Pain Control/Last dose given Pain Score: 0-No pain (07/26/16 2036)     Other Miscellaneous/Pertinent Info

## 2016-07-27 NOTE — Progress Notes (Signed)
Progress Note (Medicine)   Name: Evan [name]  DOB: [birth year] 71 y.o.  MRN: [id number]  CSN: [id number]  [name]    Background information   Evan [name] is a 71 y.o. male   LOS: 15 days    Chief complaint:  Asking about EKG findings from overnight. Improving rash of left lower extremity    History of Present Illness:  71 year old man with morbid obesity, hypertension, sleep apnea on CPAP, chronic kidney disease stage III, type 2 diabetes. Presents to the hospital complaining of increasing weakness and fever as well as a syncopal event. EMS found hypoxia and on arrival he had a lactic acidosis of 4. Serum creatinine was elevated at 1.8 and had a positive troponin of 0.11 that peaked at 7.7. He denied chest pain at rest. He complained of weakness and coughing as well as shortness of breath. A stress test showed a moderate probability for underlying COPD but cardiology recommended medical management until acute issues are resolved. An echo cardiogram showed a right heart strain but the patient has a history of obstructive sleep apnea and obesity. An ultrasound of the lower extremity did not show evidence of DVT. A CT pulmonary angiogram showed no evidence of pulmonary emboli. He subsequently developed contrast-induced nephropathy with serum creatinine peaked at 5.1.    Subjective:  He feels that his rash is improving. Denies chest pain or breath.    Assessment & Plan:     - Severe sepsis on admission secondary to cellulitis   Resolved.     - Left lower extremity cellulitis   Continues to improve and will continue with Ancef. Consider switching to oral therapy in the next 1-2 days.  Continue with wound care.   ?  - Leukocytosis   Persistently elevated secondary to steroid use.     - Syncope   Likely due to sepsis with hypotension. A CT of the chest did not show evidence of PE and an echocardiogram showed evidence of right heart strain but he does have evidence of morbid obesity with OSA.     -  Non-STEMI   Troponin peaked at 7.7 and a stress test showed moderate likelihood for CAD. Cardiology recommends medical management unless patient develops chest pain or other equivalent symptom. Once his kidney function is improved, he may consider further cardiac evaluation, likely as outpatient. Continue with aspirin, statin and beta blocker.    - Acute kidney injury on chronic disease stage III   Baseline creatinine is around 1.3. Suspected contrast-induced nephropathy. Continue with Lasix.    - Elevated MCV  TSH, B12 and folate are within normal range.  ?  - Insulin-dependent type 2 diabetes   Continue with insulin sliding scale. Has had elevated CBGs likely worsened by the use of corticosteroids. We will adjust therapy during inpatient.     - Edema of lower extremities   Likely secondary to a volume overload state with acute kidney injury and right-sided heart failure. Monitor clinically with strict ins and outs.    - Diffuse macular papular rash   Likely drug related from Levaquin. No evidence of severe disease and rash spares palms and mucosa. Continue with prednisone 40 mg daily day #4.   ?  - Hypertension   Normotensive. Lisinopril and hydrochlorothiazide on hold due to AKI.     - Obstructive sleep apnea on CPAP  ?  Disposition/Planning: Pending clinical improvement. Likely discharge home the next 1-2 days.    Objective:    Vitals Current  24 Hour Min / Max      Temp    36.5 ?C (97.7 ?F)    Temp  Min: 36.4 ?C (97.6 ?F)  Max: 37.4 ?C (99.3 ?F)      BP     130/66     BP  Min: 112/58  Max: 130/66      HR    57    Pulse  Min: 50  Max: 59      RR    17    Resp  Min: 14  Max: 18      Sats      SpO2: 94 % on  L/min       SpO2  Min: 94 %  Max: 98 %      Weight    (!) 172.8 kg (380 lb 14.4 oz)  Body mass index is 49.98 kg/m?[name]    Admit: (!) 181.4 kg (400 lb)   Intake/Ouput:    Intake/Output Summary (Last 24 hours) at 07/27/16 2030  Last data filed at 07/27/16 1416   Gross per 24 hour   Intake              365 ml      Output             2475 ml   Net            -2110 ml           Physical Exam:  Gen: No acute distress  Lungs: Clear to auscultation bilaterally  Heart: Regular rate and rhythm. No significant murmur.  Abdomen: Obese, soft without tenderness.  Extremities: 1+ pitting edema of lower extremity is  Skin: Improving erythema.  Neuro: No focal deficits  Psych: Stable mood and affect    Medications:  . aspirin  81 mg Oral Daily   . atorvastatin  20 mg Oral QPM   . ceFAZolin (ANCEF) IV  1 g Intravenous Q12H   . furosemide  60 mg Oral Daily   . heparin  5,000 Units Subcutaneous Q8H SCH   . hydrocortisone   Topical 2 times per day   . insulin aspart   Subcutaneous 4x Daily - AC and HS   . insulin aspart   Subcutaneous TID with meals   . insulin glargine  40 Units Subcutaneous Nightly   . metoprolol tartrate  150 mg Oral 2 times per day   . polyethylene glycol  17 g Oral Daily   . predniSONE  20 mg Oral 2 times per day   . senna-docusate  2 tablet Oral 2 times per day   . sodium chloride 0.9 % (NS) syringe  5-10 mL Intravenous Q8H SCH       PRN Medications:  acetaminophen, benzocaine-menthol, bisacodyl, dextrose, dextrose, dextrose, diphenhydrAMINE, diphenhydrAMINE, glucagon (human recombinant), HYDROcodone-acetaminophen, magnesium hydroxide, melatonin, nicotine polacrilex, phenol, polyvinyl alcohol, sodium chloride 0.9 % (NS) syringe, urea    Laboratories:  Results for orders placed or performed during the hospital encounter of 07/11/16 (from the past 24 hour(s))   POCT Glucose -Next Routine    Collection Time: 07/26/16  8:32 PM   Result Value Ref Range    POC Glucose 356 (H) 80 - 99 mg/dL   POCT Glucose -Next Routine    Collection Time: 07/27/16  5:35 AM   Result Value Ref Range    POC Glucose 326 (H) 80 - 99 mg/dL   CBC with Auto Differential -AM Draw    Collection Time: 07/27/16  6:10  AM   Result Value Ref Range    WBC 17.2 (H) 4.8 - 10.8 10*3/?L    RBC 3.22 (L) 4.70 - 6.10 10*6/?L    Hemoglobin 10.8 (L) 12.0 - 18.0 g/dL     HCT 04.5 (L) 40.9 - 54.0 %    MCV 100.6 (H) 81.0 - 99.0 fL    MCH 33.7 27.0 - 34.0 pg    MCHC 33.5 32.0 - 36.0 g/dL    RDW 81.1 91.4 - 78.2 %    Platelet Count 325 150 - 400 10*3/?L    MPV 9.3 7.4 - 10.4 fL    Neutrophils % 89 (H) 35 - 70 %    Lymphocytes % 5 (L) 25 - 45 %    Monocytes % 3 0 - 12 %    Eosinophils % 3 0 - 7 %    Basophils % 1 0 - 2 %    Neutrophils, Absolute 15.3 (H) 1.6 - 7.3 10*3/?L    Lymphocytes, Absolute 0.9 (L) 1.1 - 4.3 10*3/?L    Monocytes, Absolute 0.5 0.0 - 1.2 10*3/?L    Eosinophils, Absolute 0.4 0.0 - 0.7 10*3/?L    Basophils, Absolute 0.1 0.0 - 0.2 10*3/?L    Differential Type Automated Differential    Basic Metabolic Panel -AM Draw    Collection Time: 07/27/16  6:10 AM   Result Value Ref Range    Sodium 133 (L) 135 - 143 mmol/L    Potassium 5.5 (H) 3.5 - 5.1 mmol/L    Chloride 104 98 - 111 mmol/L    CO2 - Carbon Dioxide 18.5 (L) 21.0 - 31.0 mmol/L    Glucose 349 (H) 80 - 99 mg/dL    BUN 956 (H) 6 - 23 mg/dL    Creatinine 2.13 (H) 0.65 - 1.30 mg/dL    Calcium 8.9 8.6 - 08.6 mg/dL    Anion Gap 57.8 3.0 - 11.0 mmol/L    GFR Estimate 20 (L) >=60 mL/min/1.37m*2    GFR Additional Info     POCT Glucose -Next Routine    Collection Time: 07/27/16 12:03 PM   Result Value Ref Range    POC Glucose 377 (H) 80 - 99 mg/dL   POCT Glucose -Next Routine    Collection Time: 07/27/16  4:58 PM   Result Value Ref Range    POC Glucose 353 (H) 80 - 99 mg/dL       Active Hospital Problems    Diagnosis SNOMED CT(R) Date Noted   . Cellulitis CELLULITIS 07/12/2016   . Weakness generalized ASTHENIA 07/12/2016   . Cellulitis of left lower extremity CELLULITIS OF LEFT LOWER LIMB 07/12/2016   . Syncope and collapse SYNCOPE AND COLLAPSE 07/12/2016   . Fall, initial encounter FALL 07/12/2016   . Elevated troponin HIGH TROPONIN I LEVEL 07/12/2016   . Uncontrolled type 2 diabetes mellitus with stage 3 chronic kidney disease, with long-term current use of insulin (CMS/HCC) TYPE 2 DIABETES MELLITUS 07/12/2016   . COPD  exacerbation (CMS/HCC) ACUTE EXACERBATION OF CHRONIC OBSTRUCTIVE AIRWAYS DISEASE 07/12/2016   . Sepsis due to undetermined organism (CMS/HCC) SEPSIS 07/12/2016   . Fever FEVER 07/12/2016       30 minutes

## 2016-07-28 LAB — CBC WITH AUTO DIFFERENTIAL
Basophils %: 1 % (ref 0–2)
Basophils, Absolute: 0.1 10*3/ÂµL (ref 0.0–0.2)
Eosinophils %: 2 % (ref 0–7)
Eosinophils, Absolute: 0.4 10*3/ÂµL (ref 0.0–0.7)
HCT: 34.6 % — ABNORMAL LOW (ref 42.0–54.0)
Hemoglobin: 11.8 g/dL — ABNORMAL LOW (ref 12.0–18.0)
Lymphocytes %: 5 % — ABNORMAL LOW (ref 25–45)
Lymphocytes, Absolute: 0.9 10*3/ÂµL — ABNORMAL LOW (ref 1.1–4.3)
MCH: 34.4 pg — ABNORMAL HIGH (ref 27.0–34.0)
MCHC: 34.2 g/dL (ref 32.0–36.0)
MCV: 100.5 fL — ABNORMAL HIGH (ref 81.0–99.0)
MPV: 9.4 fL (ref 7.4–10.4)
Monocytes %: 4 % (ref 0–12)
Monocytes, Absolute: 0.7 10*3/ÂµL (ref 0.0–1.2)
Neutrophils %: 89 % — ABNORMAL HIGH (ref 35–70)
Neutrophils, Absolute: 16.5 10*3/ÂµL — ABNORMAL HIGH (ref 1.6–7.3)
Platelet Count: 313 10*3/ÂµL (ref 150–400)
RBC: 3.45 10*6/ÂµL — ABNORMAL LOW (ref 4.70–6.10)
RDW: 13.8 % (ref 11.5–14.5)
WBC: 18.6 10*3/ÂµL — ABNORMAL HIGH (ref 4.8–10.8)

## 2016-07-28 LAB — BASIC METABOLIC PANEL
Anion Gap: 10.6 mmol/L (ref 3.0–11.0)
BUN: 97 mg/dL — ABNORMAL HIGH (ref 6–23)
CO2 - Carbon Dioxide: 18.4 mmol/L — ABNORMAL LOW (ref 21.0–31.0)
Calcium: 9.4 mg/dL (ref 8.6–10.3)
Chloride: 105 mmol/L (ref 98–111)
Creatinine: 2.77 mg/dL — ABNORMAL HIGH (ref 0.65–1.30)
GFR Estimate: 23 mL/min/1.73m*2 — ABNORMAL LOW (ref >=60)
Glucose: 335 mg/dL — ABNORMAL HIGH (ref 80–99)
Potassium: 5.5 mmol/L — ABNORMAL HIGH (ref 3.5–5.1)
Sodium: 134 mmol/L — ABNORMAL LOW (ref 135–143)

## 2016-07-28 LAB — POCT GLUCOSE
POC Glucose: 353 mg/dL — ABNORMAL HIGH (ref 80–99)
POC Glucose: 382 mg/dL — ABNORMAL HIGH (ref 80–99)
POC Glucose: 327 mg/dL — ABNORMAL HIGH (ref 80–99)
POC Glucose: 333 mg/dL — ABNORMAL HIGH (ref 80–99)

## 2016-07-28 MED ORDER — insulin glargine (LANTUS) injection 45 Units
100 | Freq: Every evening | SUBCUTANEOUS | Status: DC
Start: 2016-07-28 — End: 2016-07-28
  Administered 2016-07-28: 05:00:00 100 [IU] via SUBCUTANEOUS

## 2016-07-28 MED ORDER — insulin aspart (NovoLOG) PEN
100 | Freq: Four times a day (QID) | SUBCUTANEOUS | Status: DC
Start: 2016-07-28 — End: 2016-07-28
  Administered 2016-07-28 (×3): 100 unit/mL (3 mL) via SUBCUTANEOUS

## 2016-07-28 MED FILL — SENNA PLUS 8.6 MG-50 MG TABLET: 8.6-50 mg | ORAL | Qty: 2

## 2016-07-28 MED FILL — PREDNISONE 20 MG TABLET: 20 mg | ORAL | Qty: 1

## 2016-07-28 MED FILL — LANTUS U-100 INSULIN 100 UNIT/ML SUBCUTANEOUS SOLUTION: 100 [IU]/mL | SUBCUTANEOUS | Qty: 0.4

## 2016-07-28 MED FILL — ATORVASTATIN 20 MG TABLET: 20 mg | ORAL | Qty: 1

## 2016-07-28 MED FILL — CEFAZOLIN 1 GRAM/50 ML IN DEXTROSE (ISO-OSMOTIC) INTRAVENOUS PIGGYBACK: 1 gram/50 mL | INTRAVENOUS | Qty: 50

## 2016-07-28 MED FILL — HEPARIN, PORCINE (PF) 5,000 UNIT/0.5 ML SYRINGE: 5000 unit/0.5 mL | INTRAMUSCULAR | Qty: 0.5

## 2016-07-28 MED FILL — ASPIRIN 81 MG TABLET,ENTERIC COATED: 81 mg | ORAL | Qty: 1

## 2016-07-28 MED FILL — METOPROLOL TARTRATE 50 MG TABLET: 50 mg | ORAL | Qty: 3

## 2016-07-28 MED FILL — LANTUS U-100 INSULIN 100 UNIT/ML SUBCUTANEOUS SOLUTION: 100 [IU]/mL | SUBCUTANEOUS | Qty: 0.45

## 2016-07-28 MED FILL — FUROSEMIDE 20 MG TABLET: 20 mg | ORAL | Qty: 1

## 2016-07-28 MED FILL — MIRALAX 17 GRAM ORAL POWDER PACKET: 17 g | ORAL | Qty: 1

## 2016-07-28 MED FILL — FUROSEMIDE 40 MG TABLET: 40 mg | ORAL | Qty: 1

## 2016-07-28 NOTE — Treatment Summary (Signed)
IP PT TREATMENT NOTE     Patient:  Evan [name] / [id number] DOB:  1946-01-27 / 71 y.o. Date:  07/28/2016     Precautions  Specific mobility precautions: None     ASSESSMENT     Summary:  Pt up in chair  Sit <==> stand SBA  Gait with FWW ~150', slow, needing a couple standing rests   Good effort  Back up in chair     Able to be up with Nursing Team (x 1).     Discharge recommendations  Mobility Equipment needs: Front wheeled walker (FWW)   Secondary recommendation: Patient would benefit from SNF/Nursing Home/Other Skilled Facility.  Recommended transport method: Wheelchair  Justification: pending progress, may be able to return home with Home Health PT, below baseline, using walker vs none at home      PLAN     Treatment Plan Frequency    Plan: Continue per established POC.  Pt. demonstrates limitations which require skilled PT intervention.   .  Frequency will be increased as pt's condition or discharge plan indicates.     Evan [name] will remain on the Physical Therapy schedule until goals are met, or there has been a change in the Plan of Care.          Treatment Diagnosis Surgery / # Days Post-op (if applicable)    Primary Dx: Sepsis, cellulitis  Treatment Dx: Impaired mobility   /         SUBJECTIVE   Pt up in chair, agrees to PT     Pain Level  Current status: Pain does not limit pt's ability to participate in therapy  Pain at start of session: 0 - No Pain  Pain at end of session: 0 - No Pain     OBJECTIVE   Strengthening, safe mobility     Mental status  General Demeanor: Pleasant;Cooperative  LOC: Alert  --> Follows multi-step commands: Consistently  Attention span: Appears intact  Memory: Appears intact in social/therapy situations     TREATMENT  Bed Mobility/Transfers  Bed mobility       Rolling / logroll        Transition to sitting up     To resting position      Transition to sitting down SBA     Transition to supine       Scooting     Transfers      Transition to standing From chair: SBA        Method        Gait/Stairs  Mobility - Gait (Level)  Gait (distance): 150 feet  Gait (assist level): CGA;SBA  Assistive device: Front wheeled walker (FWW)  Endurance: fair  Pace: slow  Pattern: Downward gaze;Guarded  Surface: Level tile    Ther. Ex.  Therapeutic Exercises  Ankle pumps: 10  Quad sets: 10  Seated long arc quads: 5   Training/Education      Safety/Room Set-up   Mobility in room per PT: Able to be up with Nursing Team (x 1).  Call button accessible?: Yes  Phone accessible?: Yes  Oxygen reconnected?: No, on room air  Position on arrival: In regular chair  Position on departure (upright): In regular chair     GOALS     Patient/Caregiver goals reviewed and integrated with rehab treatment plan:      Multidisciplinary Problems (Active)        Problem: IP General Goal List - 2    Goal Priority Disciplines  Outcome   Bed Mobility     PT    Description:  Pt. to perform bed mobility with min assist.    Transfers     PT    Description:  Pt. to perform all functional transfers with min. assist.    Ambulation - Levels     PT    Description:  Pt. to ambulate with min assist x 50 feet with appropriate assistive device.    Ambulation - Stairs     PT    Description:  Pt. to ascend/descend stairs with min assist, as per discharge environment.    Home Exercise Program     PT    Description:  Pt. to perform basic HEP with min assist.    Caregiver Training     PT    Description:  Patient/caregiver training will be provided, as needed to achieve goals.             Problem: Patient/Family Goal    Goal Priority Disciplines Outcome   Normal Status     PT    Description:  Pt. wants to return to previous level of function.     Home     PT    Description:  Pt. wants to return to previous living situation.    Mobility     PT    Description:  Patient wants to walk again.                      G-Codes/PSFS/FIM Scores: (if applicable)          First session   Start/Stop times: 1500 - 1532   Total time: 32 minutes      This note to  serve as a Discharge Summary if Evan [name] is discharged from the hospital or from therapy services.     Therapist:  Heloise Beecham, PTA

## 2016-07-28 NOTE — Progress Notes (Signed)
End of Shift Report     Significant Events Bradycardia. HR 45-60s. Pt asymptomatic. Up to chair X2.      Relevant   Assessment Findings Leg wound dressing clean, dry, intact. Tubigrip without wrinkles, Out of bed with one assist and FWW. Resp. Even and unlabored. Voiding without difficulty.       Patient/Family  Questions/Concerns Uncontrolled blood sugars with controlled diet     DC Barriers Uncontrolled blood sugars   General Medicine Patients       Is the patient voiding or having normal catheter output? yes      Is the patient eating/drinking adequately? yes      Last bowel movement?   07/28/16      What is the stability of patient?  fair      How is pain managed and controlled? (last dose) none       Calls made to patient care provider   none      New orders related to changes in patient condition?  Increased insulin coverage       Miscellaneous information from the shift    Dialysis Patients        When was last dialysis and were there any issues?       When is next dialysis planned any special instructions?

## 2016-07-28 NOTE — Progress Notes (Signed)
End of Shift Report     Significant Events Pt has had times during the night where his heart rate will drop briefly in the high 30s.  Pt not symptomatic during that time.  Baseline when sleeping HR runs in the 40s-50s.     Relevant   Assessment Findings Wounds to legs changed by wound nurse on Friday.  Pt is a one assist out of bed and uses the FWW.  On room air.  A&Ox4.      Patient/Family  Questions/Concerns      DC Barriers IV abx, uncontrolled blood sugar levels.   General Medicine Patients       Is the patient voiding or having normal catheter output? Yes, uses urinal      Is the patient eating/drinking adequately? yes      Last bowel movement?   07/26/16      What is the stability of patient?        How is pain managed and controlled? (last dose) None reported       Calls made to patient care provider         New orders related to changes in patient condition?         Miscellaneous information from the shift    Dialysis Patients        When was last dialysis and were there any issues?       When is next dialysis planned any special instructions?

## 2016-07-28 NOTE — Plan of Care (Signed)
Problem: Cardiovascular - Adult  Goal: Absence of cardiac dysrhythmias or at baseline  INTERVENTIONS:  1. Continuous cardiac monitoring, monitor vital signs, obtain 12 lead EKG if indicated  2. Administer antiarrhythmic and heart rate control medications as ordered  3. Initiate emergency measures for life threatening arrhythmias  4. Monitor electrolytes and administer replacement therapy as ordered   Outcome: Progressing  Telemetry monitoring showing NSR rate of 60's    Problem: Metabolic/Fluid and Electrolytes - Adult  Goal: Glucose maintained within prescribed range  INTERVENTIONS:  1. Monitor Blood Glucose as ordered  2. Assess for signs and symptoms of hyperglycemia and hypoglycemia  3. Administer ordered medications to maintain glucose within target range  4. Assess barriers to adequate nutritional intake and initiate nutrition consult as needed  5. Instruct patient on self management of diabetes and initiate consult as needed   Outcome: Progressing  Educated on signs and symptoms of hypo and hyperglycemia.  Carb coverage given for 5 gms carb/ one unit of insulin. Sliding scale coverage also given.  BS  Ranging in 300's

## 2016-07-28 NOTE — Progress Notes (Signed)
Progress Note (Medicine)   Name: Evan [name]  DOB: 20-Jun-1945 71 y.o.  MRN: [id number]  CSN: [id number]  [name]    Background information   Evan [name] is a 71 y.o. male   LOS: 16 days    Chief complaint:  Improving generalized rash    History of Present Illness:  71 year old man with morbid obesity, hypertension, sleep apnea on CPAP, chronic kidney disease stage III, type 2 diabetes. Presents to the hospital complaining of increasing weakness and fever as well as a syncopal event. EMS found hypoxia and on arrival he had a lactic acidosis of 4. Serum creatinine was elevated at 1.8 and had a positive troponin of 0.11 that peaked at 7.7. He denied chest pain at rest. He complained of weakness and coughing as well as shortness of breath. A stress test showed a moderate probability for underlying COPD but cardiology recommended medical management until acute issues are resolved. An echo cardiogram showed a right heart strain but the patient has a history of obstructive sleep apnea and obesity. An ultrasound of the lower extremity did not show evidence of DVT. A CT pulmonary angiogram showed no evidence of pulmonary emboli. He subsequently developed contrast-induced nephropathy with serum creatinine peaked at 5.1.    Subjective:  No chest pain, sugars of breath, fever or chills    Assessment & Plan:     - Left lower extremity cellulitis   Continues to improve and we'll continue with Ancef. Initially he received ceftriaxone and vancomycin for 2 days then followed by ceftriaxone and levofloxacin for one day followed by levofloxacin alone for the following 5 days. He then received ampicillin for one day followed by vancomycin for 2 days and thereafter he's received 6 days of cefazolin. I will consider that he has been adequately treated and we'll discontinue antibiotic therapy today.  ?  - Diffuse macular papular rash   Likely drug related from Levaquin. No evidence of severe disease and rash spares  palms and mucosa. Continue with prednisone 40 mg daily day #5 plan Will discontinue therapy today.    - Insulin-dependent type 2 diabetes   Continue with insulin sliding scale. Has had elevated CBGs likely worsened by the use of corticosteroids. Increased Lantus to 50 units and slight scale to very high dose regimen.    - Severe sepsis on admission secondary to cellulitis   Resolved.     - Leukocytosis   Persistently elevated secondary to steroid use.     - Syncope   Likely due to sepsis with hypotension. A CT of the chest did not show evidence of PE and an echocardiogram showed evidence of right heart strain but he does have evidence of morbid obesity with OSA.     - Non-STEMI   Troponin peaked at 7.7 and a stress test showed moderate likelihood for CAD. Cardiology recommends medical management unless patient develops chest pain or other equivalent symptom. Once his kidney function is improved, he may consider further cardiac evaluation, likely as outpatient. Continue with aspirin, statin and beta blocker.    - Acute kidney injury on chronic disease stage III   Baseline creatinine is around 1.3. Suspected contrast-induced nephropathy. Continue with Lasix.    - Elevated MCV  TSH, B12 and folate are within normal range.    - Edema of lower extremities   Likely secondary to a volume overload state with acute kidney injury and right-sided heart failure. Monitor clinically with strict ins and outs.    -  Hypertension   Normotensive. Lisinopril and hydrochlorothiazide on hold due to AKI.     - Obstructive sleep apnea on CPAP  ?  Disposition/Planning: physical therapy recommending skilled nursing facility.    Objective:    Vitals Current 24 Hour Min / Max      Temp    36.4 ?C (97.5 ?F)    Temp  Min: 36.4 ?C (97.5 ?F)  Max: 36.7 ?C (98.1 ?F)      BP     146/67     BP  Min: 128/78  Max: 146/67      HR    55    Pulse  Min: 50  Max: 71      RR    17    Resp  Min: 14  Max: 18      Sats      SpO2: 97 % on  L/min       SpO2  Min:  96 %  Max: 99 %      Weight    (!) 172.8 kg (380 lb 15.3 oz)  Body mass index is 49.99 kg/m?[name]    Admit: (!) 181.4 kg (400 lb)   Intake/Ouput:    Intake/Output Summary (Last 24 hours) at 07/28/16 1620  Last data filed at 07/28/16 1301   Gross per 24 hour   Intake             1230 ml   Output             1775 ml   Net             -545 ml           Physical Exam:  Gen: distress  Lungs: clear to auscultation bilaterally  Heart: regular rate and rhythm. No significant murmurs.  Abdomen: obese, soft without tenderness.  Extremities: 1+ pitting edema of lower extremity is.  Skin: improving maculopapular rash.  Neuro: no focal neurological deficits  Psych: stable mood and affect    Medications:  . aspirin  81 mg Oral Daily   . atorvastatin  20 mg Oral QPM   . ceFAZolin (ANCEF) IV  1 g Intravenous Q12H   . furosemide  60 mg Oral Daily   . heparin  5,000 Units Subcutaneous Q8H SCH   . hydrocortisone   Topical 2 times per day   . insulin aspart   Subcutaneous TID with meals   . insulin aspart   Subcutaneous 4x Daily - AC and HS   . insulin glargine  50 Units Subcutaneous Nightly   . metoprolol tartrate  150 mg Oral 2 times per day   . polyethylene glycol  17 g Oral Daily   . predniSONE  20 mg Oral 2 times per day   . senna-docusate  2 tablet Oral 2 times per day   . sodium chloride 0.9 % (NS) syringe  5-10 mL Intravenous Q8H SCH       PRN Medications:  acetaminophen, benzocaine-menthol, bisacodyl, dextrose, dextrose, dextrose, diphenhydrAMINE, diphenhydrAMINE, glucagon (human recombinant), HYDROcodone-acetaminophen, magnesium hydroxide, melatonin, nicotine polacrilex, phenol, polyvinyl alcohol, sodium chloride 0.9 % (NS) syringe, urea    Laboratories:  Results for orders placed or performed during the hospital encounter of 07/11/16 (from the past 24 hour(s))   POCT Glucose -Next Routine    Collection Time: 07/27/16  4:58 PM   Result Value Ref Range    POC Glucose 353 (H) 80 - 99 mg/dL   POCT Glucose -Next Routine  Collection  Time: 07/27/16  9:13 PM   Result Value Ref Range    POC Glucose 382 (H) 80 - 99 mg/dL   Basic Metabolic Panel -AM Draw    Collection Time: 07/28/16  4:52 AM   Result Value Ref Range    Sodium 134 (L) 135 - 143 mmol/L    Potassium 5.5 (H) 3.5 - 5.1 mmol/L    Chloride 105 98 - 111 mmol/L    CO2 - Carbon Dioxide 18.4 (L) 21.0 - 31.0 mmol/L    Glucose 335 (H) 80 - 99 mg/dL    BUN 97 (H) 6 - 23 mg/dL    Creatinine 6.57 (H) 0.65 - 1.30 mg/dL    Calcium 9.4 8.6 - 84.6 mg/dL    Anion Gap 96.2 3.0 - 11.0 mmol/L    GFR Estimate 23 (L) >=60 mL/min/1.63m*2    GFR Additional Info     CBC with Auto Differential -AM Draw    Collection Time: 07/28/16  4:52 AM   Result Value Ref Range    WBC 18.6 (H) 4.8 - 10.8 10*3/?L    RBC 3.45 (L) 4.70 - 6.10 10*6/?L    Hemoglobin 11.8 (L) 12.0 - 18.0 g/dL    HCT 95.2 (L) 84.1 - 54.0 %    MCV 100.5 (H) 81.0 - 99.0 fL    MCH 34.4 (H) 27.0 - 34.0 pg    MCHC 34.2 32.0 - 36.0 g/dL    RDW 32.4 40.1 - 02.7 %    Platelet Count 313 150 - 400 10*3/?L    MPV 9.4 7.4 - 10.4 fL    Neutrophils % 89 (H) 35 - 70 %    Lymphocytes % 5 (L) 25 - 45 %    Monocytes % 4 0 - 12 %    Eosinophils % 2 0 - 7 %    Basophils % 1 0 - 2 %    Neutrophils, Absolute 16.5 (H) 1.6 - 7.3 10*3/?L    Lymphocytes, Absolute 0.9 (L) 1.1 - 4.3 10*3/?L    Monocytes, Absolute 0.7 0.0 - 1.2 10*3/?L    Eosinophils, Absolute 0.4 0.0 - 0.7 10*3/?L    Basophils, Absolute 0.1 0.0 - 0.2 10*3/?L    Differential Type Automated Differential    POCT Glucose -Next Routine    Collection Time: 07/28/16  6:16 AM   Result Value Ref Range    POC Glucose 327 (H) 80 - 99 mg/dL   POCT Glucose -Next Routine    Collection Time: 07/28/16 11:26 AM   Result Value Ref Range    POC Glucose 333 (H) 80 - 99 mg/dL       Active Hospital Problems    Diagnosis SNOMED CT(R) Date Noted   . Cellulitis CELLULITIS 07/12/2016   . Weakness generalized ASTHENIA 07/12/2016   . Cellulitis of left lower extremity CELLULITIS OF LEFT LOWER LIMB 07/12/2016   . Syncope and collapse  SYNCOPE AND COLLAPSE 07/12/2016   . Fall, initial encounter FALL 07/12/2016   . Elevated troponin HIGH TROPONIN I LEVEL 07/12/2016   . Uncontrolled type 2 diabetes mellitus with stage 3 chronic kidney disease, with long-term current use of insulin (CMS/HCC) TYPE 2 DIABETES MELLITUS 07/12/2016   . COPD exacerbation (CMS/HCC) ACUTE EXACERBATION OF CHRONIC OBSTRUCTIVE AIRWAYS DISEASE 07/12/2016   . Sepsis due to undetermined organism (CMS/HCC) SEPSIS 07/12/2016   . Fever FEVER 07/12/2016       26 minutes

## 2016-07-29 LAB — CBC WITH AUTO DIFFERENTIAL
Basophils %: 1 % (ref 0–2)
Basophils, Absolute: 0.1 10*3/ÂµL (ref 0.0–0.2)
Eosinophils %: 5 % (ref 0–7)
Eosinophils, Absolute: 0.9 10*3/ÂµL — ABNORMAL HIGH (ref 0.0–0.7)
HCT: 36.3 % — ABNORMAL LOW (ref 42.0–54.0)
Hemoglobin: 11.7 g/dL — ABNORMAL LOW (ref 12.0–18.0)
Lymphocytes %: 6 % — ABNORMAL LOW (ref 25–45)
Lymphocytes, Absolute: 1.2 10*3/ÂµL (ref 1.1–4.3)
MCH: 32.8 pg (ref 27.0–34.0)
MCHC: 32.3 g/dL (ref 32.0–36.0)
MCV: 101.3 fL — ABNORMAL HIGH (ref 81.0–99.0)
MPV: 9.7 fL (ref 7.4–10.4)
Monocytes %: 4 % (ref 0–12)
Monocytes, Absolute: 0.8 10*3/ÂµL (ref 0.0–1.2)
Neutrophils %: 84 % — ABNORMAL HIGH (ref 35–70)
Neutrophils, Absolute: 15.6 10*3/ÂµL — ABNORMAL HIGH (ref 1.6–7.3)
Platelet Count: 273 10*3/ÂµL (ref 150–400)
RBC: 3.58 10*6/ÂµL — ABNORMAL LOW (ref 4.70–6.10)
RDW: 14.1 % (ref 11.5–14.5)
WBC: 18.6 10*3/ÂµL — ABNORMAL HIGH (ref 4.8–10.8)

## 2016-07-29 LAB — BASIC METABOLIC PANEL
Anion Gap: 10.8 mmol/L (ref 3.0–11.0)
BUN: 97 mg/dL — ABNORMAL HIGH (ref 6–23)
CO2 - Carbon Dioxide: 19.2 mmol/L — ABNORMAL LOW (ref 21.0–31.0)
Calcium: 9.4 mg/dL (ref 8.6–10.3)
Chloride: 105 mmol/L (ref 98–111)
Creatinine: 2.78 mg/dL — ABNORMAL HIGH (ref 0.65–1.30)
GFR Estimate: 23 mL/min/1.73m*2 — ABNORMAL LOW (ref >=60)
Glucose: 268 mg/dL — ABNORMAL HIGH (ref 80–99)
Potassium: 5.6 mmol/L — ABNORMAL HIGH (ref 3.5–5.1)
Sodium: 135 mmol/L (ref 135–143)

## 2016-07-29 LAB — POCT GLUCOSE
POC Glucose: 399 mg/dL — ABNORMAL HIGH (ref 80–99)
POC Glucose: 271 mg/dL — ABNORMAL HIGH (ref 80–99)
POC Glucose: 248 mg/dL — ABNORMAL HIGH (ref 80–99)
POC Glucose: 316 mg/dL — ABNORMAL HIGH (ref 80–99)

## 2016-07-29 MED ORDER — sodium polystyrene WITH SORBITOL (SPS) 15-20 gram/60 mL oral suspension 15 g
15-20 | Freq: Once | ORAL | Status: AC
Start: 2016-07-29 — End: 2016-07-29
  Administered 2016-07-30: 02:00:00 15-20 g via ORAL

## 2016-07-29 MED ORDER — insulin glargine (LANTUS) injection 50 Units
100 | Freq: Every evening | SUBCUTANEOUS | Status: DC
Start: 2016-07-29 — End: 2016-07-29
  Administered 2016-07-29: 06:00:00 100 [IU] via SUBCUTANEOUS

## 2016-07-29 MED ORDER — insulin glargine (LANTUS) injection 55 Units
100 | Freq: Every evening | SUBCUTANEOUS | Status: DC
Start: 2016-07-29 — End: 2016-07-30
  Administered 2016-07-30: 05:00:00 100 [IU] via SUBCUTANEOUS

## 2016-07-29 MED ORDER — insulin aspart (NovoLOG) PEN
100 | Freq: Four times a day (QID) | SUBCUTANEOUS | Status: DC
Start: 2016-07-29 — End: 2016-07-30
  Administered 2016-07-29 – 2016-07-30 (×8): 100 unit/mL (3 mL) via SUBCUTANEOUS

## 2016-07-29 MED FILL — DIPHENHYDRAMINE 25 MG CAPSULE: 25 mg | ORAL | Qty: 1

## 2016-07-29 MED FILL — ASPIRIN 81 MG TABLET,ENTERIC COATED: 81 mg | ORAL | Qty: 1

## 2016-07-29 MED FILL — CEFAZOLIN 1 GRAM/50 ML IN DEXTROSE (ISO-OSMOTIC) INTRAVENOUS PIGGYBACK: 1 gram/50 mL | INTRAVENOUS | Qty: 50

## 2016-07-29 MED FILL — SENNA PLUS 8.6 MG-50 MG TABLET: 8.6-50 mg | ORAL | Qty: 2

## 2016-07-29 MED FILL — LANTUS U-100 INSULIN 100 UNIT/ML SUBCUTANEOUS SOLUTION: 100 [IU]/mL | SUBCUTANEOUS | Qty: 0.5

## 2016-07-29 MED FILL — PREDNISONE 20 MG TABLET: 20 mg | ORAL | Qty: 1

## 2016-07-29 MED FILL — HEPARIN, PORCINE (PF) 5,000 UNIT/0.5 ML SYRINGE: 5000 unit/0.5 mL | INTRAMUSCULAR | Qty: 0.5

## 2016-07-29 MED FILL — FUROSEMIDE 20 MG TABLET: 20 mg | ORAL | Qty: 1

## 2016-07-29 MED FILL — MIRALAX 17 GRAM ORAL POWDER PACKET: 17 g | ORAL | Qty: 1

## 2016-07-29 MED FILL — METOPROLOL TARTRATE 50 MG TABLET: 50 mg | ORAL | Qty: 3

## 2016-07-29 MED FILL — ATORVASTATIN 20 MG TABLET: 20 mg | ORAL | Qty: 1

## 2016-07-29 MED FILL — DIPHENHYDRAMINE 50 MG/ML INJECTION SOLUTION: 50 mg/mL | INTRAMUSCULAR | Qty: 1

## 2016-07-29 MED FILL — FUROSEMIDE 40 MG TABLET: 40 mg | ORAL | Qty: 1

## 2016-07-29 NOTE — Progress Notes (Signed)
End of Shift Report     Significant Events Bradycardic when sleeping at rate as low as 39.Asymptomatic. CBG 271 and    Wearing CPAP at night..     Relevant   Assessment Findings Bilateral legs are wrapped with tubigrip. No wrinkling.      Patient/Family  Questions/Concerns Blood sugar control.     DC Barriers Elevated CBG   General Medicine Patients       Is the patient voiding or having normal catheter output? Yes, using urinal      Is the patient eating/drinking adequately? yes      Last bowel movement?   07/28/2016      What is the stability of patient?  improving      How is pain managed and controlled? (last dose) Denies need for pain medication at this time.       Calls made to patient care provider   none      New orders related to changes in patient condition?  N/A       Miscellaneous information from the shift None   Dialysis Patients        When was last dialysis and were there any issues? N/A      When is next dialysis planned any special instructions? N/A

## 2016-07-29 NOTE — Progress Notes (Signed)
Progress Note (Medicine)   Name: Evan [name]  DOB: [birth year] 71 y.o.  MRN: [id number]  CSN: [id number]  XBM:[id number] [name]    Background information   Evan [name] is a 71 y.o. male   LOS: 17 days    Chief complaint:  Rash is stable. Non-pruriginous.    History of Present Illness:  70 year old man with morbid obesity, hypertension, sleep apnea on CPAP, chronic kidney disease stage III, type 2 diabetes. Presents to the hospital complaining of increasing weakness and fever as well as a syncopal event. EMS found hypoxia and on arrival he had a lactic acidosis of 4. Serum creatinine was elevated at 1.8 and had a positive troponin of 0.11 that peaked at 7.7. He denied chest pain at rest. He complained of weakness and coughing as well as shortness of breath. A stress test showed a moderate probability for underlying COPD but cardiology recommended medical management until acute issues are resolved. An echo cardiogram showed a right heart strain but the patient has a history of obstructive sleep apnea and obesity. An ultrasound of the lower extremity did not show evidence of DVT. A CT pulmonary angiogram showed no evidence of pulmonary emboli. He subsequently developed contrast-induced nephropathy with serum creatinine peaked at 5.1.    For treatment of his left lower extremity cellulitis he initially received ceftriaxone and vancomycin for 2 days then followed by ceftriaxone and levofloxacin for one day followed by levofloxacin alone for the following 5 days. He then received ampicillin for one day followed by vancomycin for 2 days and thereafter he's received 6 days of cefazolin, and completed therapy on 2/10 for a total of 15 days of therapy.    Subjective:  Denies chest pain or shows of breath. Had a bradycardia episode as low as 44 bpm.    Assessment & Plan:     - Left lower extremity cellulitis   Completed antibiotic therapy.   ?  - Diffuse macular papular rash   Likely drug related from Antibiotic, likely  Levaquin. No evidence of severe disease and rash spares palms and mucosa. Completed prednisone 40 mg for 5 days.     - Insulin-dependent type 2 diabetes   Continue with insulin sliding scale. Has had elevated CBGs likely worsened by the use of corticosteroids. Increased Lantus to 55 units and continue sliding scale at very high dose regimen.    - Severe sepsis on admission secondary to cellulitis   Resolved.     - Leukocytosis   Persistently elevated secondary to steroid use.     - Syncope   Likely due to sepsis with hypotension. Resolved.     - Non-STEMI   Denies CP. Continue with aspirin, statin and beta blocker.    - Acute kidney injury on chronic disease stage III   Baseline creatinine is around 1.3. Suspected contrast-induced nephropathy. Continue with Lasix and consider decreasing dose.     - Elevated MCV  TSH, B12 and folate are within normal range.    - Edema of lower extremities   Likely secondary to a volume overload state with acute kidney injury and right-sided heart failure. Monitor clinically with strict ins and outs.    - Hypertension   Normotensive. Lisinopril and hydrochlorothiazide on hold due to AKI.     - Obstructive sleep apnea on CPAP  ?  Disposition/Planning: Skilled nursing facility at the time of discharge    Objective:    Vitals Current 24 Hour Min / Max  Temp    36.6 ?C (97.8 ?F)    Temp  Min: 36.4 ?C (97.5 ?F)  Max: 36.8 ?C (98.2 ?F)      BP     120/82     BP  Min: 120/82  Max: 146/67      HR    70    Pulse  Min: 44  Max: 70      RR    20    Resp  Min: 17  Max: 20      Sats      SpO2: 97 % on  L/min       SpO2  Min: 94 %  Max: 98 %      Weight    (!) 170.4 kg (375 lb 10.6 oz)  Body mass index is 49.29 kg/m?[name]    Admit: (!) 181.4 kg (400 lb)   Intake/Ouput:    Intake/Output Summary (Last 24 hours) at 07/29/16 1456  Last data filed at 07/29/16 1237   Gross per 24 hour   Intake             1450 ml   Output             2250 ml   Net             -800 ml           Physical Exam:  Gen: No  acute distress  Lungs: Clear to auscultation bilaterally  Heart: Regular rate and rhythm. S1 and S2.  Abdomen: Obese without tenderness.  Extremities: Trace edema of lower extremity.  Skin: Stable maculopapular rash.  Neuro: No focal deficits.  Psych: Stable mood and affect.    Medications:  . aspirin  81 mg Oral Daily   . atorvastatin  20 mg Oral QPM   . furosemide  60 mg Oral Daily   . heparin  5,000 Units Subcutaneous Q8H SCH   . hydrocortisone   Topical 2 times per day   . insulin aspart   Subcutaneous TID with meals   . insulin aspart   Subcutaneous 4x Daily - AC and HS   . insulin glargine  55 Units Subcutaneous Nightly   . metoprolol tartrate  150 mg Oral 2 times per day   . polyethylene glycol  17 g Oral Daily   . senna-docusate  2 tablet Oral 2 times per day   . sodium chloride 0.9 % (NS) syringe  5-10 mL Intravenous Q8H SCH   . sodium polystyrene WITH SORBITOL  15 g Oral Once       PRN Medications:  acetaminophen, benzocaine-menthol, bisacodyl, dextrose, dextrose, dextrose, diphenhydrAMINE, diphenhydrAMINE, glucagon (human recombinant), HYDROcodone-acetaminophen, magnesium hydroxide, melatonin, nicotine polacrilex, phenol, polyvinyl alcohol, sodium chloride 0.9 % (NS) syringe, urea    Laboratories:  Results for orders placed or performed during the hospital encounter of 07/11/16 (from the past 24 hour(s))   POCT Glucose -Next Routine    Collection Time: 07/28/16  4:46 PM   Result Value Ref Range    POC Glucose 399 (H) 80 - 99 mg/dL   POCT Glucose -Next Routine    Collection Time: 07/28/16 10:09 PM   Result Value Ref Range    POC Glucose 271 (H) 80 - 99 mg/dL   Basic Metabolic Panel -AM Draw    Collection Time: 07/29/16  4:56 AM   Result Value Ref Range    Sodium 135 135 - 143 mmol/L    Potassium 5.6 (H) 3.5 - 5.1 mmol/L  Chloride 105 98 - 111 mmol/L    CO2 - Carbon Dioxide 19.2 (L) 21.0 - 31.0 mmol/L    Glucose 268 (H) 80 - 99 mg/dL    BUN 97 (H) 6 - 23 mg/dL    Creatinine 5.95 (H) 0.65 - 1.30 mg/dL     Calcium 9.4 8.6 - 63.8 mg/dL    Anion Gap 75.6 3.0 - 11.0 mmol/L    GFR Estimate 23 (L) >=60 mL/min/1.42m*2    GFR Additional Info     CBC with Auto Differential -AM Draw    Collection Time: 07/29/16  4:56 AM   Result Value Ref Range    WBC 18.6 (H) 4.8 - 10.8 10*3/?L    RBC 3.58 (L) 4.70 - 6.10 10*6/?L    Hemoglobin 11.7 (L) 12.0 - 18.0 g/dL    HCT 43.3 (L) 29.5 - 54.0 %    MCV 101.3 (H) 81.0 - 99.0 fL    MCH 32.8 27.0 - 34.0 pg    MCHC 32.3 32.0 - 36.0 g/dL    RDW 18.8 41.6 - 60.6 %    Platelet Count 273 150 - 400 10*3/?L    MPV 9.7 7.4 - 10.4 fL    Neutrophils % 84 (H) 35 - 70 %    Lymphocytes % 6 (L) 25 - 45 %    Monocytes % 4 0 - 12 %    Eosinophils % 5 0 - 7 %    Basophils % 1 0 - 2 %    Neutrophils, Absolute 15.6 (H) 1.6 - 7.3 10*3/?L    Lymphocytes, Absolute 1.2 1.1 - 4.3 10*3/?L    Monocytes, Absolute 0.8 0.0 - 1.2 10*3/?L    Eosinophils, Absolute 0.9 (H) 0.0 - 0.7 10*3/?L    Basophils, Absolute 0.1 0.0 - 0.2 10*3/?L    Differential Type Automated Differential    POCT Glucose -Next Routine    Collection Time: 07/29/16  6:27 AM   Result Value Ref Range    POC Glucose 248 (H) 80 - 99 mg/dL   POCT Glucose -Next Routine    Collection Time: 07/29/16 12:18 PM   Result Value Ref Range    POC Glucose 316 (H) 80 - 99 mg/dL       Active Hospital Problems    Diagnosis SNOMED CT(R) Date Noted   . Cellulitis CELLULITIS 07/12/2016   . Weakness generalized ASTHENIA 07/12/2016   . Cellulitis of left lower extremity CELLULITIS OF LEFT LOWER LIMB 07/12/2016   . Syncope and collapse SYNCOPE AND COLLAPSE 07/12/2016   . Fall, initial encounter FALL 07/12/2016   . Elevated troponin HIGH TROPONIN I LEVEL 07/12/2016   . Uncontrolled type 2 diabetes mellitus with stage 3 chronic kidney disease, with long-term current use of insulin (CMS/HCC) TYPE 2 DIABETES MELLITUS 07/12/2016   . COPD exacerbation (CMS/HCC) ACUTE EXACERBATION OF CHRONIC OBSTRUCTIVE AIRWAYS DISEASE 07/12/2016   . Sepsis due to undetermined organism (CMS/HCC) SEPSIS  07/12/2016   . Fever FEVER 07/12/2016       25 minutes

## 2016-07-29 NOTE — Progress Notes (Signed)
End of Shift Report     Significant Events Blood glucose remains high.  K also high.  Kayexolate given     Relevant   Assessment Findings Abnormal labs      Patient/Family  Questions/Concerns      DC Barriers Metabolic stability   General Medicine Patients       Is the patient voiding or having normal catheter output? yes      Is the patient eating/drinking adequately? yes      Last bowel movement?         What is the stability of patient?  Stable except for labs      How is pain managed and controlled? (last dose) 0 pain       Calls made to patient care provider   0      New orders related to changes in patient condition?         Miscellaneous information from the shift    Dialysis Patients        When was last dialysis and were there any issues?       When is next dialysis planned any special instructions?

## 2016-07-29 NOTE — Treatment Summary (Signed)
IP PT TREATMENT NOTE     Patient:  Evan [name] / [id number] DOB:  10-01-1945 / 71 y.o. Date:  07/29/2016     Precautions  Specific mobility precautions: None     ASSESSMENT     Summary:  Sit <==> stand SBA, Gait slowly with FWW ~180', endurance slowly improving  Pt wanting BTB,  Review of LE Ex  Good effort     Able to be up with Nursing Team (x 1).     Discharge recommendations  Mobility Equipment needs: Front wheeled walker (FWW)   Secondary recommendation: Patient would benefit from SNF/Nursing Home/Other Skilled Facility.  Recommended transport method: Wheelchair  Justification: pending progress, may be able to return home with Home Health PT, below baseline, using walker vs none at home      PLAN     Treatment Plan Frequency    Plan: Continue per established POC.  Pt. demonstrates limitations which require skilled PT intervention.   .  Frequency will be increased as pt's condition or discharge plan indicates.     Evan [name] will remain on the Physical Therapy schedule until goals are met, or there has been a change in the Plan of Care.          Treatment Diagnosis Surgery / # Days Post-op (if applicable)    Primary Dx: Sepsis, cellulitis  Treatment Dx: Impaired mobility   /         SUBJECTIVE   Pt up in chair, finished breakfast, agrees to PT     Pain Level  Current status: Pain does not limit pt's ability to participate in therapy  Pain at start of session: 0 - No Pain  Pain at end of session: 0 - No Pain     OBJECTIVE   Strengthening, safe mobility, gaining endurance     Mental status  General Demeanor: Pleasant;Cooperative  LOC: Alert  --> Follows multi-step commands: Consistently  Attention span: Appears intact  Memory: Appears intact in social/therapy situations     TREATMENT  Bed Mobility/Transfers  Bed mobility       Rolling / logroll Rolling R: SBA (using bed rails)      Transition to sitting up     To resting position      Transition to sitting down SBA;Verbal cues     Transition to  supine SBA (using bed rails)     Scooting     Transfers      Transition to standing From chair: SBA     Method        Gait/Stairs  Mobility - Gait (Level)  Gait (distance): 180 feet  Gait (assist level): CGA;SBA (couple standing rests)  Assistive device: Front wheeled walker (FWW)  Endurance: fair and improving  Pace: slow  Pattern: Downward gaze;Decreased step length L;Decreased step length R  Surface: Level tile    Ther. Ex.  Therapeutic Exercises  Ankle pumps: 10  Quad sets: 10  Gluteal sets: 10   Training/Education      Safety/Room Set-up   Mobility in room per PT: Able to be up with Nursing Team (x 1).  Call button accessible?: Yes  Phone accessible?: Yes  Oxygen reconnected?: No, on room air  Position on arrival: In regular chair  Position on departure (bed): Bed in lowest position;Mattress inflated     GOALS     Patient/Caregiver goals reviewed and integrated with rehab treatment plan:      Multidisciplinary Problems (Active)  Problem: IP General Goal List - 2    Goal Priority Disciplines Outcome   Bed Mobility     PT    Description:  Pt. to perform bed mobility with min assist.    Transfers     PT    Description:  Pt. to perform all functional transfers with min. assist.    Ambulation - Levels     PT    Description:  Pt. to ambulate with min assist x 50 feet with appropriate assistive device.    Ambulation - Stairs     PT    Description:  Pt. to ascend/descend stairs with min assist, as per discharge environment.    Home Exercise Program     PT    Description:  Pt. to perform basic HEP with min assist.    Caregiver Training     PT    Description:  Patient/caregiver training will be provided, as needed to achieve goals.             Problem: Patient/Family Goal    Goal Priority Disciplines Outcome   Normal Status     PT    Description:  Pt. wants to return to previous level of function.     Home     PT    Description:  Pt. wants to return to previous living situation.    Mobility     PT    Description:   Patient wants to walk again.                      G-Codes/PSFS/FIM Scores: (if applicable)          First session   Start/Stop times: 0815 - 0845   Total time: 30 minutes      This note to serve as a Discharge Summary if Evan [name] is discharged from the hospital or from therapy services.     Therapist:  Heloise Beecham, PTA

## 2016-07-30 LAB — BASIC METABOLIC PANEL
Anion Gap: 11 mmol/L (ref 3.0–11.0)
BUN: 101 mg/dL — ABNORMAL HIGH (ref 6–23)
CO2 - Carbon Dioxide: 19 mmol/L — ABNORMAL LOW (ref 21.0–31.0)
Calcium: 9 mg/dL (ref 8.6–10.3)
Chloride: 107 mmol/L (ref 98–111)
Creatinine: 2.69 mg/dL — ABNORMAL HIGH (ref 0.65–1.30)
GFR Estimate: 24 mL/min/1.73m*2 — ABNORMAL LOW (ref >=60)
Glucose: 232 mg/dL — ABNORMAL HIGH (ref 80–99)
Potassium: 4.7 mmol/L (ref 3.5–5.1)
Sodium: 137 mmol/L (ref 135–143)

## 2016-07-30 LAB — CBC WITH AUTO DIFFERENTIAL
Basophils %: 2 % (ref 0–2)
Basophils, Absolute: 0.3 10*3/ÂµL — ABNORMAL HIGH (ref 0.0–0.2)
Eosinophils %: 6 % (ref 0–7)
Eosinophils, Absolute: 0.9 10*3/ÂµL — ABNORMAL HIGH (ref 0.0–0.7)
HCT: 34.2 % — ABNORMAL LOW (ref 42.0–54.0)
Hemoglobin: 11.3 g/dL — ABNORMAL LOW (ref 12.0–18.0)
Lymphocytes %: 9 % — ABNORMAL LOW (ref 25–45)
Lymphocytes, Absolute: 1.4 10*3/ÂµL (ref 1.1–4.3)
MCH: 33.4 pg (ref 27.0–34.0)
MCHC: 33.1 g/dL (ref 32.0–36.0)
MCV: 100.7 fL — ABNORMAL HIGH (ref 81.0–99.0)
MPV: 9.8 fL (ref 7.4–10.4)
Monocytes %: 9 % (ref 0–12)
Monocytes, Absolute: 1.4 10*3/ÂµL — ABNORMAL HIGH (ref 0.0–1.2)
Neutrophils %: 74 % — ABNORMAL HIGH (ref 35–70)
Neutrophils, Absolute: 11.6 10*3/ÂµL — ABNORMAL HIGH (ref 1.6–7.3)
Platelet Count: 264 10*3/ÂµL (ref 150–400)
Platelet Estimate: NORMAL
RBC: 3.4 10*6/ÂµL — ABNORMAL LOW (ref 4.70–6.10)
RDW: 14 % (ref 11.5–14.5)
WBC: 15.6 10*3/ÂµL — ABNORMAL HIGH (ref 4.8–10.8)

## 2016-07-30 LAB — POCT GLUCOSE
POC Glucose: 265 mg/dL — ABNORMAL HIGH (ref 80–99)
POC Glucose: 318 mg/dL — ABNORMAL HIGH (ref 80–99)
POC Glucose: 252 mg/dL — ABNORMAL HIGH (ref 80–99)
POC Glucose: 273 mg/dL — ABNORMAL HIGH (ref 80–99)

## 2016-07-30 MED ORDER — insulin aspart (NovoLOG) PEN
100 | Freq: Four times a day (QID) | SUBCUTANEOUS | Status: DC
Start: 2016-07-30 — End: 2016-08-02
  Administered 2016-07-31 – 2016-08-02 (×10): 100 unit/mL (3 mL) via SUBCUTANEOUS

## 2016-07-30 MED ORDER — insulin aspart (NovoLOG) PEN 20 Units
100 | Freq: Three times a day (TID) | SUBCUTANEOUS | Status: DC
Start: 2016-07-30 — End: 2016-08-02
  Administered 2016-07-31 – 2016-08-02 (×7): 100 [IU] via SUBCUTANEOUS

## 2016-07-30 MED ORDER — metoprolol tartrate (LOPRESSOR) tablet 100 mg
50 | Freq: Two times a day (BID) | ORAL | Status: DC
Start: 2016-07-30 — End: 2016-08-02
  Administered 2016-07-31 – 2016-08-02 (×6): 50 mg via ORAL

## 2016-07-30 MED ORDER — insulin glargine (LANTUS) injection 25 Units
100 | Freq: Two times a day (BID) | SUBCUTANEOUS | Status: DC
Start: 2016-07-30 — End: 2016-08-02
  Administered 2016-07-31 – 2016-08-02 (×6): 100 [IU] via SUBCUTANEOUS

## 2016-07-30 MED FILL — LANTUS U-100 INSULIN 100 UNIT/ML SUBCUTANEOUS SOLUTION: 100 [IU]/mL | SUBCUTANEOUS | Qty: 0.55

## 2016-07-30 MED FILL — NOVOLOG FLEXPEN U-100 INSULIN ASPART 100 UNIT/ML (3 ML) SUBCUTANEOUS: 100 unit/mL (3 mL) | SUBCUTANEOUS | Qty: 3

## 2016-07-30 MED FILL — MELATONIN 3 MG TABLET: 3 mg | ORAL | Qty: 1

## 2016-07-30 MED FILL — DIPHENHYDRAMINE 25 MG CAPSULE: 25 mg | ORAL | Qty: 1

## 2016-07-30 MED FILL — MIRALAX 17 GRAM ORAL POWDER PACKET: 17 g | ORAL | Qty: 1

## 2016-07-30 MED FILL — FUROSEMIDE 40 MG TABLET: 40 mg | ORAL | Qty: 1

## 2016-07-30 MED FILL — ASPIRIN 81 MG TABLET,ENTERIC COATED: 81 mg | ORAL | Qty: 1

## 2016-07-30 MED FILL — METOPROLOL TARTRATE 50 MG TABLET: 50 mg | ORAL | Qty: 3

## 2016-07-30 MED FILL — HYDROCORTISONE 1 % TOPICAL CREAM: 1 % | TOPICAL | Qty: 28

## 2016-07-30 MED FILL — ATORVASTATIN 20 MG TABLET: 20 mg | ORAL | Qty: 1

## 2016-07-30 MED FILL — HEPARIN, PORCINE (PF) 5,000 UNIT/0.5 ML SYRINGE: 5000 unit/0.5 mL | INTRAMUSCULAR | Qty: 0.5

## 2016-07-30 MED FILL — SENNA PLUS 8.6 MG-50 MG TABLET: 8.6-50 mg | ORAL | Qty: 1

## 2016-07-30 MED FILL — SENNA PLUS 8.6 MG-50 MG TABLET: 8.6-50 mg | ORAL | Qty: 2

## 2016-07-30 MED FILL — FUROSEMIDE 20 MG TABLET: 20 mg | ORAL | Qty: 1

## 2016-07-30 MED FILL — SPS (WITH SORBITOL) 15 GRAM-20 GRAM/60 ML ORAL SUSPENSION: 15-20 gram/60 mL | ORAL | Qty: 60

## 2016-07-30 NOTE — Progress Notes (Signed)
End of Shift Report     Significant Events First CBG 318; then 252   . Coverage given, as ordered. Potassium this morning of 4.7   . Patient up in chair in night.     Relevant   Assessment Findings Rash improving.  Bilateraly lower extremities wrapped and intact.       Patient/Family  Questions/Concerns None voiced     DC Barriers Clinical improvement. Regulated blood sugar levels.   General Medicine Patients       Is the patient voiding or having normal catheter output? yes      Is the patient eating/drinking adequately? yes      Last bowel movement?   07/29/2016      What is the stability of patient?  CBGs elevated      How is pain managed and controlled? (last dose) Norco- none requested; benadryl given for itching- last dose at 0138       Calls made to patient care provider   none      New orders related to changes in patient condition?  n/a       Miscellaneous information from the shift none   Dialysis Patients        When was last dialysis and were there any issues? n/a      When is next dialysis planned any special instructions? n/a

## 2016-07-30 NOTE — Treatment Summary (Signed)
IP PT TREATMENT NOTE     Patient:  Evan [name] / [id number] DOB:  Dec 02, 1945 / 71 y.o. Date:  07/30/2016     Precautions  Specific mobility precautions: None     ASSESSMENT     Summary:  The patient tolerated PT session well. He demonstrates moderate bilateral LE weakness (L>R) requiring 3 brief standing rest breaks to complete 180 feet of ambulation. Mild LE tremor noted due to onset of muscle fatigue. Limited overall activity endurance. He would benefit from SNF vs [facility] PT from a mobility standpoint to progress functional mobility independence.      Able to be up with Nursing Team (x 1).     Activity tolerance: Tolerates 30 min activity with multiple rests     Precaution Awareness: No specific precautions  Deficit Awareness: Fully aware of deficits  Correction of Errors: Self-corrects with cues  Safety Judgment: Good awareness of safety     Consult recommendations      Discharge recommendations  Mobility Equipment needs: Front wheeled walker (FWW)   Primary recommendation : Patient would benefit from SNF/Nursing Home/Other Skilled Facility.  Recommended transport method: Wheelchair  Secondary recommendation: Patient would benefit from Home Health PT.  Justification: pending progress, may be able to return home with Home Health PT, below baseline, using walker vs none at home      PLAN     Treatment Plan Frequency    Plan: Continue per established POC.  Pt. demonstrates limitations which require skilled PT intervention.  1x (5 days/week).  Frequency will be increased as pt's condition or discharge plan indicates.     Jasier Hilton Sinclair will remain on the Physical Therapy schedule until goals are met, or there has been a change in the Plan of Care.          Treatment Diagnosis Surgery / # Days Post-op (if applicable)    Primary Dx: Sepsis, cellulitis  Treatment Dx: Impaired mobility   /         SUBJECTIVE   The patient is agreeable to therapy. Complains of feeling shaky and leg weakness with ambulation > 100  feet.      Pain Level  Current status: Pain does not limit pt's ability to participate in therapy  Pain at start of session: 0 - No Pain  Pain at end of session: 0 - No Pain     OBJECTIVE        Mental status  General Demeanor: Pleasant;Cooperative  LOC: Alert  Orientation: Oriented x 4  Directions: Follows multi-step commands  --> Follows multi-step commands: Consistently  Attention span: Appears intact  Memory: Appears intact in social/therapy situations   Medical appliances  Medical Appliances: Telemetry     TREATMENT  Bed Mobility/Transfers:   Bed mobility       Rolling / logroll  Rolling R: SBA      Transition to sitting up Sidelying to Sit: SBA   To resting position      Transition to sitting down SBA     Transition to supine       Scooting     Transfers      Transition to standing From bed: SBA     Method     Gait/Stairs  Mobility - Gait (Level)  Gait (distance): 180 feet  Gait (assist level): CGA;SBA  Assistive device: Front wheeled walker (FWW)  Endurance: improving  Pace: slow  Pattern: Downward gaze;Shuffling;Wide BOS  Surface: Level tile    Ther. Ex.  LE exercises: Ankle pumps;Heelslides;Quad sets  Ankle pumps: 10  Heelslides: 10  Quad sets: 10   Safety/Room Set-up   Mobility in room per PT: Able to be up with Nursing Team (x 1).  Call button accessible?: Yes  Phone accessible?: Yes  Oxygen reconnected?: No, on room air  Position on arrival: In bed  Position on departure (upright): Chair alarm on;In recliner chair     GOALS     Patient/Caregiver goals reviewed and integrated with rehab treatment plan:      Multidisciplinary Problems (Active)        Problem: IP General Goal List - 2    Goal Priority Disciplines Outcome   Bed Mobility     PT    Description:  Pt. to perform bed mobility with min assist.    Transfers     PT    Description:  Pt. to perform all functional transfers with min. assist.    Ambulation - Levels     PT    Description:  Pt. to ambulate with min assist x 50 feet with appropriate  assistive device.    Ambulation - Stairs     PT    Description:  Pt. to ascend/descend stairs with min assist, as per discharge environment.    Home Exercise Program     PT    Description:  Pt. to perform basic HEP with min assist.    Caregiver Training     PT    Description:  Patient/caregiver training will be provided, as needed to achieve goals.             Problem: Patient/Family Goal    Goal Priority Disciplines Outcome   Normal Status     PT    Description:  Pt. wants to return to previous level of function.     Home     PT    Description:  Pt. wants to return to previous living situation.    Mobility     PT    Description:  Patient wants to walk again.                          First session Second session (if applicable)    Start/Stop times: 0845 - 0926 Start/Stop times:   - Stop Time:     Total time: 41 minutes  Total time:   minutes     This note to serve as a Discharge Summary if Onofrio Klemp is discharged from the hospital or from therapy services.     Therapist:  [place] Ulyess Muto, PT

## 2016-07-30 NOTE — Progress Notes (Signed)
Diabetes Support Inpatient: Chart reviewed for follow-up.    I spoke with [name] today as he was lying in bed. He was welcoming to talk.    I asked how he was doing and he discussed his high blood sugars, that doctors have discontinued his steroid therapy, he hopes this will help with his hyperglycemia I discussed that it should help to bring them down as steroids cause a heightened glycemic response to carbohydrates but it may take a few days to see the full effects.     He stated that they are trying to get his SCr down to 2, and they are doing that by stopping some of his medications and watching for the effects on his kidneys.    [name] states that he doesn't really know what is going on or how it happens and does not appear to be concerned with his health issues, as if they are out of his control.    I asked and encouraged if he would like to attend outpatient diabetes education, possibly see an Charity fundraiser and RD for some one on one guidance. He is amenable to this following discharge from [facility]. Referrals are in for both and [facility].    He plans on admitting to [facility] upon discharge he and is hoping to go in 2-3 days.     His concerns about his insulin regimen are when and how much to take, he feels that he understands how to administer his own insulin and states that he gave it to himself for three days prior to admission. I discussed that he may be discharged here on a particular insulin regimen but that it may be changed by the doctor at [facility] before he is discharged from there. I encouraged him to ask the RN's to let him give himself his own insulin injections and to do his CBG's so that he can get experience and feel more comfortable before he goes home, I encouraged him to participate at [facility] as well. He has also been giving his dog insulin for 2-3 years and has this experience of drawing up insulin in a vial and syringe then administering.

## 2016-07-30 NOTE — Progress Notes (Signed)
Progress Note (Medicine)   Name: Evan [name]  DOB: August 22, 1945 71 y.o.  MRN: [id number]  CSN: [id number]  Evan [name]    Background information   Evan [name] is a 71 y.o. male   LOS: Evan days    Chief complaint:  Rash is stable. Complains of weakness.     History of Present Illness:  Evan [name] with morbid obesity, hypertension, sleep apnea on CPAP, chronic kidney disease stage III, type 2 diabetes. Presents to the hospital complaining of increasing weakness and fever as well as a syncopal event. EMS found hypoxia and on arrival he had a lactic acidosis of 4. Serum creatinine was elevated at 1.8 and had a positive troponin of 0.11 that peaked at 7.7. He denied chest pain at rest. He complained of weakness and coughing as well as shortness of breath. A stress test showed a moderate probability for underlying COPD but cardiology recommended medical management until acute issues are resolved. An echo cardiogram showed a right heart strain but the patient has a history of obstructive sleep apnea and obesity. An ultrasound of the lower extremity did not show evidence of DVT. A CT pulmonary angiogram showed no evidence of pulmonary emboli. He subsequently developed contrast-induced nephropathy with serum creatinine peaked at 5.1.    For treatment of his left lower extremity cellulitis he initially received ceftriaxone and vancomycin for 2 days then followed by ceftriaxone and levofloxacin for one day followed by levofloxacin alone for the following 5 days. He then received ampicillin for one day followed by vancomycin for 2 days and thereafter he's received 6 days of cefazolin, and completed therapy on 2/10 for a total of 15 days of therapy.    Subjective:  Denies chest pain or shortness of breath. Had bradycardia to 44 overnight.     Assessment & Plan:     - Left lower extremity cellulitis   Has completed antibiotic therapy.   ?  - Diffuse macular papular rash   Likely drug related from Antibiotic,  likely Levaquin. No evidence of severe disease and rash spares palms and mucosa. Completed prednisone 40 mg for 5 days.     - Insulin-dependent type 2 diabetes   Continue with insulin sliding scale. Modified Insulin per diabetic nurse recommendations.     - Severe sepsis on admission secondary to cellulitis   Resolved.     - Leukocytosis   Improving. Elevated secondary to steroid use and rash.     - Syncope   Likely due to sepsis with hypotension. Resolved.     - Non-STEMI   Denies CP. Continue with aspirin, statin and beta blocker.    - Acute kidney injury on chronic disease stage III   Baseline creatinine is around 1.3 and currently 1.69. Suspected contrast-induced nephropathy. Continue with Lasix and consider decreasing dose if renal function worsens.     - Elevated MCV  TSH, B12 and folate are within normal range.    - Edema of lower extremities   Likely secondary to a volume overload state with acute kidney injury and right-sided heart failure. I/Os    - Hypertension   Normotensive. Lisinopril and hydrochlorothiazide on hold due to AKI.     - Obstructive sleep apnea on CPAP  ?  Disposition/Planning: SNF on discharge.     Objective:    Vitals Current 24 Hour Min / Max      Temp    36.4 ?C (97.5 ?F)    Temp  Min: 36.4 ?C (97.5 ?  F)  Max: 36.6 ?C (97.9 ?F)      BP     134/82     BP  Min: 118/68  Max: 134/82      HR    67    Pulse  Min: 50  Max: 68      RR    17    Resp  Min: 14  Max: 20      Sats      SpO2: 94 % on  L/min       SpO2  Min: 94 %  Max: 98 %      Weight    (!) 170.4 kg (375 lb 10.6 oz)  Body mass index is 49.29 kg/m?[name]    Admit: (!) 181.4 kg (400 lb)   Intake/Ouput:    Intake/Output Summary (Last 24 hours) at 02/12/Evan 1547  Last data filed at 02/12/Evan 1300   Gross per 24 hour   Intake             1522 ml   Output             3225 ml   Net            -1703 ml           Physical Exam:  Gen: No acute distress  Lungs: Clear to auscultation bilaterally  Heart: Regular rate and rhythm.  Abdomen: Obese without  tenderness. Bowel sounds present  Extremities: Trace edema of lower extremities  Skin: Stable maculopapular rash  Neuro: No focal neurological deficits  Psych: Stable mood and affect    Medications:  . aspirin  81 mg Oral Daily   . atorvastatin  20 mg Oral QPM   . furosemide  60 mg Oral Daily   . heparin  5,000 Units Subcutaneous Q8H SCH   . hydrocortisone   Topical 2 times per day   . insulin aspart  20 Units Subcutaneous TID with meals   . insulin aspart   Subcutaneous 4x Daily - AC and HS   . insulin glargine  25 Units Subcutaneous 2 times per day   . metoprolol tartrate  100 mg Oral 2 times per day   . polyethylene glycol  17 g Oral Daily   . senna-docusate  2 tablet Oral 2 times per day   . sodium chloride 0.9 % (NS) syringe  5-10 mL Intravenous Q8H SCH       PRN Medications:  acetaminophen, benzocaine-menthol, bisacodyl, dextrose, dextrose, dextrose, diphenhydrAMINE, diphenhydrAMINE, glucagon (human recombinant), HYDROcodone-acetaminophen, magnesium hydroxide, melatonin, nicotine polacrilex, phenol, polyvinyl alcohol, sodium chloride 0.9 % (NS) syringe, urea    Laboratories:  Results for orders placed or performed during the hospital encounter of 01/24/Evan (from the past 24 hour(s))   POCT Glucose -Next Routine    Collection Time: 02/11/Evan  4:31 PM   Result Value Ref Range    POC Glucose 265 (H) 80 - 99 mg/dL   POCT Glucose -Next Routine    Collection Time: 02/11/Evan  8:52 PM   Result Value Ref Range    POC Glucose 318 (H) 80 - 99 mg/dL   Basic Metabolic Panel -AM Draw    Collection Time: 02/12/Evan  4:59 AM   Result Value Ref Range    Sodium 137 135 - 143 mmol/L    Potassium 4.7 3.5 - 5.1 mmol/L    Chloride 107 98 - 111 mmol/L    CO2 - Carbon Dioxide 19.0 (L) 21.0 - 31.0 mmol/L    Glucose  232 (H) 80 - 99 mg/dL    BUN 478 (H) 6 - 23 mg/dL    Creatinine 2.95 (H) 0.65 - 1.30 mg/dL    Calcium 9.0 8.6 - 62.1 mg/dL    Anion Gap 30.8 3.0 - 11.0 mmol/L    GFR Estimate 24 (L) >=60 mL/min/1.61m*2    GFR Additional Info        CBC with Auto Differential -AM Draw    Collection Time: 02/12/Evan  4:59 AM   Result Value Ref Range    WBC 15.6 (H) 4.8 - 10.8 10*3/?L    RBC 3.40 (L) 4.70 - 6.10 10*6/?L    Hemoglobin 11.3 (L) 12.0 - Evan.0 g/dL    HCT 65.7 (L) 84.6 - 54.0 %    MCV 100.7 (H) 81.0 - 99.0 fL    MCH 33.4 27.0 - 34.0 pg    MCHC 33.1 32.0 - 36.0 g/dL    RDW 96.2 95.2 - 84.1 %    Platelet Count 264 150 - 400 10*3/?L    MPV 9.8 7.4 - 10.4 fL    Neutrophils % 74 (H) 35 - 70 %    Lymphocytes % 9 (L) 25 - 45 %    Monocytes % 9 0 - 12 %    Eosinophils % 6 0 - 7 %    Basophils % 2 0 - 2 %    Neutrophils, Absolute 11.6 (H) 1.6 - 7.3 10*3/?L    Lymphocytes, Absolute 1.4 1.1 - 4.3 10*3/?L    Monocytes, Absolute 1.4 (H) 0.0 - 1.2 10*3/?L    Eosinophils, Absolute 0.9 (H) 0.0 - 0.7 10*3/?L    Basophils, Absolute 0.3 (H) 0.0 - 0.2 10*3/?L    Differential Type Manual Differential     Platelet Estimate Normal Normal    Macrocytes 1+ (A) (none)   POCT Glucose -Next Routine    Collection Time: 02/12/Evan  5:49 AM   Result Value Ref Range    POC Glucose 252 (H) 80 - 99 mg/dL   POCT Glucose -Next Routine    Collection Time: 02/12/Evan 11:25 AM   Result Value Ref Range    POC Glucose 273 (H) 80 - 99 mg/dL       Active Hospital Problems    Diagnosis SNOMED CT(R) Date Noted   . Cellulitis CELLULITIS 07/12/2016   . Weakness generalized ASTHENIA 07/12/2016   . Cellulitis of left lower extremity CELLULITIS OF LEFT LOWER LIMB 07/12/2016   . Syncope and collapse SYNCOPE AND COLLAPSE 07/12/2016   . Fall, initial encounter FALL 07/12/2016   . Elevated troponin HIGH TROPONIN I LEVEL 07/12/2016   . Uncontrolled type 2 diabetes mellitus with stage 3 chronic kidney disease, with long-term current use of insulin (CMS/HCC) TYPE 2 DIABETES MELLITUS 07/12/2016   . COPD exacerbation (CMS/HCC) ACUTE EXACERBATION OF CHRONIC OBSTRUCTIVE AIRWAYS DISEASE 07/12/2016   . Sepsis due to undetermined organism (CMS/HCC) SEPSIS 07/12/2016   . Fever FEVER 07/12/2016       30 minutes

## 2016-07-30 NOTE — Plan of Care (Signed)
Absence of urinary retention Progressing      Absence of cardiac dysrhythmias or at baseline Progressing      Return mobility to safest level of function Progressing      Hemodynamic stability and optimal renal function maintained Progressing

## 2016-07-31 LAB — BASIC METABOLIC PANEL
Anion Gap: 12.8 mmol/L — ABNORMAL HIGH (ref 3.0–11.0)
BUN: 103 mg/dL — ABNORMAL HIGH (ref 6–23)
CO2 - Carbon Dioxide: 17.2 mmol/L — ABNORMAL LOW (ref 21.0–31.0)
Calcium: 9.5 mg/dL (ref 8.6–10.3)
Chloride: 103 mmol/L (ref 98–111)
Creatinine: 2.85 mg/dL — ABNORMAL HIGH (ref 0.65–1.30)
GFR Estimate: 22 mL/min/1.73m*2 — ABNORMAL LOW (ref >=60)
Glucose: 277 mg/dL — ABNORMAL HIGH (ref 80–99)
Potassium: 4.6 mmol/L (ref 3.5–5.1)
Sodium: 133 mmol/L — ABNORMAL LOW (ref 135–143)

## 2016-07-31 LAB — CBC WITH AUTO DIFFERENTIAL
Basophils %: 0 % (ref 0–2)
Basophils, Absolute: 0 10*3/ÂµL (ref 0.0–0.2)
Eosinophils %: 15 % — ABNORMAL HIGH (ref 0–7)
Eosinophils, Absolute: 3 10*3/ÂµL — ABNORMAL HIGH (ref 0.0–0.7)
HCT: 39.2 % — ABNORMAL LOW (ref 42.0–54.0)
Hemoglobin: 12.8 g/dL (ref 12.0–18.0)
Lymphocytes %: 10 % — ABNORMAL LOW (ref 25–45)
Lymphocytes, Absolute: 2 10*3/ÂµL (ref 1.1–4.3)
MCH: 32.8 pg (ref 27.0–34.0)
MCHC: 32.7 g/dL (ref 32.0–36.0)
MCV: 100.2 fL — ABNORMAL HIGH (ref 81.0–99.0)
MPV: 10.1 fL (ref 7.4–10.4)
Monocytes %: 7 % (ref 0–12)
Monocytes, Absolute: 1.4 10*3/ÂµL — ABNORMAL HIGH (ref 0.0–1.2)
Neutrophils %: 68 % (ref 35–70)
Neutrophils, Absolute: 13.7 10*3/ÂµL — ABNORMAL HIGH (ref 1.6–7.3)
Platelet Count: 332 10*3/ÂµL (ref 150–400)
Platelet Estimate: NORMAL
RBC Morphology: NORMAL
RBC: 3.91 10*6/ÂµL — ABNORMAL LOW (ref 4.70–6.10)
RDW: 13.8 % (ref 11.5–14.5)
WBC: 20.1 10*3/ÂµL — ABNORMAL HIGH (ref 4.8–10.8)

## 2016-07-31 LAB — POCT GLUCOSE
POC Glucose: 213 mg/dL — ABNORMAL HIGH (ref 80–99)
POC Glucose: 215 mg/dL — ABNORMAL HIGH (ref 80–99)
POC Glucose: 253 mg/dL — ABNORMAL HIGH (ref 80–99)

## 2016-07-31 MED ORDER — sodium bicarbonate 1 mEq/mL (8.4 %) 150 mEq in dextrose 5 % (D5W) 1,000 mL infusion
8.4 | INTRAVENOUS | Status: DC
Start: 2016-07-31 — End: 2016-08-01
  Administered 2016-07-31 – 2016-08-01 (×5): via INTRAVENOUS

## 2016-07-31 MED FILL — HEPARIN, PORCINE (PF) 5,000 UNIT/0.5 ML SYRINGE: 5000 unit/0.5 mL | INTRAMUSCULAR | Qty: 0.5

## 2016-07-31 MED FILL — SODIUM BICARBONATE 8.4 % (1 MEQ/ML) INTRAVENOUS SOLUTION: 8.4 % (1 mEq/mL) | INTRAVENOUS | Qty: 150

## 2016-07-31 MED FILL — DIPHENHYDRAMINE 25 MG CAPSULE: 25 mg | ORAL | Qty: 1

## 2016-07-31 MED FILL — SENNA PLUS 8.6 MG-50 MG TABLET: 8.6-50 mg | ORAL | Qty: 2

## 2016-07-31 MED FILL — FUROSEMIDE 40 MG TABLET: 40 mg | ORAL | Qty: 1

## 2016-07-31 MED FILL — ASPIRIN 81 MG TABLET,ENTERIC COATED: 81 mg | ORAL | Qty: 1

## 2016-07-31 MED FILL — LANTUS U-100 INSULIN 100 UNIT/ML SUBCUTANEOUS SOLUTION: 100 [IU]/mL | SUBCUTANEOUS | Qty: 0.25

## 2016-07-31 MED FILL — MIRALAX 17 GRAM ORAL POWDER PACKET: 17 g | ORAL | Qty: 1

## 2016-07-31 MED FILL — FUROSEMIDE 20 MG TABLET: 20 mg | ORAL | Qty: 1

## 2016-07-31 MED FILL — METOPROLOL TARTRATE 50 MG TABLET: 50 mg | ORAL | Qty: 2

## 2016-07-31 MED FILL — MELATONIN 3 MG TABLET: 3 mg | ORAL | Qty: 1

## 2016-07-31 MED FILL — ATORVASTATIN 20 MG TABLET: 20 mg | ORAL | Qty: 1

## 2016-07-31 NOTE — Progress Notes (Signed)
Progress Note (Medicine)   Name: Evan [name]  DOB: 01/02/1946 71 y.o.  MRN: [id number]  CSN: [id number]  Evan [name]    Background information   Evan [name] is a 71 y.o. male   LOS: 19 days    Chief complaint:  Considerable improvement of skin rash that remains more prominent in extremities.    History of Present Illness:  71 year old man with morbid obesity, hypertension, sleep apnea on CPAP, chronic kidney disease stage III, type 2 diabetes. Presents to the hospital complaining of increasing weakness and fever as well as a syncopal event. EMS found hypoxia and on arrival he had a lactic acidosis of 4. Serum creatinine was elevated at 1.8 and had a positive troponin of 0.11 that peaked at 7.7. He denied chest pain at rest. He complained of weakness and coughing as well as shortness of breath. A stress test showed a moderate probability for underlying COPD but cardiology recommended medical management until acute issues are resolved. An echo cardiogram showed a right heart strain but the patient has a history of obstructive sleep apnea and obesity. An ultrasound of the lower extremity did not show evidence of DVT. A CT pulmonary angiogram showed no evidence of pulmonary emboli. He subsequently developed contrast-induced nephropathy with serum creatinine peaked at 5.1.    For treatment of his left lower extremity cellulitis he initially received ceftriaxone and vancomycin for 2 days then followed by ceftriaxone and levofloxacin for one day followed by levofloxacin alone for the following 5 days. He then received ampicillin for one day followed by vancomycin for 2 days and thereafter he's received 6 days of cefazolin, and completed therapy on 2/10 for a total of 15 days of therapy.    Subjective:  There is acute events overnight, such as chest pain, shows of breath, fever or chills    Assessment & Plan:     - Left lower extremity cellulitis   Has completed 2 weeks of a pneumatic therapy.  ?  - Diffuse  macular papular rash   Likely drug related from Antibiotic, likely Levaquin. Completed prednisone 40 mg for 5 days.     - Insulin-dependent type 2 diabetes   Improvement of CBGs with current insulin regimen.    - Acute kidney injury metabolic acidosis on chronic disease stage III   Baseline 1.3 and currently 2.85. Suspected contrast-induced nephropathy. Discontinue Lasix and give Bicarbonate infusion 75 ml/hr.    - Severe sepsis on admission secondary to cellulitis   Resolved.     - Leukocytosis   Elevated secondary to steroid use and rash. Continue to monitor daily with labs.    - Syncope   Likely due to sepsis with hypotension. Resolved.     - Non-STEMI   Denies CP. Continue with aspirin, statin and beta blocker. Further management as outpatient.     - Elevated MCV  TSH, B12 and folate are within normal range.    - Edema of lower extremities   Likely secondary to a volume overload state with acute kidney injury and right-sided heart failure. I/Os    - Hypertension   Normotensive. Lisinopril and hydrochlorothiazide on hold due to AKI.     - Obstructive sleep apnea on CPAP  ?  Disposition/Planning: SNF on discharge.     Objective:    Vitals Current 24 Hour Min / Max      Temp    36.8 ?C (98.3 ?F)    Temp  Min: 36.4 ?C (97.5 ?F)  Max:  36.8 ?C (98.3 ?F)      BP     120/78     BP  Min: 104/68  Max: 134/82      HR    57    Pulse  Min: 57  Max: 73      RR    18    Resp  Min: 16  Max: 18      Sats      SpO2: 98 % on  L/min       SpO2  Min: 93 %  Max: 98 %      Weight    (!) 164.3 kg (362 lb 4.8 oz)  Body mass index is 47.54 kg/m?[name]    Admit: (!) 181.4 kg (400 lb)   Intake/Ouput:    Intake/Output Summary (Last 24 hours) at 07/31/16 1150  Last data filed at 07/31/16 1045   Gross per 24 hour   Intake             2480 ml   Output             3250 ml   Net             -770 ml           Physical Exam:  Gen: no acute distress.  Lungs: clear to auscultation bilaterally  Heart: regular rate and rhythm  Abdomen: obese without  tenderness. Bowel sounds are present  Extremities: trace edema of lower extremities.  Skin: improving maculopapular rash  Neuro: no focal neurological deficits  Psych: stable mood and affect    Medications:  . aspirin  81 mg Oral Daily   . atorvastatin  20 mg Oral QPM   . heparin  5,000 Units Subcutaneous Q8H SCH   . hydrocortisone   Topical 2 times per day   . insulin aspart  20 Units Subcutaneous TID with meals   . insulin aspart   Subcutaneous 4x Daily - AC and HS   . insulin glargine  25 Units Subcutaneous 2 times per day   . metoprolol tartrate  100 mg Oral 2 times per day   . polyethylene glycol  17 g Oral Daily   . senna-docusate  2 tablet Oral 2 times per day   . sodium chloride 0.9 % (NS) syringe  5-10 mL Intravenous Q8H SCH       PRN Medications:  acetaminophen, benzocaine-menthol, bisacodyl, dextrose, dextrose, dextrose, diphenhydrAMINE, diphenhydrAMINE, glucagon (human recombinant), HYDROcodone-acetaminophen, magnesium hydroxide, melatonin, nicotine polacrilex, phenol, polyvinyl alcohol, sodium chloride 0.9 % (NS) syringe, urea    Laboratories:  Results for orders placed or performed during the hospital encounter of 07/11/16 (from the past 24 hour(s))   POCT Glucose -Next Routine    Collection Time: 07/30/16  4:44 PM   Result Value Ref Range    POC Glucose 213 (H) 80 - 99 mg/dL   POCT Glucose -Next Routine    Collection Time: 07/30/16 11:05 PM   Result Value Ref Range    POC Glucose 215 (H) 80 - 99 mg/dL   Basic Metabolic Panel -AM Draw    Collection Time: 07/31/16  4:21 AM   Result Value Ref Range    Sodium 133 (L) 135 - 143 mmol/L    Potassium 4.6 3.5 - 5.1 mmol/L    Chloride 103 98 - 111 mmol/L    CO2 - Carbon Dioxide 17.2 (L) 21.0 - 31.0 mmol/L    Glucose 277 (H) 80 - 99 mg/dL    BUN  103 (H) 6 - 23 mg/dL    Creatinine 2.72 (H) 0.65 - 1.30 mg/dL    Calcium 9.5 8.6 - 53.6 mg/dL    Anion Gap 64.4 (H) 3.0 - 11.0 mmol/L    GFR Estimate 22 (L) >=60 mL/min/1.62m*2    GFR Additional Info     CBC with Auto  Differential -AM Draw    Collection Time: 07/31/16  4:21 AM   Result Value Ref Range    WBC 20.1 (H) 4.8 - 10.8 10*3/?L    RBC 3.91 (L) 4.70 - 6.10 10*6/?L    Hemoglobin 12.8 12.0 - 18.0 g/dL    HCT 03.4 (L) 74.2 - 54.0 %    MCV 100.2 (H) 81.0 - 99.0 fL    MCH 32.8 27.0 - 34.0 pg    MCHC 32.7 32.0 - 36.0 g/dL    RDW 59.5 63.8 - 75.6 %    Platelet Count 332 150 - 400 10*3/?L    MPV 10.1 7.4 - 10.4 fL    Neutrophils % 68 35 - 70 %    Lymphocytes % 10 (L) 25 - 45 %    Monocytes % 7 0 - 12 %    Eosinophils % 15 (H) 0 - 7 %    Basophils % 0 0 - 2 %    Neutrophils, Absolute 13.7 (H) 1.6 - 7.3 10*3/?L    Lymphocytes, Absolute 2.0 1.1 - 4.3 10*3/?L    Monocytes, Absolute 1.4 (H) 0.0 - 1.2 10*3/?L    Eosinophils, Absolute 3.0 (H) 0.0 - 0.7 10*3/?L    Basophils, Absolute 0.0 0.0 - 0.2 10*3/?L    Differential Type Manual Differential     Platelet Estimate Normal Normal    RBC Morphology Normal    POCT Glucose -Next Routine    Collection Time: 07/31/16  6:19 AM   Result Value Ref Range    POC Glucose 253 (H) 80 - 99 mg/dL       Active Hospital Problems    Diagnosis SNOMED CT(R) Date Noted   . Cellulitis CELLULITIS 07/12/2016   . Weakness generalized ASTHENIA 07/12/2016   . Cellulitis of left lower extremity CELLULITIS OF LEFT LOWER LIMB 07/12/2016   . Syncope and collapse SYNCOPE AND COLLAPSE 07/12/2016   . Fall, initial encounter FALL 07/12/2016   . Elevated troponin HIGH TROPONIN I LEVEL 07/12/2016   . Uncontrolled type 2 diabetes mellitus with stage 3 chronic kidney disease, with long-term current use of insulin (CMS/HCC) TYPE 2 DIABETES MELLITUS 07/12/2016   . COPD exacerbation (CMS/HCC) ACUTE EXACERBATION OF CHRONIC OBSTRUCTIVE AIRWAYS DISEASE 07/12/2016   . Sepsis due to undetermined organism (CMS/HCC) SEPSIS 07/12/2016   . Fever FEVER 07/12/2016       25 minutes

## 2016-07-31 NOTE — Progress Notes (Signed)
Wound Care Note:  Follow up    Patient name: Evan [name] Room: 6477/6477-01 Time: 12:03 PM   Date of Birth: Jul 01, 1945  Date: 07/31/2016     Gender: male   MRN: [id number]   Attending: Sherron Ales, MD     Admitting Dx: Syncope and collapse [R55];Fever, unknown origin [R50.9];Weakness generalized [R53.1];Hyperglycemia [R73.9];Elevated troponin [R74.8];COPD exacerbation (CMS/HCC) [J44.1];Fever [R50.9];Cellulitis of left lower extremity [L03.116];Fall, initial encounter [W19.XXXA];Sepsis due to undetermined organism (CMS/HCC) [A41.9]        07/31/16 1100   Wounds and Other Ulcers 07/15/16  Leg Left;Anterior   Date First Assessed: 07/15/16   Wound Type: (c)   Location: Leg  Wound Location Orientation: Left;Anterior  Pre-existing: Yes   State of Healing Healing   Wound Assessment Epithelizalization   Peri-wound Assessment (flaky, scaly)   Drainage Amount None   Treatments Debrided: Mechanical;Debrided: Lambert Mody   Dressing Other (Comment)  (attractain, double layer of tubgrip F)   Dressing Status None   Wounds and Other Ulcers 07/19/16  Leg Left;Posterior;Medial   Date First Assessed/Time First Assessed: 07/19/16 1347   Wound Type: (c)   Location: Leg  Wound Location Orientation: Left;Posterior;Medial  Pre-existing: No   State of Healing Healing   Wound Assessment Epithelizalization   Peri-wound Assessment (flaky, scaly)   Drainage Amount None   Treatments Debrided: Mechanical;Debrided: Lambert Mody   Dressing Other (Comment)  (attractain, double layer of tubigrip F)   Dressing Status None         Hospital Problem List as of 07/31/2016       Hospital    Fever    Cellulitis    Weakness generalized    Cellulitis of left lower extremity    Syncope and collapse    Fall, initial encounter    Elevated troponin    Uncontrolled type 2 diabetes mellitus with stage 3 chronic kidney disease, with long-term current use of insulin (CMS/HCC)    COPD exacerbation (CMS/HCC)    Sepsis due to undetermined organism (CMS/HCC)                                   Allergies:   Allergies   Allergen Reactions   . Levofloxacin Rash     Maculopapular rash       Braden Scale:   Sensory Perceptions: No impairment  Moisture: Occasionally moist  Activity: Walks occasionally  Mobility: Slightly limited  Nutrition: Adequate  Friction and Shear: No apparent problem  Braden Scale Score: 19  On appropriate surface? yes    Labs:   Lab Results   Component Value Date    WHITEBLOODCE 20.1 (H) 07/31/2016    WHITEBLOODCE 15.6 (H) 07/30/2016    WHITEBLOODCE 18.6 (H) 07/29/2016    HGB 12.8 07/31/2016    HGB 11.3 (L) 07/30/2016    HGB 11.7 (L) 07/29/2016    HCT 39.2 (L) 07/31/2016    HCT 34.2 (L) 07/30/2016    HCT 36.3 (L) 07/29/2016    MCV 100.2 (H) 07/31/2016    MCV 100.7 (H) 07/30/2016    MCV 101.3 (H) 07/29/2016    LABPLAT 332 07/31/2016    LABPLAT 264 07/30/2016    LABPLAT 273 07/29/2016       No results for input(s): SEDRATE in the last 72 hours.  No results for input(s): ALBUMIN in the last 72 hours.  No results for input(s): CRP in the last 72 hours.  Body mass index is 47.54  kg/m?.  Nutritional Status:  anemic    Neuro Assessment: Awake, alert and cooperative with care    Fall Risk: Score: 50  Fall Risk: Medium  Precautions Observed     Isolation Status:  standard precautions observed    Pain - none    Family Present: wife    Last dressing change: unknown    Debridement:   Mechanical: gauze was used to debride scaly, built up epithelium from LLE.   Conservative sharp debridement using:  scissors were used to debride scaly, built up epithelium from LLE without bleeding.    Treatment/Rationale: Attractain applied to bilateral legs as moisturizer. Antifungal cream applied to toes but not in between toes to address fungal component. Double layer of moderate compression tubigrip applied for edema control.    Lift Assist: 1 assist     Education:    Who: patient and family      What reviewed: Wound Care, and compression therapy, importance of leg elevation, calf pump exericises.     Pt understanding: good  Coordination of Care with: patient, nurse and family     Impression: No open areas. R calf: 48.5, L calf: 45. Patient hasn't had ABI.                    Recommendations:   Attractain to be applied daily. Tubigrips to be appied 5-10 minutes after attractain application.  Wound Care team to follow-up PRN.      Supplies: 4 yards tubigrip F

## 2016-07-31 NOTE — Progress Notes (Signed)
End of Shift Report     Significant Events None     Relevant   Assessment Findings Tele SB 50's. Lungs clear. Up with 1 person assist with FWW. No complaints of pain.       Patient/Family  Questions/Concerns none     DC Barriers Improved renal function.

## 2016-07-31 NOTE — Progress Notes (Signed)
End of Shift Report     Significant Events SR/SB to rate of 51. CPAP at night. Slept through night. CBG 215 and 253; coverage given, as ordered. 1 x benadryl given for itching last night.     Relevant   Assessment Findings Rash has much improved. Lung sounds clear and diminished       Patient/Family  Questions/Concerns None voiced     DC Barriers Clinical improvement   General Medicine Patients       Is the patient voiding or having normal catheter output? Yes, using urinal      Is the patient eating/drinking adequately? yes      Last bowel movement?   07/31/2016      What is the stability of patient?  stable      How is pain managed and controlled? (last dose) None reported       Calls made to patient care provider   none      New orders related to changes in patient condition?  n/a       Miscellaneous information from the shift none   Dialysis Patients        When was last dialysis and were there any issues? N/A      When is next dialysis planned any special instructions? N/A

## 2016-07-31 NOTE — Other (Signed)
Physical Therapy Notes  PT Daily Note    Patient name: Evan [name]  Date: 07/31/2016    SUBJECTIVE    Pt was not seen for a planned PT session for the reason listed below.    PLAN  Goals to cont per POC.      Therapist/License #: Verita Schneiders PTA / [id number]  Date: 07/31/2016    Missed Time: 30  Missed Time Reason: 6 (number from choice list below)    Pt sleeping soundly - woke pt up and he declines therapy. Will re-attempt if schedule permits.      Key: 1- Questionable vitals   2- Nausea/vomiting   3- Procedure/unavailable   4- Bowel/bladder issues   5- Pain - needs break/meds   6- Fatigue   7- Behavioral   8- Other

## 2016-07-31 NOTE — Discharge Planning (AHS/AVS) (Signed)
Patient tentatively will be ready to discharge tomorrow to [facility] pending medical stability per Physician.

## 2016-07-31 NOTE — Progress Notes (Signed)
PHARMACIST PROGRESS NOTE     The following pharmacy services were provided: renal dosing    SUBJECTIVE / OBJECTIVE     Evan [name] is a 71 y.o. Male admitted on 07/11/2016.    ASSESSMENT       PLAN       Renal Dosing  Meds do not require adjustment at this time.           Signed by: Rudi Coco, RPH

## 2016-07-31 NOTE — Treatment Summary (Signed)
IP PT TREATMENT NOTE     Patient:  Evan [name] / 6477/6477-01 DOB:  22-Aug-1945 / 71 y.o. Date:  07/31/2016     Precautions  Specific mobility precautions: None     ASSESSMENT     Pt ambulated x200 feet with FWW and CGA.   Utilized several static standing rests secondary to fatigue and increased WOB.  No dizziness throughout.  1 significant LOB during a static standing rest but able to self-correct with use of FWW.    Most limited by fatigue, general weakness and limited endurance/activity tolerance.    Given his Loss of Balance, it would behoove pt to discharge to SNF for continued rehabilitation towards a safe and functional Independence with ADL's.     Able to be up with Nursing Team (x 1).     Discharge recommendations  Mobility Equipment needs: Front wheeled walker (FWW)   Primary recommendation : Patient would benefit from SNF/Nursing Home/Other Skilled Facility.  Recommended transport method: Wheelchair  Secondary recommendation: Patient would benefit from Home Health PT.  Justification: Pt still wanting SNF at this time. Pt had 1x LOB today and was able to correct but is still experiencing general weakness. SNF would improve pt's strength and thus improve balance.      PLAN     Treatment Plan Frequency    Plan: Continue per established POC.  Pt. demonstrates limitations which require skilled PT intervention.  1x (5 days/week).  Frequency will be increased as pt's condition or discharge plan indicates.     Evan [name] will remain on the Physical Therapy schedule until goals are met, or there has been a change in the Plan of Care.          Treatment Diagnosis Surgery / # Days Post-op (if applicable)    Primary Dx: Sepsis, cellulitis  Treatment Dx: Impaired mobility   /         SUBJECTIVE   Pt agreeable to treatment.     Pain Level  Current status: Pain does not limit pt's ability to participate in therapy  Pain at start of session: 0 - No Pain  Pain at end of session: 0 - No Pain     OBJECTIVE           Mental status  General Demeanor: Pleasant;Cooperative  LOC: Alert  Orientation: Oriented x 4  Directions: Follows multi-step commands  --> Follows multi-step commands: Consistently  Attention span: Appears intact  Memory: Appears intact in social/therapy situations     TREATMENT  Bed Mobility/Transfers  Bed mobility       Rolling / logroll Rolling R: SBA      Transition to sitting up Sidelying to Sit: CGA   To resting position      Transition to sitting down SBA     Transition to supine Min Assist (MinA to bring LLE onto bed)     Scooting Independent   Transfers      Transition to standing From bed: SBA     Method        Gait/Stairs  Mobility - Gait (Level)  Gait (distance): 200 feet (With intermittent static standing rests secondary to fatigue)  Gait (assist level): CGA;Loss of balance (1X LOB)  Assistive device: Front wheeled walker (FWW)  Endurance: Improving  Pace: Slow  Pattern: Downward gaze;Shuffling;Wide BOS  Surface: Level tile      Training/Education  Trainee(s): Primary Learner  Primary Learner: Patient  Primary Learner - Barriers to Learning?: No  Training  provided: Pacing/Energy Conservation  Response to training: Receptive   Safety/Room Set-up   Mobility in room per PT: Able to be up with Nursing Team (x 1).  Call button accessible?: Yes  Phone accessible?: Yes  Oxygen reconnected?: No, on room air  Position on arrival: In bed  Position on departure (bed): Bed in lowest position;Standard bed;4 bedrails up, as per patient status on presentation*     GOALS     Patient/Caregiver goals reviewed and integrated with rehab treatment plan:      Multidisciplinary Problems (Active)        Problem: IP General Goal List - 2    Goal Priority Disciplines Outcome   Bed Mobility     PT    Description:  Pt. to perform bed mobility with min assist.    Transfers     PT    Description:  Pt. to perform all functional transfers with min. assist.    Ambulation - Levels     PT    Description:  Pt. to ambulate with min  assist x 50 feet with appropriate assistive device.    Ambulation - Stairs     PT    Description:  Pt. to ascend/descend stairs with min assist, as per discharge environment.    Home Exercise Program     PT    Description:  Pt. to perform basic HEP with min assist.    Caregiver Training     PT    Description:  Patient/caregiver training will be provided, as needed to achieve goals.             Problem: Patient/Family Goal    Goal Priority Disciplines Outcome   Normal Status     PT    Description:  Pt. wants to return to previous level of function.     Home     PT    Description:  Pt. wants to return to previous living situation.    Mobility     PT    Description:  Patient wants to walk again.                      G-Codes/PSFS/FIM Scores: (if applicable)          First session   Start/Stop times: 1438 - 1511   Total time: 33 minutes      This note to serve as a Discharge Summary if Evan [name] is discharged from the hospital or from therapy services.     Therapist:  Verita Schneiders

## 2016-07-31 NOTE — Progress Notes (Signed)
Nutrition Follow UP    Evan [name] is a 71 y.o. male, DOB 03-05-1946 admitted with Sepsis, Fever, cellulitis, h/o Diabetes Type 2 with CKD stage 3, and morbid obesity.    2/13: Patient had diabetes education on 2/12. He is consuming 100% of meals per intake sheets and per patient report he is eating most of his food. No concerns of N/V/D/C. Patient noted that nutrition/diet education has been provided previously and that he has just been hearing repetitions of it.    2/5: Nutrition screening and assessment has been completed and we do not have any recommendations at this time. If the dietitian or other nutrition services staff might be of assistance with the nutritional care of this patient, it would be our pleasure.     Per nursing admit screen no weight loss or loss of appetite noted PTA.      Body mass index is 47.54 kg/m?[name]  Ht/wt status:morbid obesity   Has 2-3+ pitting edema to BLE.     Diet Orders:   Procedures   . Diet, Diabetic Diabetic, Consistent Carbohydrate Medium; Sodium 3 GM; Heart Healthy; Yes - patient to order food   CBG's: 185-274 mg/dL.  Current diet appropriate.  Kayexalate given to help manage potassium.  Diabetes educator saw pt 07/23/16 and 07/30/16    P.O. Intake: continues to have good appetite and eating 100% of meals.    Meeting PO goal of consume > 70% of meals.    Will follow per protocol.  Corinda Gubler, Dietetic Intern  07/31/2016 9:23 AM  [facility]

## 2016-08-01 LAB — CBC WITH AUTO DIFFERENTIAL
Basophils %: 3 % — ABNORMAL HIGH (ref 0–2)
Basophils, Absolute: 0.5 10*3/ÂµL — ABNORMAL HIGH (ref 0.0–0.2)
Eosinophils %: 15 % — ABNORMAL HIGH (ref 0–7)
Eosinophils, Absolute: 2.5 10*3/ÂµL — ABNORMAL HIGH (ref 0.0–0.7)
HCT: 37 % — ABNORMAL LOW (ref 42.0–54.0)
Hemoglobin: 12 g/dL (ref 12.0–18.0)
Lymphocytes %: 6 % — ABNORMAL LOW (ref 25–45)
Lymphocytes, Absolute: 1 10*3/ÂµL — ABNORMAL LOW (ref 1.1–4.3)
MCH: 33.2 pg (ref 27.0–34.0)
MCHC: 32.4 g/dL (ref 32.0–36.0)
MCV: 102.7 fL — ABNORMAL HIGH (ref 81.0–99.0)
MPV: 10 fL (ref 7.4–10.4)
Monocytes %: 3 % (ref 0–12)
Monocytes, Absolute: 0.5 10*3/ÂµL (ref 0.0–1.2)
Neutrophils %: 73 % — ABNORMAL HIGH (ref 35–70)
Neutrophils, Absolute: 12.2 10*3/ÂµL — ABNORMAL HIGH (ref 1.6–7.3)
Platelet Count: 216 10*3/ÂµL (ref 150–400)
Platelet Estimate: NORMAL
RBC: 3.6 10*6/ÂµL — ABNORMAL LOW (ref 4.70–6.10)
RDW: 13.9 % (ref 11.5–14.5)
WBC: 16.7 10*3/ÂµL — ABNORMAL HIGH (ref 4.8–10.8)

## 2016-08-01 LAB — BASIC METABOLIC PANEL
Anion Gap: 10.3 mmol/L (ref 3.0–11.0)
BUN: 90 mg/dL — ABNORMAL HIGH (ref 6–23)
CO2 - Carbon Dioxide: 19.7 mmol/L — ABNORMAL LOW (ref 21.0–31.0)
Calcium: 9.1 mg/dL (ref 8.6–10.3)
Chloride: 105 mmol/L (ref 98–111)
Creatinine: 2.29 mg/dL — ABNORMAL HIGH (ref 0.65–1.30)
GFR Estimate: 28 mL/min/1.73m*2 — ABNORMAL LOW (ref >=60)
Glucose: 175 mg/dL — ABNORMAL HIGH (ref 80–99)
Potassium: 4.3 mmol/L (ref 3.5–5.1)
Sodium: 135 mmol/L (ref 135–143)

## 2016-08-01 LAB — POCT GLUCOSE
POC Glucose: 190 mg/dL — ABNORMAL HIGH (ref 80–99)
POC Glucose: 218 mg/dL — ABNORMAL HIGH (ref 80–99)
POC Glucose: 178 mg/dL — ABNORMAL HIGH (ref 80–99)
POC Glucose: 247 mg/dL — ABNORMAL HIGH (ref 80–99)

## 2016-08-01 MED ORDER — sodium bicarbonate tablet 648 mg
650 | Freq: Four times a day (QID) | ORAL | Status: DC
Start: 2016-08-01 — End: 2016-08-02
  Administered 2016-08-01 – 2016-08-02 (×5): 650 mg via ORAL

## 2016-08-01 MED FILL — METOPROLOL TARTRATE 50 MG TABLET: 50 mg | ORAL | Qty: 2

## 2016-08-01 MED FILL — HEPARIN, PORCINE (PF) 5,000 UNIT/0.5 ML SYRINGE: 5000 unit/0.5 mL | INTRAMUSCULAR | Qty: 0.5

## 2016-08-01 MED FILL — SENNA PLUS 8.6 MG-50 MG TABLET: 8.6-50 mg | ORAL | Qty: 2

## 2016-08-01 MED FILL — ASPIRIN 81 MG TABLET,ENTERIC COATED: 81 mg | ORAL | Qty: 1

## 2016-08-01 MED FILL — LANTUS U-100 INSULIN 100 UNIT/ML SUBCUTANEOUS SOLUTION: 100 [IU]/mL | SUBCUTANEOUS | Qty: 0.25

## 2016-08-01 MED FILL — SODIUM BICARBONATE 650 MG TABLET: 650 mg | ORAL | Qty: 1

## 2016-08-01 MED FILL — DIPHENHYDRAMINE 25 MG CAPSULE: 25 mg | ORAL | Qty: 1

## 2016-08-01 MED FILL — SODIUM BICARBONATE 8.4 % (1 MEQ/ML) INTRAVENOUS SOLUTION: 8.4 % (1 mEq/mL) | INTRAVENOUS | Qty: 150

## 2016-08-01 MED FILL — ATORVASTATIN 20 MG TABLET: 20 mg | ORAL | Qty: 1

## 2016-08-01 NOTE — Progress Notes (Signed)
Reported to [place], [place]

## 2016-08-01 NOTE — Treatment Summary (Signed)
IP PT TREATMENT NOTE     Patient:  Evan [name] / 6477/6477-01 DOB:  Jul 23, 1945 / 71 y.o. Date:  08/01/2016     Precautions  Specific mobility precautions: None     ASSESSMENT     Pt mobilizing with some increased ambulation distance today compared to previous treatment for a full total 210 feet with FWW and CGA.  Pt did not exhibit any Losses of Balance in today's treatment.  Pt experiencing some mild discomfort 2/10 to R foot where tubigrip sock is folded - attempted x2 to smooth out material but where tubigrip folds - regardless it causes discomfort to pt's foot.   Pt also stating that the tubigrip in general is causing some discomfort stating that it feels like it's Just too tight. WoundCare staff made aware verbally.    Pt still requesting SNF for continued rehabilitation for best possible outcome towards a safe and functional Independence - which is appropriate given that the patient is still unable to ambulate a house-hold distance without an assist of 1 for safety.       Able to be up with Nursing Team (x 1).     Discharge recommendations  Mobility Equipment needs: To be determined later   Primary recommendation : Patient would benefit from SNF/Nursing Home/Other Skilled Facility.  Recommended transport method: Wheelchair  Secondary recommendation: Patient would benefit from Home Health PT.  Justification: Pt still wanting SNF at this time. Pt had 1x LOB today and was able to correct but is still experiencing general weakness. SNF would improve pt's strength and thus improve balance.      PLAN     Treatment Plan Frequency    Plan: Continue per established POC.  Pt. demonstrates limitations which require skilled PT intervention.  1x (5 days/week).  Frequency will be increased as pt's condition or discharge plan indicates.     Evan [name] will remain on the Physical Therapy schedule until goals are met, or there has been a change in the Plan of Care.          Treatment Diagnosis Surgery / # Days  Post-op (if applicable)    Primary Dx: Sepsis, cellulitis  Treatment Dx: Impaired mobility   /         SUBJECTIVE   Pt agreeable to treatment.     Pain Level  Current status: Pain does not limit pt's ability to participate in therapy  Pain at start of session: 0 - No Pain  Pain at end of session: 0 - No Pain     OBJECTIVE        Mental status  General Demeanor: Pleasant;Cooperative  LOC: Alert  Orientation: Oriented x 4  Directions: Follows multi-step commands  --> Follows multi-step commands: Consistently  --> Follows one-step commands: Consistently  Attention span: Appears intact  Memory: Appears intact in social/therapy situations     TREATMENT  Bed Mobility/Transfers  Bed mobility       Rolling / logroll Rolling R: SBA      Transition to sitting up Sidelying to Sit: SBA   To resting position      Transition to sitting down SBA     Transition to supine       Scooting     Transfers      Transition to standing From bed: SBA  From chair: SBA     Method        Gait/Stairs  Mobility - Gait (Level)  Gait (distance): 210 feet  Gait (assist level): CGA  Assistive device: Front wheeled walker (FWW)  Endurance: Slowly improving but does demonstrate increased Respiration Rate at end of treatment.   Pace: slow  Pattern: Downward gaze;Decreased step length R  Surface: Level tile      Safety/Room Set-up   Mobility in room per PT: Able to be up with Nursing Team (x 1).  Call button accessible?: Yes  Phone accessible?: Yes  Oxygen reconnected?: No, on room air  Position on arrival: In bed  Position on departure (upright): In regular chair     GOALS     Patient/Caregiver goals reviewed and integrated with rehab treatment plan:      Multidisciplinary Problems (Active)        Problem: IP General Goal List - 2    Goal Priority Disciplines Outcome   Bed Mobility     PT    Description:  Pt. to perform bed mobility with min assist.    Transfers     PT    Description:  Pt. to perform all functional transfers with min. assist.       Ambulation - Levels     PT    Description:  Pt. to ambulate with min assist x 50 feet with appropriate assistive device.    Ambulation - Stairs     PT    Description:  Pt. to ascend/descend stairs with min assist, as per discharge environment.    Home Exercise Program     PT    Description:  Pt. to perform basic HEP with min assist.    Caregiver Training     PT    Description:  Patient/caregiver training will be provided, as needed to achieve goals.             Problem: Patient/Family Goal    Goal Priority Disciplines Outcome   Normal Status     PT    Description:  Pt. wants to return to previous level of function.     Home     PT    Description:  Pt. wants to return to previous living situation.    Mobility     PT    Description:  Patient wants to walk again.                      G-Codes/PSFS/FIM Scores: (if applicable)          First session   Start/Stop times: 1053 - 1118   Total time: 25 minutes      This note to serve as a Discharge Summary if Rushi Chasen is discharged from the hospital or from therapy services.     Therapist:  Verita Schneiders

## 2016-08-01 NOTE — Progress Notes (Signed)
Progress Note (Medicine)   Name: Evan [name]  DOB: [birth year] 71 y.o.  MRN: [id number]  CSN: [id number]  YQM:VHQION [name]    Background information   Derron Pipkins is a 71 y.o. male   LOS: 20 days    Chief complaint:  Rash continues to improve but is present mainly in his left lower extremity and right upper extremity.    History of Present Illness:  71 year old man with morbid obesity, hypertension, sleep apnea on CPAP, chronic kidney disease stage III, type 2 diabetes. Presents to the hospital complaining of increasing weakness and fever as well as a syncopal event. EMS found hypoxia and on arrival he had a lactic acidosis of 4. Serum creatinine was elevated at 1.8 and had a positive troponin of 0.11 that peaked at 7.7. He denied chest pain at rest. He complained of weakness and coughing as well as shortness of breath. A stress test showed a moderate probability for underlying COPD but cardiology recommended medical management until acute issues are resolved. An echo cardiogram showed a right heart strain but the patient has a history of obstructive sleep apnea and obesity. An ultrasound of the lower extremity did not show evidence of DVT. A CT pulmonary angiogram showed no evidence of pulmonary emboli. He subsequently developed contrast-induced nephropathy with serum creatinine peaked at 5.1.    For treatment of his left lower extremity cellulitis he initially received ceftriaxone and vancomycin for 2 days then followed by ceftriaxone and levofloxacin for one day followed by levofloxacin alone for the following 5 days. He then received ampicillin for one day followed by vancomycin for 2 days and thereafter he's received 6 days of cefazolin, and completed therapy on 2/10 for a total of 15 days of therapy.    Subjective:  No acute events overnight. Denies chest pain, she was of breath.    Assessment & Plan:     - Left lower extremity cellulitis   Has completed 2 weeks of antibiotic therapy.  ?  - Diffuse  macular papular rash   Likely drug related from Antibiotic, likely Levaquin. Completed prednisone 40 mg for 5 days.     - Insulin-dependent type 2 diabetes   Improvement of CBGs with current insulin regimen.    - Acute kidney injury metabolic acidosis on chronic disease stage III   Baseline 1.3 and currently 2.29. Suspected contrast-induced nephropathy. Discontinued Lasix and give Bicarbonate infusion 75 ml/hr which was discontinued today in favor tablet form.    - Severe sepsis on admission secondary to cellulitis   Resolved.     - Leukocytosis   Continues to improve. Elevated secondary to steroid use and rash. Continue to monitor daily with labs.    - Syncope   Resolved. Likely due to sepsis with hypotension. Resolved.     - Non-STEMI   Denies CP. Continue with aspirin, statin and beta blocker. Further management as outpatient.     - Elevated MCV  TSH, B12 and folate are within normal range.    - Edema of lower extremities   Likely secondary to a volume overload state with acute kidney injury and right-sided heart failure. I/Os    - Hypertension   Normotensive. Lisinopril and hydrochlorothiazide on hold due to AKI.     - Obstructive sleep apnea on CPAP  ?  Disposition/Planning: Likely discharge tomorrow to skilled nursing facility.    Objective:    Vitals Current 24 Hour Min / Max      Temp  36.8 ?C (98.2 ?F)    Temp  Min: 36.4 ?C (97.5 ?F)  Max: 37.2 ?C (99 ?F)      BP     132/68     BP  Min: 120/72  Max: 140/80      HR    65    Pulse  Min: 52  Max: 74      RR    16    Resp  Min: 16  Max: 17      Sats      SpO2: 97 % on  L/min       SpO2  Min: 96 %  Max: 98 %      Weight    (!) 164.5 kg (362 lb 10.5 oz)  Body mass index is 47.59 kg/m?[name]    Admit: (!) 181.4 kg (400 lb)   Intake/Ouput:    Intake/Output Summary (Last 24 hours) at 08/01/16 1739  Last data filed at 08/01/16 1330   Gross per 24 hour   Intake             2210 ml   Output             2125 ml   Net               85 ml           Physical Exam:  Gen: No  acute distress  Lungs: Clear to auscultation bilaterally  Heart: Regular rhythm  Abdomen: Obese without tenderness  Extremities: Trace edema of lower shortness.  Skin: Improving macular papular rash  Neuro: No focal neurological deficits  Psych: Stable mood and affect    Medications:  . aspirin  81 mg Oral Daily   . atorvastatin  20 mg Oral QPM   . heparin  5,000 Units Subcutaneous Q8H SCH   . hydrocortisone   Topical 2 times per day   . insulin aspart  20 Units Subcutaneous TID with meals   . insulin aspart   Subcutaneous 4x Daily - AC and HS   . insulin glargine  25 Units Subcutaneous 2 times per day   . metoprolol tartrate  100 mg Oral 2 times per day   . polyethylene glycol  17 g Oral Daily   . senna-docusate  2 tablet Oral 2 times per day   . sodium bicarbonate  648 mg Oral 4x Daily   . sodium chloride 0.9 % (NS) syringe  5-10 mL Intravenous Q8H SCH       PRN Medications:  acetaminophen, benzocaine-menthol, bisacodyl, dextrose, dextrose, dextrose, diphenhydrAMINE, diphenhydrAMINE, glucagon (human recombinant), HYDROcodone-acetaminophen, magnesium hydroxide, melatonin, nicotine polacrilex, phenol, polyvinyl alcohol, sodium chloride 0.9 % (NS) syringe, urea    Laboratories:  Results for orders placed or performed during the hospital encounter of 07/11/16 (from the past 24 hour(s))   POCT Glucose -Next Routine    Collection Time: 07/31/16  8:04 PM   Result Value Ref Range    POC Glucose 218 (H) 80 - 99 mg/dL   Basic Metabolic Panel -AM Draw    Collection Time: 08/01/16  4:19 AM   Result Value Ref Range    Sodium 135 135 - 143 mmol/L    Potassium 4.3 3.5 - 5.1 mmol/L    Chloride 105 98 - 111 mmol/L    CO2 - Carbon Dioxide 19.7 (L) 21.0 - 31.0 mmol/L    Glucose 175 (H) 80 - 99 mg/dL    BUN 90 (H) 6 - 23 mg/dL  Creatinine 2.29 (H) 0.65 - 1.30 mg/dL    Calcium 9.1 8.6 - 16.1 mg/dL    Anion Gap 09.6 3.0 - 11.0 mmol/L    GFR Estimate 28 (L) >=60 mL/min/1.41m*2    GFR Additional Info     CBC with Auto Differential -AM  Draw    Collection Time: 08/01/16  4:19 AM   Result Value Ref Range    WBC 16.7 (H) 4.8 - 10.8 10*3/?L    RBC 3.60 (L) 4.70 - 6.10 10*6/?L    Hemoglobin 12.0 12.0 - 18.0 g/dL    HCT 04.5 (L) 40.9 - 54.0 %    MCV 102.7 (H) 81.0 - 99.0 fL    MCH 33.2 27.0 - 34.0 pg    MCHC 32.4 32.0 - 36.0 g/dL    RDW 81.1 91.4 - 78.2 %    Platelet Count 216 150 - 400 10*3/?L    MPV 10.0 7.4 - 10.4 fL    Neutrophils % 73 (H) 35 - 70 %    Lymphocytes % 6 (L) 25 - 45 %    Monocytes % 3 0 - 12 %    Eosinophils % 15 (H) 0 - 7 %    Basophils % 3 (H) 0 - 2 %    Neutrophils, Absolute 12.2 (H) 1.6 - 7.3 10*3/?L    Lymphocytes, Absolute 1.0 (L) 1.1 - 4.3 10*3/?L    Monocytes, Absolute 0.5 0.0 - 1.2 10*3/?L    Eosinophils, Absolute 2.5 (H) 0.0 - 0.7 10*3/?L    Basophils, Absolute 0.5 (H) 0.0 - 0.2 10*3/?L    Differential Type Manual Differential     Platelet Estimate Normal Normal    Macrocytes 1+ (A) (none)   POCT Glucose -Next Routine    Collection Time: 08/01/16  5:34 AM   Result Value Ref Range    POC Glucose 178 (H) 80 - 99 mg/dL   POCT Glucose -Next Routine    Collection Time: 08/01/16 11:13 AM   Result Value Ref Range    POC Glucose 247 (H) 80 - 99 mg/dL   POCT Glucose -Next Routine    Collection Time: 08/01/16  4:57 PM   Result Value Ref Range    POC Glucose 201 (H) 80 - 99 mg/dL       Active Hospital Problems    Diagnosis SNOMED CT(R) Date Noted   . Cellulitis CELLULITIS 07/12/2016   . Weakness generalized ASTHENIA 07/12/2016   . Cellulitis of left lower extremity CELLULITIS OF LEFT LOWER LIMB 07/12/2016   . Syncope and collapse SYNCOPE AND COLLAPSE 07/12/2016   . Fall, initial encounter FALL 07/12/2016   . Elevated troponin HIGH TROPONIN I LEVEL 07/12/2016   . Uncontrolled type 2 diabetes mellitus with stage 3 chronic kidney disease, with long-term current use of insulin (CMS/HCC) TYPE 2 DIABETES MELLITUS 07/12/2016   . COPD exacerbation (CMS/HCC) ACUTE EXACERBATION OF CHRONIC OBSTRUCTIVE AIRWAYS DISEASE 07/12/2016   . Sepsis due to  undetermined organism (CMS/HCC) SEPSIS 07/12/2016   . Fever FEVER 07/12/2016       26 minutes

## 2016-08-01 NOTE — Progress Notes (Signed)
PHARMACIST PROGRESS NOTE     The following pharmacy services were provided: renal dosing    SUBJECTIVE / OBJECTIVE     Evan [name] is a 71 y.o. Male admitted on 07/11/2016.    ASSESSMENT       PLAN       Renal Dosing  Meds do not require adjustment at this time.           Signed by: Drucie Ip, [facility]

## 2016-08-01 NOTE — Discharge Planning (AHS/AVS) (Addendum)
Contacted [employer] and left voice message with [employer] inquiring status of prior authorization.    SNF AUTH # [id number]

## 2016-08-01 NOTE — Progress Notes (Signed)
End of Shift Report     Significant Events Switched IV sodium bicarb to oral sodium bicarb.   Up in chair, legs to be elevated when in chair per wound care RN      Relevant   Assessment Findings Rash is resolving, needed benadryl in afternoon for itching.       Patient/Family  Questions/Concerns      DC Barriers Hopefully tomorrow depending on lab results in AM    General Medicine Patients       Is the patient voiding or having normal catheter output? yes      Is the patient eating/drinking adequately? yes      Last bowel movement?   2/13      What is the stability of patient?  MEWS and VSS      How is pain managed and controlled? (last dose) No pain        Calls made to patient care provider         New orders related to changes in patient condition?         Miscellaneous information from the shift    Dialysis Patients        When was last dialysis and were there any issues?       When is next dialysis planned any special instructions?

## 2016-08-01 NOTE — Progress Notes (Signed)
End of Shift Report     Significant Events Took over care of pt at 2345. IV started leaking. Resource RN called, new IV started.      Relevant   Assessment Findings Agree with previous RN assessment.       Patient/Family  Questions/Concerns None stated from pt.     DC Barriers Possible discharge today pending lab results   General Medicine Patients       Is the patient voiding or having normal catheter output? Yes      Is the patient eating/drinking adequately? Yes      Last bowel movement?   2/13      What is the stability of patient?  VSS      How is pain managed and controlled? (last dose) No pain meds wanted       Calls made to patient care provider   N/A      New orders related to changes in patient condition?  N/A       Miscellaneous information from the shift N/A

## 2016-08-01 NOTE — Progress Notes (Signed)
Notified that patient felt like double layer of tubigrip was too tight. Pt found up in chair with legs in dependent position. One layer of tubigrip removed from L leg. Legs elevated while up in chair. Pt stated leg felt better with single layer of tubigrip. Negative homan's sign. Pt instructed to notify staff for any future concerns. Fall and contact precautions observed.    Wound care team will return PRN.

## 2016-08-02 LAB — BASIC METABOLIC PANEL
Anion Gap: 9.8 mmol/L (ref 3.0–11.0)
BUN: 80 mg/dL — ABNORMAL HIGH (ref 6–23)
CO2 - Carbon Dioxide: 22.2 mmol/L (ref 21.0–31.0)
Calcium: 9 mg/dL (ref 8.6–10.3)
Chloride: 107 mmol/L (ref 98–111)
Creatinine: 2.15 mg/dL — ABNORMAL HIGH (ref 0.65–1.30)
GFR Estimate: 30 mL/min/1.73m*2 — ABNORMAL LOW (ref >=60)
Glucose: 187 mg/dL — ABNORMAL HIGH (ref 80–99)
Potassium: 4.4 mmol/L (ref 3.5–5.1)
Sodium: 139 mmol/L (ref 135–143)

## 2016-08-02 LAB — CBC WITH AUTO DIFFERENTIAL
Bands %: 10 % (ref 0–10)
Bands, Absolute: 1.4 10*3/ÂµL — ABNORMAL HIGH (ref 0.0–1.2)
Basophils %: 0 % (ref 0–2)
Basophils, Absolute: 0 10*3/ÂµL (ref 0.0–0.2)
Eosinophils %: 16 % — ABNORMAL HIGH (ref 0–7)
Eosinophils, Absolute: 2.3 10*3/ÂµL — ABNORMAL HIGH (ref 0.0–0.7)
HCT: 36.6 % — ABNORMAL LOW (ref 42.0–54.0)
Hemoglobin: 11.9 g/dL — ABNORMAL LOW (ref 12.0–18.0)
Lymphocytes %: 8 % — ABNORMAL LOW (ref 25–45)
Lymphocytes, Absolute: 1.1 10*3/ÂµL (ref 1.1–4.3)
MCH: 32.6 pg (ref 27.0–34.0)
MCHC: 32.5 g/dL (ref 32.0–36.0)
MCV: 100.3 fL — ABNORMAL HIGH (ref 81.0–99.0)
MPV: 10.3 fL (ref 7.4–10.4)
Monocytes %: 3 % (ref 0–12)
Monocytes, Absolute: 0.4 10*3/ÂµL (ref 0.0–1.2)
Neutrophils %: 63 % (ref 35–70)
Neutrophils, Absolute: 8.9 10*3/ÂµL — ABNORMAL HIGH (ref 1.6–7.3)
Platelet Count: 213 10*3/ÂµL (ref 150–400)
Platelet Estimate: NORMAL
RBC: 3.65 10*6/ÂµL — ABNORMAL LOW (ref 4.70–6.10)
RDW: 14.1 % (ref 11.5–14.5)
WBC: 14.1 10*3/ÂµL — ABNORMAL HIGH (ref 4.8–10.8)

## 2016-08-02 LAB — POCT GLUCOSE
POC Glucose: 201 mg/dL — ABNORMAL HIGH (ref 80–99)
POC Glucose: 193 mg/dL — ABNORMAL HIGH (ref 80–99)
POC Glucose: 181 mg/dL — ABNORMAL HIGH (ref 80–99)
POC Glucose: 251 mg/dL — ABNORMAL HIGH (ref 80–99)

## 2016-08-02 MED ORDER — insulin aspart (NOVOLOG) 100 unit/mL PEN
100 | PEN_INJECTOR | Freq: Three times a day (TID) | SUBCUTANEOUS | 0 refills | 30.00000 days | Status: AC
Start: 2016-08-02 — End: 2024-03-11

## 2016-08-02 MED ORDER — insulin glargine (LANTUS) 100 unit/mL injection
100 | Freq: Two times a day (BID) | SUBCUTANEOUS | 3.00 refills | 33.50000 days | Status: DC
Start: 2016-08-02 — End: 2023-03-21

## 2016-08-02 MED FILL — SODIUM BICARBONATE 650 MG TABLET: 650 mg | ORAL | Qty: 1

## 2016-08-02 MED FILL — ATORVASTATIN 20 MG TABLET: 20 mg | ORAL | Qty: 1

## 2016-08-02 MED FILL — METOPROLOL TARTRATE 50 MG TABLET: 50 mg | ORAL | Qty: 2

## 2016-08-02 MED FILL — HEPARIN, PORCINE (PF) 5,000 UNIT/0.5 ML SYRINGE: 5000 unit/0.5 mL | INTRAMUSCULAR | Qty: 0.5

## 2016-08-02 MED FILL — ASPIRIN 81 MG TABLET,ENTERIC COATED: 81 mg | ORAL | Qty: 1

## 2016-08-02 MED FILL — SENNA PLUS 8.6 MG-50 MG TABLET: 8.6-50 mg | ORAL | Qty: 2

## 2016-08-02 MED FILL — LANTUS U-100 INSULIN 100 UNIT/ML SUBCUTANEOUS SOLUTION: 100 [IU]/mL | SUBCUTANEOUS | Qty: 0.25

## 2016-08-02 MED FILL — DIPHENHYDRAMINE 25 MG CAPSULE: 25 mg | ORAL | Qty: 1

## 2016-08-02 NOTE — Discharge Summary (Addendum)
DISCHARGE SUMMARY   Name: Evan [name]  DOB: 21-Feb-1946 71 y.o.  MRN: [id number]  CSN: [id number]  PCP: Remi Deter [name]  LOS:  LOS: 21 days     Admit Date  07/11/2016    Discharge Date  08/02/2016    Length of stay  21 days    Discharge DIAGNOSIS  - Left lower extremity cellulitis   - Diffuse macular papular rash   - Insulin-dependent type 2 diabetes   - Acute kidney injury metabolic acidosis on chronic disease stage III   - Severe sepsis on admission secondary to cellulitis   - Leukocytosis   - Syncope   - Non-STEMI   - Elevated MCV  - Edema of lower extremities, Heart failure was ruled out  - Hypertension   - Obstructive sleep apnea on CPAP    Hospital course and management   71 year old man with morbid obesity, hypertension, sleep apnea on CPAP, chronic kidney disease stage III, type 2 diabetes. Presents to the hospital complaining of increasing weakness and fever as well as a syncopal event. EMS found hypoxia and on arrival he had a lactic acidosis of 4. Serum creatinine was elevated at 1.8 and had a positive troponin of 0.11 that peaked at 7.7. He denied chest pain at rest. He complained of weakness and coughing as well as shortness of breath. A stress test showed a moderate probability for underlying COPD but cardiology recommended medical management until acute issues are resolved. An echo cardiogram showed a right heart strain but the patient has a history of obstructive sleep apnea and obesity. An ultrasound of the lower extremity did not show evidence of DVT. A CT pulmonary angiogram showed no evidence of pulmonary emboli. He subsequently developed contrast-induced nephropathy with serum creatinine peaked at 5.1.  ?  For treatment of his left lower extremity cellulitis he initially received ceftriaxone and vancomycin for 2 days then followed by ceftriaxone and levofloxacin for one day followed by levofloxacin alone for the following 5 days. He then received ampicillin for one day followed by vancomycin for 2  days and thereafter he's received 6 days of cefazolin, and completed therapy on 2/10 for a total of 15 days of therapy.    His maculopapular rash improved to nearly resolved after completion of 5 days of prednisone 40 mg. He did have worsening CBGs and his insulin regimen was adjusted to Lantus 25 units twice a day and NovoLog 22 units 3 times a day with meals. No evidence of worsening infection in his lower extremity cellulitis resolved with the above intervention.    Early in his hospital admission he did have evidence of a non-STEMI with elevated troponin. He is being discharged with aspirin, beta blocker and statin. When he is more stable as outpatient he should get further cardiac evaluation, likely with invasive studies. These were not pursued during inpatient given his acute kidney failure which continues to improve.     Regarding his kidney failure, lisinopril and hydrochlorothiazide remain on hold. Patient's blood pressure has been easily managed with metoprolol. May consider restarting these medicines at a lower dose and titrating as needed as outpatient if clinically necessary.    His laboratories continued to trend towards normal values with a discharge WBC of 14.1. He does have an MCV that is elevated but his TSH, folic acid and B12 were within normal range. He is being discharged to a skilled nurse facility in stable hemodynamic conditions to continue with PT/OT.    Subjective/chief complaint:  No acute distress    Objective:    Vitals Current 24 Hour Min / Max      Temp    36.3 ?C (97.3 ?F)    Temp  Min: 36.3 ?C (97.3 ?F)  Max: 37.3 ?C (99.1 ?F)      BP     130/70     BP  Min: 120/72  Max: 150/70      HR    81    Pulse  Min: 55  Max: 81      RR    17    Resp  Min: 14  Max: 17      Sats      SpO2: 95 % on  L/min       SpO2  Min: 94 %  Max: 99 %      Weight    (!) 164.5 kg (362 lb 10.5 oz)  Body mass index is 47.59 kg/m?[name]    Admit: (!) 181.4 kg (400 lb)     Physical Examination  Gen: Resting  comfortably in bed  Respiratory: No wheezing, rhonchi or rales  Cardiovascular: Regular rate and rhythm. No significant murmurs.  GI: Soft without tenderness. Bowel sounds present.  Neurologic: No focal neurological deficits.  Psych: Stable mood and affect.    Discharge Medication     Patient's Discharge Medication List      TAKE these medications      Indications of Use   allopurinol 300 MG tablet  Commonly known as:  ZYLOPRIM  300 mg.      aspirin 81 MG EC tablet  81 mg.      atorvastatin 20 MG tablet  Commonly known as:  LIPITOR  TAKE ONE TABLET BY MOUTH EVERY EVENING      cyanocobalamin 1000 MCG tablet  Commonly known as:  VITAMIN B-12  Take 1,000 mcg by mouth 2 times daily.      glimepiride 4 MG tablet  Commonly known as:  AMARYL  TAKE TWO TABLETS BY MOUTH EVERY MORNING BEFORE BREAKFAST      hydroCHLOROthiazide 25 MG tablet  Commonly known as:  HYDRODIURIL  TAKE ONE TABLET BY MOUTH EVERY DAY      insulin aspart 100 unit/mL PEN  Quantity:  1 pen  Commonly known as:  NovoLOG  Inject 22 Units under the skin 3 times daily with meals.      insulin glargine 100 unit/mL injection  Quantity:  1 vial  What changed:  See the new instructions.  Commonly known as:  LANTUS  Inject 25 Units under the skin every 12 hours.      metFORMIN 1000 MG tablet  Commonly known as:  GLUCOPHAGE  TAKE ONE TABLET BY MOUTH TWICE A DAY      metoprolol tartrate 100 MG tablet  Commonly known as:  LOPRESSOR  TAKE ONE TABLET BY MOUTH TWICE A DAY      PROAIR HFA 90 mcg/actuation inhaler  Generic drug:  albuterol  INHALE ONE PUFF BY MOUTH EVERY 4 HOURS AS NEEDED      testosterone 12.5 mg/ 1.25 gram (1 %) Glpm  Commonly known as:  ANDROGEL  Apply 4 Act topically Daily.      triamcinolone 0.1 % cream  Commonly known as:  KENALOG  Apply  topically 2 times daily.      VICTOZA 2-PAK 0.6 mg/0.1 mL (18 mg/3 mL) Pnij PEN  Generic drug:  liraglutide  Inject 1.2 mg under the skin daily.   type 2  diabetes mellitus        STOP taking these medications       lisinopril 40 MG tablet  Commonly known as:  PRINIVIL,ZESTRIL     spironolactone 25 MG tablet  Commonly known as:  ALDACTONE            Disposition  SNF    follow up instructions  Follow-up with PCP     Time spent on discharge: 35 minutes    This dictation was produced using voice recognition software. Despite concurrent proofreading, please note transcription errors may be present and that these errors may not truly reflect my intent.   Electronically signed by: Sherron Ales, 08/02/2016 10:00 AM   CC Providers:    PCP  Marissa Nestle  Attending  Sherron Ales, MD  Admitting  Mindi Slicker, MD  Author  Sherron Ales

## 2016-08-02 NOTE — Discharge Planning (AHS/AVS) (Addendum)
DCP Update: Request to Dr. Neta Mends for DC disposition. If medically stable, pt may DC to [facility] today. Will need bariatric WC transport per PT recommendation. Will update.     DC orders completed, signed and appropriate to SNF. MAR reconciled w/no discrepancies. No RX's present or necessary. Bowel Care per facility protocol: Last BM 2/13. PT/OT eval & treatment ordered. PASSR signed. Dr. Neta Mends to be informed to call report per CMS. Pt agreeable to DCP: DC today to [facility] per bariatric WC transport: CMS scheduling. No other DCP needs identified.

## 2016-08-02 NOTE — Progress Notes (Signed)
End of Shift Report     Significant Events None this shift.     Relevant   Assessment Findings None this shift.      Patient/Family  Questions/Concerns None this shift.     DC Barriers Possibly today to skilled nursing facility.    General Medicine Patients       Is the patient voiding or having normal catheter output? yes      Is the patient eating/drinking adequately? yes      Last bowel movement?   07/31/2016      What is the stability of patient?  Stable.      How is pain managed and controlled? (last dose) No pain this shift.       Calls made to patient care provider   None this shift.      New orders related to changes in patient condition?  None this shift.       Miscellaneous information from the shift

## 2016-08-02 NOTE — Discharge Planning (AHS/AVS) (Addendum)
08/02/16 1022   [facility]   SNF - Receiving Facility  [facility]  (RN call report to [phone])   ** Attending Contacted Accepting **  (For SNF) [facility] [phone])       Bariatric Wheelchair transport arranged for [address].  They will come to room to transport.

## 2016-08-02 NOTE — Treatment Summary (Signed)
IP PT TREATMENT NOTE     Patient:  Evan [name] / [id number] DOB:  12/20/1945 / 71 y.o. Date:  08/02/2016     Precautions  Specific mobility precautions: None     ASSESSMENT     Pt in good spirits today and hopeful for discharge to SNF.   Pt sitting upright in chair and able to stand with SBA - Good safety awareness with use of BUE's to push up off of chair.     Pt is slowly making some progress and is able to increase a small distance each day.  Today pt able to ambulate x215 feet with FWW but required several static standing breaks for pacing and breathing with increased SOB and WOB.   Dyspnea worsened towards the end of ambulatation distance and then pt began to demonstrate mild tremors to BLE's secondary to fatigue.    Pt expressed that he would like to mobilize more often but feels that he has to hurry with staff - Pt was encouraged to ambulate and take as much time as he needs so that he can continue to rehab and improve with his endurance. Pt was also encouraged to realize that he is the staff's priority and that medical stability includes physical stability to which we encourage more activity out of bed/chair daily. He was receptive to information given.    SNF for best possible outcome.   Pt most limited by impaired activity tolerance and dyspnea on exertion.       Able to be up with Nursing Team (x 1).     Discharge recommendations  Mobility Equipment needs: To be determined later   Primary recommendation : Patient would benefit from SNF/Nursing Home/Other Skilled Facility.  Recommended transport method: Wheelchair  Secondary recommendation: Patient would benefit from Home Health PT.  Recommended transport method: Wheelchair  Justification: Pt still wanting SNF at this time. Pt had 1x LOB today and was able to correct but is still experiencing general weakness. SNF would improve pt's strength and thus improve balance.      PLAN     Treatment Plan Frequency    Plan: Continue per established POC.  Pt.  demonstrates limitations which require skilled PT intervention.  1x (5 days/week).  Frequency will be increased as pt's condition or discharge plan indicates.     Tj Hilton Sinclair will remain on the Physical Therapy schedule until goals are met, or there has been a change in the Plan of Care.          Treatment Diagnosis Surgery / # Days Post-op (if applicable)    Primary Dx: Sepsis, cellulitis  Treatment Dx: Impaired mobility   /         SUBJECTIVE   Pt agreeable to treatment.  Pt is pleasant and states that he is hoping to discharge today.     Pain Level  Current status: Pain does not limit pt's ability to participate in therapy  Pain at start of session: 0 - No Pain  Pain at end of session: 0 - No Pain  Pain with activity: 2 (Some discomfort to R Heel/Foot)  Pain location: Foot     OBJECTIVE        Mental status  General Demeanor: Pleasant;Cooperative  LOC: Alert  Orientation: Oriented x 4  Directions: Follows multi-step commands  --> Follows multi-step commands: Consistently  --> Follows one-step commands: Consistently  Attention span: Appears intact  Memory: Appears intact in social/therapy situations     TREATMENT  Bed Mobility/Transfers  Bed mobility       Rolling / logroll        Transition to sitting up     To resting position      Transition to sitting down SBA     Transition to supine       Scooting     Transfers      Transition to standing From chair: SBA     Method        Gait/Stairs  Mobility - Gait (Level)  Gait (distance): 215 feet (Multiple static stands for pacing)  Gait (assist level): CGA  Assistive device: Front wheeled walker (FWW)  Endurance: Limited with activity tolerance during ambulation  Pace: Slow  Pattern: Downward gaze;Wide BOS;Shuffling;Decreased foot clearance R;Decreased foot clearance L;Decreased step length L;Decreased step length R  Surface: Level tile      Safety/Room Set-up   Mobility in room per PT: Able to be up with Nursing Team (x 1).  Call button accessible?: Yes  Phone  accessible?: Yes  Oxygen reconnected?: No, on room air  Position on arrival: In regular chair  Position on departure (upright): In regular chair;Sitter/Family present  Comments: Nursing in room for bed bath     GOALS     Patient/Caregiver goals reviewed and integrated with rehab treatment plan:      Multidisciplinary Problems (Active)        Problem: IP General Goal List - 2    Goal Priority Disciplines Outcome   Bed Mobility     PT    Description:  Pt. to perform bed mobility with min assist.    Transfers     PT    Description:  Pt. to perform all functional transfers with min. assist.    Ambulation - Levels     PT    Description:  Pt. to ambulate with min assist x 50 feet with appropriate assistive device.    Ambulation - Stairs     PT    Description:  Pt. to ascend/descend stairs with min assist, as per discharge environment.    Home Exercise Program     PT    Description:  Pt. to perform basic HEP with min assist.    Caregiver Training     PT    Description:  Patient/caregiver training will be provided, as needed to achieve goals.             Problem: Patient/Family Goal    Goal Priority Disciplines Outcome   Normal Status     PT    Description:  Pt. wants to return to previous level of function.     Home     PT    Description:  Pt. wants to return to previous living situation.    Mobility     PT    Description:  Patient wants to walk again.                      G-Codes/PSFS/FIM Scores: (if applicable)          First session   Start/Stop times: 0850 - 0920   Total time: 30 minutes      This note to serve as a Discharge Summary if Jahking Lesser is discharged from the hospital or from therapy services.     Therapist:  Verita Schneiders

## 2016-08-02 NOTE — Progress Notes (Signed)
Report called to ATF, held 20 U novolog per MD. Pt denies pain. Updated family poc.

## 2016-08-06 LAB — COMPREHENSIVE METABOLIC PANEL
ALT - Alanine Aminotransferase: 41 IU/L (ref 7–52)
AST - Aspartate Aminotransferase: 35 IU/L (ref 10–50)
Albumin/Globulin Ratio: 1.1 (ref >0.9)
Albumin: 3.7 g/dL (ref 3.5–5.0)
Alkaline Phosphatase: 66 IU/L (ref 34–104)
Anion Gap: 12.9 mmol/L — ABNORMAL HIGH (ref 3.0–11.0)
BUN: 47 mg/dL — ABNORMAL HIGH (ref 6–23)
Bilirubin Total: 0.5 mg/dL (ref 0.3–1.2)
CO2 - Carbon Dioxide: 25.1 mmol/L (ref 21.0–31.0)
Calcium: 8.7 mg/dL (ref 8.6–10.3)
Chloride: 101 mmol/L (ref 98–111)
Creatinine: 2.08 mg/dL — ABNORMAL HIGH (ref 0.65–1.30)
GFR Estimate: 32 mL/min/{1.73_m2} — ABNORMAL LOW (ref >=60)
Globulin: 3.3 g/dL (ref 2.2–3.7)
Glucose: 73 mg/dL — ABNORMAL LOW (ref 80–99)
Potassium: 4.2 mmol/L (ref 3.5–5.1)
Protein Total: 7 g/dL (ref 6.0–8.0)
Sodium: 139 mmol/L (ref 135–143)

## 2016-08-06 LAB — CBC WITHOUT DIFFERENTIAL
HCT: 35.4 % — ABNORMAL LOW (ref 42.0–54.0)
Hemoglobin: 11.4 g/dL — ABNORMAL LOW (ref 12.0–18.0)
MCH: 32.8 pg (ref 27.0–34.0)
MCHC: 32.2 g/dL (ref 32.0–36.0)
MCV: 101.9 fL — ABNORMAL HIGH (ref 81.0–99.0)
MPV: 11.2 fL — ABNORMAL HIGH (ref 7.4–10.4)
Platelet Count: 163 10*3/ÂµL (ref 150–400)
RBC: 3.47 10*6/ÂµL — ABNORMAL LOW (ref 4.70–6.10)
RDW: 13.6 % (ref 11.5–14.5)
WBC: 11.2 10*3/ÂµL — ABNORMAL HIGH (ref 4.8–10.8)

## 2016-08-14 LAB — COMPREHENSIVE METABOLIC PANEL
ALT - Alanine Aminotransferase: 24 IU/L (ref 7–52)
AST - Aspartate Aminotransferase: 18 IU/L (ref 10–50)
Albumin/Globulin Ratio: 1.2 (ref >0.9)
Albumin: 3.4 g/dL — ABNORMAL LOW (ref 3.5–5.0)
Alkaline Phosphatase: 58 IU/L (ref 34–104)
Anion Gap: 10.6 mmol/L (ref 3.0–11.0)
BUN: 41 mg/dL — ABNORMAL HIGH (ref 6–23)
Bilirubin Total: 0.4 mg/dL (ref 0.3–1.2)
CO2 - Carbon Dioxide: 27.4 mmol/L (ref 21.0–31.0)
Calcium: 9.1 mg/dL (ref 8.6–10.3)
Chloride: 102 mmol/L (ref 98–111)
Creatinine: 1.93 mg/dL — ABNORMAL HIGH (ref 0.65–1.30)
GFR Estimate: 34 mL/min/{1.73_m2} — ABNORMAL LOW (ref >=60)
Globulin: 2.8 g/dL (ref 2.2–3.7)
Glucose: 144 mg/dL — ABNORMAL HIGH (ref 80–99)
Potassium: 4 mmol/L (ref 3.5–5.1)
Protein Total: 6.2 g/dL (ref 6.0–8.0)
Sodium: 140 mmol/L (ref 135–143)

## 2016-08-14 LAB — CBC WITH AUTO DIFFERENTIAL
Basophils %: 1 % (ref 0–2)
Basophils, Absolute: 0.1 10*3/ÂµL (ref 0.0–0.2)
Eosinophils %: 12 % — ABNORMAL HIGH (ref 0–7)
Eosinophils, Absolute: 1.1 10*3/ÂµL — ABNORMAL HIGH (ref 0.0–0.7)
HCT: 30.7 % — ABNORMAL LOW (ref 42.0–54.0)
Hemoglobin: 10.2 g/dL — ABNORMAL LOW (ref 12.0–18.0)
Lymphocytes %: 13 % — ABNORMAL LOW (ref 25–45)
Lymphocytes, Absolute: 1.2 10*3/??L (ref 1.1–4.3)
MCH: 33.3 pg (ref 27.0–34.0)
MCHC: 33.3 g/dL (ref 32.0–36.0)
MCV: 99.9 fL — ABNORMAL HIGH (ref 81.0–99.0)
MPV: 9.5 fL (ref 7.4–10.4)
Monocytes %: 8 % (ref 0–12)
Monocytes, Absolute: 0.8 10*3/ÂµL (ref 0.0–1.2)
Neutrophils %: 66 % (ref 35–70)
Neutrophils, Absolute: 6.1 10*3/??L (ref 1.6–7.3)
Platelet Count: 198 10*3/ÂµL (ref 150–400)
RBC: 3.07 10*6/ÂµL — ABNORMAL LOW (ref 4.70–6.10)
RDW: 13.5 % (ref 11.5–14.5)
WBC: 9.2 10*3/ÂµL (ref 4.8–10.8)

## 2016-09-24 LAB — C3 COMPLEMENT: C3 Complement: 95 mg/dL (ref 79–152)

## 2016-09-24 LAB — C4 COMPLEMENT: C4 Complement: 21 mg/dL (ref 16–38)

## 2016-10-02 ENCOUNTER — Inpatient Hospital Stay: Payer: PRIVATE HEALTH INSURANCE | Attending: MD | Primary: DO

## 2016-10-18 NOTE — H&P (View-Only) (Signed)
[name], [name]    Date of visit:  09/19/2016  DOB:  03/14/1946    Age:  71 yrs.  Medical record number:  [id number]      Account number:  [id number]    ____________________________  CLINICAL RESUME  1.  Chronic right-sided heart failure.       a.  Transthoracic echocardiogram performed July 12, 2016 showing normal left ventricular systolic function with LVEF of 60% to 65%, severe right ventricular enlargement with decreased right ventricular systolic function and septal flattening suggesting RV volume and pressure overload, unable to estimate PA systolic pressure.  2.  Elevated troponin.       a.  The patient presented with left leg infection with elevated troponin to 7.7.        b.  Myocardial perfusion scan performed July 13, 2016 showing medium-sized fixed inferior defect with mild peri-infarct ischemia.  Left ventricular ejection fraction 74%.  3.  Chronic kidney disease, stage III.  4.  Morbid obesity, BMI 49.  5.  Gout.  6.  Hyperlipidemia.  7.  Diabetes mellitus.   8.  COPD.  9.  Hypertension.   10.  Obstructive sleep apnea.  ____________________________  ALLERGIES   Levofloxacin, Generalized rash   Neomycin, Generalized rash   Sulfa (Sulfonamide Antibiotics), Extrasystole  ____________________________  MEDICATIONS   1. allopurinol 300 mg tablet, 1 by mouth daily   2. aspirin 81 mg tablet,delayed release, 1 by mouth daily   3. atorvastatin 20 mg tablet, 1 by mouth in the evening   4. blood-glucose meter kit, Take as Directed   5. glimepiride 4 mg tablet, 2 by mouth in the morning   6. Lantus U-100 Insulin 100 unit/mL subcutaneous solution, Inject 25 units 2 times daily   7. lisinopril 20 mg tablet, 1 by mouth daily   8. metformin 500 mg tablet, 1 by mouth twice daily   9. metoprolol tartrate 50 mg tablet, 1 by mouth twice daily   10. Novolog Mix 70-30 FlexPen U-100 Insulin 100 unit/mL subcutaneous pen, Inject 3 times daily   11. ProAir HFA 90 mcg/actuation aerosol inhaler, Inhale one puff q 4 hrs prn    12. Spiriva with HandiHaler 18 mcg and inhalation capsules, Inhale 1 cap daily   13. triamcinolone acetonide 0.1 % topical cream, Apply topically to affected areas   14. Cpap -, qhs   15. ibuprofen 200 mg tablet, 2 by mouth twice daily  ____________________________  CHIEF COMPLAINTS   Followup of pulmonary hypertension, NSTEMI, abnormal MPI, right heart failure  ____________________________  HISTORY OF PRESENT ILLNESS  I had the pleasure of seeing Evan [name] in clinic today.  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  He had increasing symptoms of chills at the end of January and had told his wife that he was going to lie down in the bed.  She went in to check on him shortly thereafter and found him lying on the floor with altered mental status.  He was therefore brought to the emergency room where he was thought to be septic.  He was treated with antibiotics.  He underwent a transthoracic echocardiogram, which showed normal LV function with severe right ventricular enlargement and septal flattening.  He also had an elevated D-dimer and therefore underwent a CT angiogram of the chest and evaluation for PE.  This showed no pulmonary embolus.  There was coronary calcification on that study.    Throughout his early hospital course, his troponin trended up and peaked at 7.7.  He underwent a myocardial perfusion scan, which showed a fixed inferior defect with mild peri-infarct ischemia.  He had a BNP at the time of 85.  He developed contrast-induced nephropathy due to the CT scan.  This however, eventually improved.  He was diuresed while in the hospital and had significant weight loss.  After discharge, he spent 3 weeks at rehab and is now back living at home.  He is short of breath with walking approximately 100 feet.  He is able to mow their lawn with riding a lawn mower.  He has had mild lower extremity edema.  He  is set up to see a nephrologist in the near future.  His most recent creatinine was 1.7 with a GFR of 40.  He has still been taking an ACE inhibitor for renal protection given his diabetes.  Throughout his hospital stay and since he has had no symptoms of chest pain, no other symptoms of PND or orthopnea.  No syncope or lightheadedness.  ____________________________  PAST HISTORY   Past Medical Illnesses:  hypertension, sleep apnea, cellulitis, DM, kidney disease stage III, severe sepsis, leukocytosis, syncope, pulmonary emboli, COPD, mixed hyperlipidemia;  Cardiovascular Illnesses:  heart failure, MI;  Infectious Diseases:  no previous history of significant infectious diseases;  Surgical Procedures:  cataract extraction OU, hernia repair, big toes surgery;  Trauma History:  no previous history of significant trauma;  Left Ventricular Ejection Fraction:  LVEF of 74% documented via nuclear study on 07/13/2016  ;     _____________________________  FAMILY HISTORY  Father -- Not known - Adopted  Mother -- Not known - Adopted  ____________________________  CARDIAC RISK FACTORS   Tobacco Abuse: negative;  Family History of Heart Disease: unknown adopted;  Hyperlipidemia: positive;  Hypertension: positive;  Diabetes Mellitus: positive;  Prior History of Heart Disease: negative;  Obesity: positive;  Sedentary Life Style: sedentary lifestyle;  Age: negative;    ____________________________  SOCIAL HISTORY  Alcohol Use:  denies drinking;  Smoking:  used to smoke but quit 1 ppd, 40 pack year history 1 ppd;  Diet:  caffeine use-1-2 per day, low salt and low carbs diet;  Lifestyle:  married;  Exercise:  no regular exercise;  Occupation:  retired and bus mechanic [employer];  Illicit Drug Use:  denies the abuse of prescription or nonprescription drugs;  Residence:  lives with wife;    ____________________________  REVIEW OF SYSTEMS  General:  denies recent fevers, unintentional weight loss, or fatigue.  Integumentary:  rash  Eyes:   denies new blind spots, double vision.  Ears, Nose, Throat, Mouth:  nasal congestion  Respiratory:  recent cough  Cardiovascular:  edema of the legs, dyspnea at rest, dyspnea on exertion  Abdominal:  constipation  Musculoskeletal:  arthritis, muscle aches  Neurological:  numbness feet  Psychiatric:  denies any recent history of depression.  Endocrine:  denies polydipsia.  Hematological/Immunologic:  denies excessive bleeding or bruising.  ____________________________  PHYSICAL EXAMINATION    VITAL SIGNS   Blood Pressure:    142/78 Sitting, Right arm, large cuff    142/76 Sitting, Left arm, large cuff    122/72 Standing, Left arm, large cuff     Pulse:  74/min.   Weight:  377.90 lbs.   Height:  73   O2Sat:  92%   Waist Circumference:  55   BMI: 49    Constitutional:  alert, oriented, well developed  Skin:    No rash., warm and dry to touch  Head:  normocephalic  Eyes:  EOMS Intact, conjunctivae and lids unremarkable  ENT:  ears unremarkable, throat clear  Neck:  No jugular vein distention. No carotid bruit  Chest:  Bilaterally clear to auscultation. No crackles or wheezing.  Cardiac:  regular rhythm, S1 normal, S2 normal, No S3 or S4, apical impulse not displaced, no murmurs, no rubs detected  Abdomen:  abdomen soft, non-tender  Peripheral Pulses:  radial pulse(s) 2+  Extremities & Back:  no clubbing, no cyanosis, no erythema, no edema, normal muscle strength and tone  Psychiatric:  appropriate mood, memory and judgment  Neurological:  no gross motor or sensory deficits noted  ____________________________  DIAGNOSTIC STUDIES  EKG performed today, personally reviewed, shows sinus rhythm with a heart rate of 73.  There is a right bundle-branch block with nonspecific ST-T wave changes and borderline inferior Q-waves.  ____________________________  LABORATORY DATA  Laboratory studies performed August 20, 2016, show hemoglobin 11.2, platelets 263, potassium 4.1, creatinine 1.7, GFR 40, BNP 85.     Laboratory studies December 01, 2015, showed total cholesterol 126, HDL 40, LDL 45, triglycerides 207.  ____________________________  IMPRESSIONS/PLAN  1.  Morbid obesity with BMI of 49.  His life threatening illness in the end of January while related to sepsis with a likely source of lower extremity cellulitis was indirectly related to his underlying morbid obesity.  He has most likely significant pulmonary hypertension as a results of longstanding sleep apnea and obesity and his echocardiogram at the time of his event showed significant right-sided heart failure.  This most likely has led to edema of his lower extremities, which predisposes him towards developing infection.  Therefore, we spent the majority of time counseling regarding a healthy diet and weight loss strategies.  2.  Elevated troponin.  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  Therefore, I have recommended undergoing a coronary angiogram.  He did develop significant contrast-induced nephropathy at the time of CT angiogram while he was very sick.  I think that his risk is likely not as high this time given he is not as critically ill.  However, he will have both his metformin and lisinopril held in advance.  He will also get postprocedure fluid administration.  The procedure will be performed from the right wrist.  He will also undergo a right heart catheterization at the time to evaluate for pulmonary hypertension.  3.  Pulmonary hypertension, as above.  4.  Hyperlipidemia, well controlled on atorvastatin.  5.  Hypertension, well controlled on lisinopril.     It was a pleasure to see Evan [name] in clinic today.  If you have further questions or concerns, please do not hesitate to contact me.  ____________________________  TODAYS  ORDERS  Allergy Review  Medications Reconciliation  Adv Care Plan Discussed no care plan Today  Diet mgmt edu, guidance and counseling TODAY  Smoking effects education TODAY                           Referring Physician: [name], SAMUEL        Nathan Funk, MD, FACC  NF / iaru     *The documentation of this record was   generated with the assistance of voice recognition software and some words may be phonetically similar but different from what was actually dictated.    This H&P Note was originally documented in a third party system and entered into Epic by Dawn Wothe on behalf of Nathan Funk, MD.

## 2016-10-18 NOTE — H&P (Signed)
Evan [name]    Date of visit:  09/19/2016  DOB:  05-07-1946    Age:  71 yrs.  Medical record number:  [id number]      Account number:  [id number]    ____________________________  CLINICAL RESUME  1.  Chronic right-sided heart failure.       a.  Transthoracic echocardiogram performed July 12, 2016 showing normal left ventricular systolic function with LVEF of 60% to 65%, severe right ventricular enlargement with decreased right ventricular systolic function and septal flattening suggesting RV volume and pressure overload, unable to estimate PA systolic pressure.  2.  Elevated troponin.       a.  The patient presented with left leg infection with elevated troponin to 7.7.        b.  Myocardial perfusion scan performed July 13, 2016 showing medium-sized fixed inferior defect with mild peri-infarct ischemia.  Left ventricular ejection fraction 74%.  3.  Chronic kidney disease, stage III.  4.  Morbid obesity, BMI 49.  5.  Gout.  6.  Hyperlipidemia.  7.  Diabetes mellitus.   8.  COPD.  9.  Hypertension.   10.  Obstructive sleep apnea.  ____________________________  ALLERGIES   Levofloxacin, Generalized rash   Neomycin, Generalized rash   Sulfa (Sulfonamide Antibiotics), Extrasystole  ____________________________  MEDICATIONS   1. allopurinol 300 mg tablet, 1 by mouth daily   2. aspirin 81 mg tablet,delayed release, 1 by mouth daily   3. atorvastatin 20 mg tablet, 1 by mouth in the evening   4. blood-glucose meter kit, Take as Directed   5. glimepiride 4 mg tablet, 2 by mouth in the morning   6. Lantus U-100 Insulin 100 unit/mL subcutaneous solution, Inject 25 units 2 times daily   7. lisinopril 20 mg tablet, 1 by mouth daily   8. metformin 500 mg tablet, 1 by mouth twice daily   9. metoprolol tartrate 50 mg tablet, 1 by mouth twice daily   10. Novolog Mix 70-30 FlexPen U-100 Insulin 100 unit/mL subcutaneous pen, Inject 3 times daily   11. ProAir HFA 90 mcg/actuation aerosol inhaler, Inhale one puff q 4 hrs prn    12. Spiriva with HandiHaler 18 mcg and inhalation capsules, Inhale 1 cap daily   13. triamcinolone acetonide 0.1 % topical cream, Apply topically to affected areas   14. Cpap -, qhs   15. ibuprofen 200 mg tablet, 2 by mouth twice daily  ____________________________  CHIEF COMPLAINTS   Followup of pulmonary hypertension, NSTEMI, abnormal MPI, right heart failure  ____________________________  HISTORY OF PRESENT ILLNESS  I had the pleasure of seeing Evan [name] in clinic today.  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  He had increasing symptoms of chills at the end of January and had told his wife that he was going to lie down in the bed.  She went in to check on him shortly thereafter and found him lying on the floor with altered mental status.  He was therefore brought to the emergency room where he was thought to be septic.  He was treated with antibiotics.  He underwent a transthoracic echocardiogram, which showed normal LV function with severe right ventricular enlargement and septal flattening.  He also had an elevated [name]-dimer and therefore underwent a CT angiogram of the chest and evaluation for PE.  This showed no pulmonary embolus.  There was coronary calcification on that study.  Throughout his early hospital course, his troponin trended up and peaked at 7.7.  He underwent a myocardial perfusion scan, which showed a fixed inferior defect with mild peri-infarct ischemia.  He had a BNP at the time of 85.  He developed contrast-induced nephropathy due to the CT scan.  This however, eventually improved.  He was diuresed while in the hospital and had significant weight loss.  After discharge, he spent 3 weeks at rehab and is now back living at home.  He is short of breath with walking approximately 100 feet.  He is able to mow their lawn with riding a Surveyor, mining.  He has had mild lower extremity edema.  He  is set up to see a nephrologist in the near future.  His most recent creatinine was 1.7 with a GFR of 40.  He has still been taking an ACE inhibitor for renal protection given his diabetes.  Throughout his hospital stay and since he has had no symptoms of chest pain, no other symptoms of PND or orthopnea.  No syncope or lightheadedness.  ____________________________  PAST HISTORY   Past Medical Illnesses:  hypertension, sleep apnea, cellulitis, DM, kidney disease stage III, severe sepsis, leukocytosis, syncope, pulmonary emboli, COPD, mixed hyperlipidemia;  Cardiovascular Illnesses:  heart failure, MI;  Infectious Diseases:  no previous history of significant infectious diseases;  Surgical Procedures:  cataract extraction OU, hernia repair, big toes surgery;  Trauma History:  no previous history of significant trauma;  Left Ventricular Ejection Fraction:  LVEF of 74% documented via nuclear study on 07/13/2016  ;     _____________________________  FAMILY HISTORY  Father -- Not known - Adopted  Mother -- Not known - Adopted  ____________________________  CARDIAC RISK FACTORS   Tobacco Abuse: negative;  Family History of Heart Disease: unknown adopted;  Hyperlipidemia: positive;  Hypertension: positive;  Diabetes Mellitus: positive;  Prior History of Heart Disease: negative;  Obesity: positive;  Sedentary Life Style: sedentary lifestyle;  Age: negative;    ____________________________  SOCIAL HISTORY  Alcohol Use:  denies drinking;  Smoking:  used to smoke but quit 1 ppd, 40 pack year history 1 ppd;  Diet:  caffeine use-1-2 per day, low salt and low carbs diet;  Lifestyle:  married;  Exercise:  no regular exercise;  Occupation:  retired and Human resources officer [employer];  Illicit Drug Use:  denies the abuse of prescription or nonprescription drugs;  Residence:  lives with wife;    ____________________________  REVIEW OF SYSTEMS  General:  denies recent fevers, unintentional weight loss, or fatigue.  Integumentary:  rash  Eyes:   denies new blind spots, double vision.  Ears, Nose, Throat, Mouth:  nasal congestion  Respiratory:  recent cough  Cardiovascular:  edema of the legs, dyspnea at rest, dyspnea on exertion  Abdominal:  constipation  Musculoskeletal:  arthritis, muscle aches  Neurological:  numbness feet  Psychiatric:  denies any recent history of depression.  Endocrine:  denies polydipsia.  Hematological/Immunologic:  denies excessive bleeding or bruising.  ____________________________  PHYSICAL EXAMINATION    VITAL SIGNS   Blood Pressure:    142/78 Sitting, Right arm, large cuff    142/76 Sitting, Left arm, large cuff    122/72 Standing, Left arm, large cuff     Pulse:  74/min.   Weight:  377.90 lbs.   Height:  73   O2Sat:  92%   Waist Circumference:  55   BMI: 49    Constitutional:  alert, oriented, well developed  Skin:  No rash., warm and dry to touch  Head:  normocephalic  Eyes:  EOMS Intact, conjunctivae and lids unremarkable  ENT:  ears unremarkable, throat clear  Neck:  No jugular vein distention. No carotid bruit  Chest:  Bilaterally clear to auscultation. No crackles or wheezing.  Cardiac:  regular rhythm, S1 normal, S2 normal, No S3 or S4, apical impulse not displaced, no murmurs, no rubs detected  Abdomen:  abdomen soft, non-tender  Peripheral Pulses:  radial pulse(s) 2+  Extremities & Back:  no clubbing, no cyanosis, no erythema, no edema, normal muscle strength and tone  Psychiatric:  appropriate mood, memory and judgment  Neurological:  no gross motor or sensory deficits noted  ____________________________  DIAGNOSTIC STUDIES  EKG performed today, personally reviewed, shows sinus rhythm with a heart rate of 73.  There is a right bundle-branch block with nonspecific ST-T wave changes and borderline inferior Q-waves.  ____________________________  LABORATORY DATA  Laboratory studies performed August 20, 2016, show hemoglobin 11.2, platelets 263, potassium 4.1, creatinine 1.7, GFR 40, BNP 85.     Laboratory studies December 01, 2015, showed total cholesterol 126, HDL 40, LDL 45, triglycerides 207.  ____________________________  IMPRESSIONS/PLAN  1.  Morbid obesity with BMI of 49.  His life threatening illness in the end of January while related to sepsis with a likely source of lower extremity cellulitis was indirectly related to his underlying morbid obesity.  He has most likely significant pulmonary hypertension as a results of longstanding sleep apnea and obesity and his echocardiogram at the time of his event showed significant right-sided heart failure.  This most likely has led to edema of his lower extremities, which predisposes him towards developing infection.  Therefore, we spent the majority of time counseling regarding a healthy diet and weight loss strategies.  2.  Elevated troponin.  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  Therefore, I have recommended undergoing a coronary angiogram.  He did develop significant contrast-induced nephropathy at the time of CT angiogram while he was very sick.  I think that his risk is likely not as high this time given he is not as critically ill.  However, he will have both his metformin and lisinopril held in advance.  He will also get postprocedure fluid administration.  The procedure will be performed from the right wrist.  He will also undergo a right heart catheterization at the time to evaluate for pulmonary hypertension.  3.  Pulmonary hypertension, as above.  4.  Hyperlipidemia, well controlled on atorvastatin.  5.  Hypertension, well controlled on lisinopril.     It was a pleasure to see Shulem Mader in clinic today.  If you have further questions or concerns, please do not hesitate to contact me.  ____________________________  Christianne [name]  Allergy Review  Medications Reconciliation  Adv Care Plan Discussed no care plan Today  Diet mgmt edu, guidance and counseling TODAY  Smoking effects education TODAY                           Referring Physician: [name], Jed Limerick, MD, [facility]  NF / iaru     *The documentation of this record was  generated with the assistance of voice recognition software and some words may be phonetically similar but different from what was actually dictated.    This H&P Note was originally documented in a third party system and entered into Epic by [employer] on behalf of Glendale Chard, MD.

## 2016-11-15 ENCOUNTER — Inpatient Hospital Stay: Admit: 2016-11-15 | Payer: PRIVATE HEALTH INSURANCE | Attending: MD | Primary: DO

## 2016-11-15 DIAGNOSIS — I214 Non-ST elevation (NSTEMI) myocardial infarction: Secondary | ICD-10-CM

## 2016-11-15 MED ORDER — sodium chloride (NS) 0.9% bolus 250 mL
INTRAVENOUS | Status: DC | PRN
Start: 2016-11-15 — End: 2016-11-16

## 2016-11-15 MED ORDER — atropine syringe 0.5 mg
0.1 | INTRAMUSCULAR | Status: DC | PRN
Start: 2016-11-15 — End: 2016-11-16

## 2016-11-15 MED ORDER — iodixanol (VISIPAQUE) 320 mg iodine/mL injection 50 mL
320 | INTRAVENOUS | Status: DC
Start: 2016-11-15 — End: 2016-11-16

## 2016-11-15 MED ORDER — acetaminophen (TYLENOL) tablet 650 mg
325 | ORAL | Status: DC | PRN
Start: 2016-11-15 — End: 2016-11-16

## 2016-11-15 MED ORDER — furosemide (LASIX) 20 MG tablet
20 | ORAL_TABLET | Freq: Every day | ORAL | 11 refills | Status: DC
Start: 2016-11-15 — End: 2021-10-11

## 2016-11-15 MED ORDER — sodium chloride 0.9 % (NS) syringe 5 mL
Freq: Three times a day (TID) | INTRAMUSCULAR | Status: DC
Start: 2016-11-15 — End: 2016-11-16

## 2016-11-15 MED ORDER — nitroglycerin (NITROSTAT) SL tablet 0.4 mg
0.4 | SUBLINGUAL | Status: DC | PRN
Start: 2016-11-15 — End: 2016-11-16

## 2016-11-15 MED ORDER — sodium chloride 0.9 % (NS) syringe 5 mL
INTRAMUSCULAR | Status: DC | PRN
Start: 2016-11-15 — End: 2016-11-16

## 2016-11-15 MED ORDER — hydrALAZINE (APRESOLINE) injection
20 | INTRAMUSCULAR | Status: DC | PRN
Start: 2016-11-15 — End: 2016-11-16
  Administered 2016-11-15: 15:00:00 20 mg/mL via INTRAVENOUS

## 2016-11-15 MED ORDER — fentaNYL citrate (PF) (SUBLIMAZE) 100 mcg/2 mL (50 mcg/mL) syringe
100 | INTRAVENOUS | Status: DC | PRN
Start: 2016-11-15 — End: 2016-11-16
  Administered 2016-11-15: 15:00:00 100 mcg/2 mL (50 mcg/mL) via INTRAVENOUS

## 2016-11-15 MED ORDER — HYDROcodone-acetaminophen (NORCO) 5-325 mg per tablet 1-2 tablet
5-325 | ORAL | Status: DC | PRN
Start: 2016-11-15 — End: 2016-11-16

## 2016-11-15 MED ORDER — aspirin 81 MG chewable tablet 81 mg
81 | Freq: Every day | ORAL | Status: DC
Start: 2016-11-15 — End: 2016-11-16
  Administered 2016-11-15: 14:00:00 81 mg via ORAL

## 2016-11-15 MED ORDER — diphenhydrAMINE (BENADRYL) capsule 25 mg
25 | ORAL | Status: AC
Start: 2016-11-15 — End: 2016-11-15

## 2016-11-15 MED ORDER — labetalol (NORMODYNE,TRANDATE) injection 10-20 mg
5 | INTRAVENOUS | Status: DC | PRN
Start: 2016-11-15 — End: 2016-11-16

## 2016-11-15 MED ORDER — heparin 2000 Units in NaCl 0.9 % 1000 mL (2 Units/mL)
1000 | INTRAMUSCULAR | Status: DC | PRN
Start: 2016-11-15 — End: 2016-11-16
  Administered 2016-11-15: 14:00:00

## 2016-11-15 MED ORDER — lidocaine (PF) 1 % (XYLOCAINE) injection
10 | INTRAMUSCULAR | Status: DC | PRN
Start: 2016-11-15 — End: 2016-11-16
  Administered 2016-11-15: 14:00:00 10 mg/mL (1 %) via SUBCUTANEOUS

## 2016-11-15 MED ORDER — midazolam (PF) (VERSED) injection
1 | INTRAMUSCULAR | Status: DC | PRN
Start: 2016-11-15 — End: 2016-11-16
  Administered 2016-11-15: 14:00:00 1 mg/mL via INTRAVENOUS

## 2016-11-15 MED ORDER — hydrALAZINE (APRESOLINE) injection 10 mg
20 | INTRAMUSCULAR | Status: DC | PRN
Start: 2016-11-15 — End: 2016-11-16

## 2016-11-15 NOTE — Brief Op Note (Signed)
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 50% mid LAD stenosis. LVEDP 20 mmHg. Mean PA 25. CO 7.5 l/min    Complications:  None    Disposition:  CVR - In stable condition

## 2016-11-15 NOTE — Discharge Instructions (Signed)
FOLLOW UP APPOINTMENT    Dr Alfred Levins Tuesday June 26th check in at 1030    _______________________________________________________________    POST CATHETERIZATION DISCHARGE EDUCATION    1. Nutrition and diet  . Resume your usual diet and drink 1-2 quarts of fluids over the next 12 hours, unless   you are on a fluid restriction  . Diet : Resume normal diet    2. Activity  . Rest today. Tomorrow you may resume your normal activities, except no heavy exertion and no lifting over 10 pounds for 2-3 days  . No driving for 24 hours  . You may shower 24 hours after the procedure, but do not scrub the puncture site. Pat dry    3. Puncture site care (For groin and arm/wrist puncture sites)  . Keep puncture site clean and dry, may remove band aid/bandage after 24 hours  . Monitor puncture site (groin or arm/wrist) for the next 2 days for and notify your Physician immediately if you experience:  . Increased bruising or pain at site (Groin or arm/wrist)  . Sudden Bleeding or Drainage - apply pressure one finger width above the site & go to nearest hospital  . Swelling at site that is firm and larger than a walnut  . Numbness, tingling or pain below puncture site which could be serious & require going to nearest ER   In leg puncture sites: down to toes that is unexplained   In arm puncture sites: below elbow that is unexplained   In wrist puncture sites: In hand below wrist that is unexplained  . Notify your physician of any signs of infection, fever, or chills.  Gavin Potters your physician if you experience significant chest or heart related discomfort and/or call 911    NEVER DRIVE YOURSELF TO THE HOSPITAL, HAVE SOMEONE ELSE DRIVE YOU OR CALL 161.    Contact numbers:  o [facility] [phone]  o [facility] [phone]  o [facility] @ [facility] - [phone]  o [facility]   [place] office: [phone]   [place] office: [phone]  o  [facility]: [phone]

## 2016-11-15 NOTE — Interval H&P Note (Signed)
I have assessed the patient.    No interval change in H&P.    Outpatient Patient Class Confirmed.

## 2017-07-07 ENCOUNTER — Other Ambulatory Visit: Payer: Self-pay

## 2017-07-07 ENCOUNTER — Emergency Department (INDEPENDENT_AMBULATORY_CARE_PROVIDER_SITE_OTHER)
Admission: EM | Admit: 2017-07-07 | Discharge: 2017-07-07 | Disposition: A | Discharge status: Home / Self Care | Payer: Medicare HMO | Source: Home / Self Care | Attending: Family Medicine | Admitting: Family Medicine

## 2017-07-07 ENCOUNTER — Encounter: Payer: Self-pay | Admitting: Emergency Medicine

## 2017-07-07 DIAGNOSIS — H9201 Otalgia, right ear: Secondary | ICD-10-CM

## 2017-07-07 DIAGNOSIS — J029 Acute pharyngitis, unspecified: Secondary | ICD-10-CM

## 2017-07-07 HISTORY — DX: Hyperlipidemia, unspecified: E78.5

## 2017-07-07 HISTORY — DX: Essential (primary) hypertension: I10

## 2017-07-07 MED ORDER — AMOXICILLIN 500 MG PO CAPS: 500.0000 mg | ORAL_CAPSULE | Freq: Three times a day (TID) | ORAL | 0 refills | Status: DC

## 2017-07-07 NOTE — ED Provider Notes (Signed)
[facility]    CSN: [id number] Arrival date & time: 07/07/17  1605     History   Chief Complaint Chief Complaint  Patient presents with  . Otalgia    HPI Glenn Lopez is a 72 y.o. male.   The history is provided by the patient. No language interpreter was used.  Otalgia  Location:  Right Behind ear:  No abnormality Quality:  Aching Severity:  Moderate Onset quality:  Gradual Duration:  2 days Timing:  Constant Progression:  Worsening Chronicity:  New Relieved by:  Nothing Worsened by:  Nothing Ineffective treatments:  None tried Associated symptoms: sore throat   Risk factors: no chronic ear infection   Pt reports his throat is sore and he has pain in his right ear and the side of his neck  Past Medical History:  Diagnosis Date  . Hyperlipidemia   . Hypertension     There are no active problems to display for this patient.   Past Surgical History:  Procedure Laterality Date  . HERNIA REPAIR         Home Medications    Prior to Admission medications   Medication Sig Start Date End Date Taking? Authorizing Provider  amoxicillin (AMOXIL) 500 MG capsule Take 1 capsule (500 mg total) by mouth 3 (three) times daily. 07/07/17   Elson Areas, PA-C    Family History History reviewed. No pertinent family history.  Social History Social History   Tobacco Use  . Smoking status: Never Smoker  . Smokeless tobacco: Never Used  Substance Use Topics  . Alcohol use: Yes  . Drug use: Not on file     Allergies   Patient has no known allergies.   Review of Systems Review of Systems  HENT: Positive for ear pain and sore throat.   All other systems reviewed and are negative.    Physical Exam Triage Vital Signs ED Triage Vitals  Enc Vitals Group     BP 07/07/17 1650 (!) 165/72     Pulse Rate 07/07/17 1650 60     Resp 07/07/17 1650 16     Temp 07/07/17 1650 97.9 F (36.6 C)     Temp Source 07/07/17 1650 Oral     SpO2 07/07/17  1650 99 %     Weight 07/07/17 1650 165 lb (74.8 kg)     Height 07/07/17 1650 5\' 10"  (1.778 m)     Head Circumference --      Peak Flow --      Pain Score 07/07/17 1651 7     Pain Loc --      Pain Edu? --      Excl. in GC? --    No data found.  Updated Vital Signs BP (!) 165/72 (BP Location: Right Arm)   Pulse 60   Temp 97.9 F (36.6 C) (Oral)   Resp 16   Ht 5\' 10"  (1.778 m)   Wt 165 lb (74.8 kg)   SpO2 99%   BMI 23.68 kg/m   Visual Acuity Right Eye Distance:   Left Eye Distance:   Bilateral Distance:    Right Eye Near:   Left Eye Near:    Bilateral Near:     Physical Exam  Constitutional: He appears well-developed and well-nourished.  HENT:  Head: Normocephalic.  Right Ear: External ear normal.  Left Ear: External ear normal.  Bilat t's normal Erythema thorat right side greater than left  Eyes: EOM are normal. Pupils are equal, round,  and reactive to light.  Neck: Normal range of motion.  Cardiovascular: Normal rate.  Pulmonary/Chest: Effort normal.  Musculoskeletal: Normal range of motion.  Neurological: He is alert.  Skin: Skin is warm.  Psychiatric: He has a normal mood and affect.  Nursing note and vitals reviewed.    UC Treatments / Results  Labs (all labs ordered are listed, but only abnormal results are displayed) Labs Reviewed - No data to display  EKG  EKG Interpretation None       Radiology No results found.  Procedures Procedures (including critical care time)  Medications Ordered in UC Medications - No data to display   Initial Impression / Assessment and Plan / UC Course  I have reviewed the triage vital signs and the nursing notes.  Pertinent labs & imaging results that were available during my care of the patient were reviewed by me and considered in my medical decision making (see chart for details).       Final Clinical Impressions(s) / UC Diagnoses   Final diagnoses:  Acute pharyngitis, unspecified etiology    Otalgia of right ear    ED Discharge Orders        Ordered    amoxicillin (AMOXIL) 500 MG capsule  3 times daily     07/07/17 1701       Controlled Substance Prescriptions [place] Controlled Substance Registry consulted? Not Applicable   An After Visit Summary was printed and given to the patient.    Elson AreasSofia, Phylis Javed K, [place]PA-C 07/07/17 1736

## 2017-07-07 NOTE — ED Triage Notes (Signed)
Awoke with right ear and neck gland discomfort. Took ASA at 11:30.

## 2017-07-07 NOTE — ED Notes (Signed)
Patient takes med for hypertension, hyperlipidemia; does not know names or doses; cannot get pharmacy for information.

## 2019-09-21 ENCOUNTER — Ambulatory Visit: Admit: 2019-09-21 | Payer: PRIVATE HEALTH INSURANCE | Primary: DO

## 2019-09-21 DIAGNOSIS — Z23 Encounter for immunization: Secondary | ICD-10-CM

## 2019-10-16 ENCOUNTER — Ambulatory Visit: Admit: 2019-10-16 | Payer: PRIVATE HEALTH INSURANCE | Primary: DO

## 2019-10-16 DIAGNOSIS — Z23 Encounter for immunization: Secondary | ICD-10-CM

## 2019-12-15 LAB — MICROALBUMIN/CREATININE RATIO
Microalbumin, Urine, Random: 8.9 mg/dL
Microalbumin/Creatinine Ratio: 197.8 ??g/mg creatinine — ABNORMAL HIGH (ref <30.0)

## 2020-12-20 ENCOUNTER — Inpatient Hospital Stay: Admit: 2020-12-20 | Payer: MEDICARE | Attending: FNP | Primary: DO

## 2020-12-20 DIAGNOSIS — I50812 Chronic right heart failure: Secondary | ICD-10-CM

## 2021-02-19 ENCOUNTER — Other Ambulatory Visit: Admit: 2021-02-19 | Discharge: 2021-02-19 | Payer: MEDICARE | Primary: DO

## 2021-02-19 DIAGNOSIS — Z20822 Contact with and (suspected) exposure to covid-19: Secondary | ICD-10-CM

## 2021-02-20 ENCOUNTER — Other Ambulatory Visit: Admit: 2021-02-20 | Discharge: 2021-02-20 | Payer: MEDICARE | Primary: DO

## 2021-02-20 DIAGNOSIS — Z20822 Contact with and (suspected) exposure to covid-19: Secondary | ICD-10-CM

## 2021-02-20 LAB — SARS-COV-2 (COVID-19), SCREENING/ASYMPTOMATIC UNEXPOSED: SARS-CoV-2 (COVID-19) by RT-PCR: NOT DETECTED

## 2021-03-03 NOTE — Procedures (Signed)
[facility]  [address]  [place], [place] [address]  [phone]    Sleep Studies    Evan Calton Golds, MD  Patient:  Evan [name].  AdmittingSydnee Cabal  MR #:  [id number]  LOC:  OMD   PT TYPE:     Account #:  [id number]  Adm Date:  02/23/2021   DOB:  09/21/1945      ORDERING PHYSICIAN:  Sydnee Cabal, MD.    DATE OF STUDY:  02/23/2021.    CLINICAL HISTORY:  Evan [name] is a 75 year old male, 73 inches in height, weighing 443 pounds with a body mass index of 58.  Notes document a prior diagnosis of obstructive sleep apnea syndrome with hypoxia, on nocturnal oximetry.  For the current study, no mention was made of a sleep aid.    POLYSOMNOGRAPHY:  This attended study was done according to AASM standards.  An epoch-by-epoch review of all raw data was performed.    TREATMENT ANALYSIS:  The entire study was done as a positive airway pressure titration.  Total sleep time was severely reduced at 271 minutes with a severely reduced sleep efficiency of 63%.  Sleep onset latency was severely prolonged at 44 minutes with a severely prolonged REM latency of 221 minutes.  The number of awakenings was mildly increased with a severely elevated spontaneous arousal index of 27.    Sleep staging showed an excess of stage I sleep with a normal amount of stage II sleep.  There was a robust amount of stage III sleep with REM sleep severely reduced at 7% of total sleep time.  The patient was intolerant of CPAP during the awake trial and so BiPAP was initiated at 12/8 cm H2O with a nasal mask.  A chin strap was added part way through the study.  Central events were seen without evidence of Cheyne-Stokes respiration, but along  with obstructive events and hypoxia, prompted pressure increases.    When central events persisted on BiPAP 16/12 cm H2O, adaptive servo-ventilation was started, corresponding to a CPAP pressure of 1 on the report with an EPAP of 4-15 cm H2O and pressure  support of 3-15 cm H2O.  Pressures were increased for obstructive event and hypoxia with the patient ultimately returning to BiPAP.    BiPAP was then increased for obstructive events.  On 23/18 cm H2O, supine REM sleep was observed with a normal residual AHI of 2 and an average saturation of 93%.    During the titration, the lowest recorded saturation during sleep was 83%.  23% of non-REM sleep was spent with saturations below 90%, typically on lower settings.    The periodic limb movement index was normal.    The basic rhythm was sinus.  There were no significant arrhythmias.    The patient felt his night in the sleep lab was the same as his usual night of sleep at home and in the morning, he felt he slept the same with positive airway pressure therapy and indicated he would wear it at home.    ASSESSMENT:  AXIS A:  1.  Obstructive sleep apnea syndrome, G47.33.  2.  Nocturnal hypoxia, G47.34.  3.  Correction of the above on BiPAP 23/18 cm H2O with a nasal mask and chin strap.  AXIS B:  CPAP titration.  AXIS C:  Coronary artery disease.    SUGGESTIONS FOR MANAGEMENT:  The titration is complete on BiPAP 23/18 cm H2O and, with a nasal mask and chin  strap, that would be appropriate initial therapy.  ordering of DME, appropriate followup and discussion of these results with the patient is deferred to Dr. Aura [name].    He would likely benefit from weight loss.  He should be aware of the effect of alcohol-containing beverages and sedating medications on snoring, sleep and sleep apnea.  For any future surgery, he should inform the surgical team of his diagnosis of sleep apnea.    Dictated By:  Evan Locket, MD  03/03/2021 08:52  Transcribed On:  03/03/2021 21:25   by  Betsy Pries   Job #:  [id number]  Receipt Code:  [id number]  CC:       Sydnee Cabal, MD            Evan [name], DO

## 2021-03-17 NOTE — ED Provider Notes (Signed)
[facility]     Curlene Dolphin  Emergency Department Encounter Note     [id number]            Remi Deter A. [name], DO    History     CC:   Tongue Swelling      Informant: History obtained from patient and EMS.    HPI:   Evan [name] is a 75 y.o. male who presents to the ED with tongue swelling.  Patient presents emergency department with tongue swelling which started about 4 hours prior to evaluation.  He states that it has progressed through the day.  He notes he did take some Tylenol shortly before his tongue started swelling, but has never had problems with Tylenol in the past.  He does note it has been a couple of years since he last took Tylenol.  He is on lisinopril, but denies prior history of angioedema.  No rash or pruritic lesions.    Review of Systems:   As per HPI. Otherwise, a complete HPI and 10 point review of systems was negative except as noted above.    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   PSH: He  has a past surgical history that includes hernia repair; Nasal polyp surgery (2001); Eye surgery; and Cardiac catheterization (11/15/2016).   Medications:   PTA Home Medications   Medication Sig   . albuterol (PROAIR HFA) 90 mcg/puff inhaler INHALE ONE PUFF BY MOUTH EVERY 4 HOURS AS NEEDED.   [name] amLODIPine (NORVASC) 2.5 mg tablet Take 2 tablets by mouth Daily.   [name] ammonium lactate (LAC-HYDRIN) 12% cream Apply to toes once daily..   . aspirin 81 MG EC tablet Take 81 mg by mouth Daily.   [name] atorvaSTATin (LIPITOR) 40 mg tablet TAKE ONE TABLET BY MOUTH EVERY EVENING   .  CONTOUR NEXT TEST strip USE TO TEST BLOOD SUGARS THREE TIMES A DAY   . cyanocobalamin (VITAMIN B-12) 1,000 mcg tablet Take 1,000 mcg by mouth 2 times daily.   . diclofenac (VOLTAREN) 1% GEL Apply 4 g topically 4 times daily as needed (for pain and inflammation). Apply to the affected area up to 4 times daily.   . fluticasone-umeclidinium-vilanterol (TRELEGY ELLIPTA) 200-62.5-25 mcg/puff inhaler Inhale 1 puff into the lungs Daily.   . furosemide (LASIX) 20 mg tablet Take 20 mg by mouth Daily .   . gabapentin (NEURONTIN) 300 mg capsule Take 1 capsule in the morning, 1 capsule in the afternoon and 2 capsules at night..   . glimepiride (AMARYL) 4 mg tablet Take 1 tablet by mouth every morning (before breakfast).   . hydroxychloroquine (PLAQUENIL) 200 mg tablet Take 200 mg by mouth Daily.   . insulin aspart (NOVOLOG) 100 units/mL injection INJECT 50 UNITS UNDER THE SKIN BEFORE BREAKFAST AND DINNER ALONG WITH SLIDING SCALE  ---  BG < 150: none  BG 150-200: 3 units  BG 201-250: 6 units  BG 251-300: 9 units  BG 301-350: 12 units  BG 351-400: 15 units   BG > 400: 18 units and call provider    Max 130 units daily..   . insulin glargine (LANTUS) 100 units/mL injection (vial) INJECT 70 UNITS UNDER THE SKIN EVERY MORNING AND  70 UNITS EVERY EVENING AS DIRECTED.   . Insulin Syringe-Needle U-100 (INSULIN SYRINGE 1CC/31GX5/16) 31G X 5/16 1 ML MISC USE ONE SYRINGE TWO TIMES A DAY TO INJECT INSULIN   . leflunomide (ARAVA) 20 mg tablet Take 1 tablet by mouth Daily.   [name] lisinopril (PRINIVIL,ZESTRIL) 40 MG tablet Take 1 tablet by mouth Daily.   . metoprolol tartrate (LOPRESSOR) 50 mg tablet TAKE ONE TABLET BY MOUTH TWICE A DAY   . Needle, Disp, 30G X 5/16 MISC 1 each by Does not apply route Daily.   . sodium bicarbonate 650 mg tablet    . testosterone 12.5 mg/1.25 g actuation (1%) gel APPLY 4 PUMPS TO THE SKIN EVERY DAY   . traMADol (ULTRAM) 50 mg tablet TAKE 1 TO 2 TABLETS BY MOUTH EVERY 8 HOURS AS NEEDED   . triamcinolone (KENALOG)  0.1% cream APPLY TOPICALLY TWO TIMES A DAY   . UNCODED DME Circaid Juxalite edema wraps for LE  Apply to both lower extremities daily.    Allergies:  He is allergic to neomycin-polymyxin b gu, sulfa antibiotics, and levofloxacin.   Social History:  He  reports that he quit smoking about 10 years ago. His smoking use included cigarettes. He has a 100.00 pack-year smoking history. He has never used smokeless tobacco.. He  reports that he does not currently use alcohol after a past usage of about 5.0 standard drinks per week.  Family History:   Family History   Problem Relation Age of Onset   . Other (see comment) Mother         adopted parent   . Other (see comment) Father         adopted parent   . Other (see comment) Daughter         healthy   . Other (see comment) Son         healhty        Medical records and nursing notes reviewed.      Physical Exam   VITAL SIGNS:   Triage Vitals      Date and Time Temp Pulse Resp BP SpO2 Weight User   03/17/21 1447 36.8 ?C (98.2 ?F) 66 18 160/100* 95 % 184.2 kg (406 lb)* MAH            Most recent vitals: BP (!) 160/100   Pulse 66   Temp 36.8 ?C (98.2 ?F) (Axillary)   Resp 18   Ht 1.854 m (6' 1)   Wt (!) 184.2 kg (406 lb)   SpO2 95%   BMI 53.57 kg/m?     Constitutional: No acute distress. Non-toxic appearance.  Head: Atraumatic, normocephalic.  Eyes: Pupils equal round and reactive to light, extra occular movement intact.   ENT: Mild edema of the lips specifically on the left with significant edema of the tongue most prominence on the left side with some bullae.  No sublingual tenderness.  Neck:  Supple and nontender without adenopathy.  No stridor  Chest: Lungs clear to auscultation without wheezes, rales or rhonchi.  Cardiovascular: Regular rate and rhythm without murmurs, rubs or gallops.  Abdomen: Bowel sounds normal, Soft, No tenderness, No masses, No pulsatile masses.  Skin: Warm and dry. No erythema. No rash.  No erythema or urticaria   Lymphatic: No  lymphadenopathy, no lymphedema  Back: Inspection normal, no CVAT   Extremities: No cyanosis or clubbing. No edema noted except in oropharynx as above. Distal pulses intact.   Musculoskeletal: Normal ROM, no tenderness or deformities.  Neurologic: Alert & oriented, Normal motor function, no deficits noted.  Psychiatric: Normal concentration and orientation.      ED Course and Medical Decision Making:    Initial Assessment and Plan: 75 y.o. male presented to the ED for evaluation of left greater than right tongue swelling, and he was triaged to room ER15.  While patient is concerned this may be reaction to the Tylenol he took earlier today, he has no urticaria, stridor or other findings to suggest anaphylaxis.  I think ACE angioedema is far more likely.  However, out of an abundance of caution we will cover all of our bases.  He will be given Benadryl, steroids, Pepcid as well as tranexamic acid.  While I will have a low threshold for intubation the patient denies significant progression currently.    Emergency Department Data:   Labs:  Labs Reviewed - No data to display    Imaging (past 2 days):   No results found.    Treatments and Procedures:  ED Medication Administration from 03/17/2021 1446 to 03/17/2021 1809         Date/Time Order Dose Route Action Action by     03/17/2021 1515 PDT tranexamic acid in saline IVPB 1,000 mg 0 mg Intravenous Stopped Izora Ribas, RN     03/17/2021 1505 PDT tranexamic acid in saline IVPB 1,000 mg 1,000 mg Intravenous New Bag Izora Ribas, RN     03/17/2021 1513 PDT diphenhydrAMINE (BENADRYL) injection 25 mg 25 mg Intravenous Given Izora Ribas, RN     03/17/2021 1504 PDT methylPREDNISolone sodium succinate (solu-MEDROL) 62.5 mg/mL injection 125 mg 125 mg Intravenous Given Izora Ribas, RN     03/17/2021 1519 PDT famotidine (PF) (PEPCID) injection 20 mg 20 mg Intravenous Given Izora Ribas, RN              ED COURSE/MEDICAL DECISION MAKING:  75 y.o. male presented to the  Emergency Department for evaluation of angioedema.  As above I saw no stigmata of anaphylaxis during the patient's ED stay.  Here he had significant left greater than right tongue edema.  He was given TXA with gradual improvement in his symptoms.  After multiple hours of observation he was noted to have continual gradual improvement of his tongue swelling.  He was offered admission for observation, but strongly preferred to be discharged home.  I think this is reasonable given the favorable trajectory of his tongue swelling.  However, he was instructed to immediately call 911 for any difficulty breathing or subjective increase in swelling.  He was instructed to stop taking lisinopril and contact his primary care and/or cardiologist first thing Monday morning to discuss changing to a different antihypertensive.  He will be discharged home in improved condition.    CRITICAL CARE TIME:  None    CLINICAL IMPRESSION/DIAGNOSIS:    ICD-10-CM ICD-9-CM    1. Angioedema due to angiotensin converting enzyme inhibitor (ACE-I)  T78.3XXA 995.1     T46.4X5A E942.6            DISPOSITION:  I feel the patient is stable for discharge at this time. I have discussed results, examination findings, disposition, and the treatment plan with the patient prior to discharge.  Indications for emergent reevaluation and side effects of medications were discussed and his questions were answered. He verbalized understanding and agreement with the treatment plan. Further discharge instructions were discussed by myself and the discharging nurse.    Medications and followup:  The patient was  discharged home in good condition.   Medications:   New Prescriptions    No medications on file     Follow-up: Willis Modena, DO  [address]  [address]  [place] [address]  [phone]    Schedule an appointment as soon as possible for a visit in 3 days  For re-evaulation and follow up.    Rosiland Oz, MD  [facility]  [address]  [place] [address]  [phone]    Call in 3 days  For re-evaulation and follow up.          Electronically signed by:  Sarina Ill Roena Malady, MD    Note: Portions of this chart were completed using Dragon voice recognition software, while every effort was made to proofread this document, some transcription errors may remain.       Marguerite Olea, MD  03/17/21 1812    *Some images could not be shown.

## 2021-07-10 ENCOUNTER — Emergency Department
Admission: EM | Admit: 2021-07-10 | Discharge: 2021-07-10 | Disposition: A | Discharge status: Home / Self Care | Payer: Medicare HMO | Source: Home / Self Care

## 2021-07-10 ENCOUNTER — Other Ambulatory Visit: Payer: Self-pay

## 2021-07-10 DIAGNOSIS — M2011 Hallux valgus (acquired), right foot: Secondary | ICD-10-CM

## 2021-07-10 DIAGNOSIS — M79671 Pain in right foot: Secondary | ICD-10-CM

## 2021-07-10 MED ORDER — PREDNISONE 20 MG PO TABS: ORAL_TABLET | ORAL | 0 refills | Status: DC

## 2021-07-10 NOTE — ED Provider Notes (Signed)
[facility]    CSN: [id number] Arrival date & time: 07/10/21  0855      History   Chief Complaint Chief Complaint  Patient presents with   Bunions    Bunion on right foot x5 days    HPI Glenn Lopez is a 76 y.o. male.   HPI 76 year old male presents with right foot specifically bunion pain for 5 days.  Patient reports it makes him difficult for him to walk.  Reports swelling of right foot that radiates to his right ankle.  Past Medical History:  Diagnosis Date   Hyperlipidemia    Hypertension     There are no problems to display for this patient.   Past Surgical History:  Procedure Laterality Date   cardiac catherization     HERNIA REPAIR         Home Medications    Prior to Admission medications   Medication Sig Start Date End Date Taking? Authorizing Provider  ascorbic acid (VITAMIN C) 250 MG tablet Take by mouth.   Yes [provider]  Coenzyme Q10 50 MG CAPS Take by mouth.   Yes [provider]  Cyanocobalamin 2500 MCG SUBL Place under the tongue.   Yes [provider]  hydrochlorothiazide (HYDRODIURIL) 25 MG tablet Take 1 tablet by mouth daily. 01/23/21  Yes [provider]  lisinopril (ZESTRIL) 10 MG tablet Take 1 tablet by mouth daily. 01/30/21  Yes [provider]  lovastatin (MEVACOR) 10 MG tablet Take 1 tablet by mouth at bedtime. 05/05/21  Yes [provider]  Magnesium 250 MG TABS Take by mouth.   Yes [provider]  metoprolol succinate (TOPROL-XL) 25 MG 24 hr tablet Take by mouth. 05/15/21  Yes [provider]  Omega-3 Fatty Acids (FISH OIL) 1000 MG CAPS Take by mouth.   Yes [provider]  omeprazole (PRILOSEC) 20 MG capsule Take 20 mg by mouth daily. 06/07/21  Yes [provider]  predniSONE (DELTASONE) 20 MG tablet Take 3 tabs PO daily x 5 days. 07/10/21  Yes Trevor Iha, FNP  sildenafil (REVATIO) 20 MG tablet Take 1 tablet by mouth 3  (three) times daily as needed. 04/06/21  Yes [provider]  amoxicillin (AMOXIL) 500 MG capsule Take 1 capsule (500 mg total) by mouth 3 (three) times daily. 07/07/17   Elson Areas, PA-C    Family History History reviewed. No pertinent family history.  Social History Social History   Tobacco Use   Smoking status: Never   Smokeless tobacco: Never  Substance Use Topics   Alcohol use: Yes     Allergies   Patient has no known allergies.   Review of Systems Review of Systems  Musculoskeletal:        Right foot lateral bunion pain x 5 days    Physical Exam Triage Vital Signs ED Triage Vitals  Enc Vitals Group     BP 07/10/21 0917 (!) 179/78     Pulse Rate 07/10/21 0917 61     Resp 07/10/21 0917 18     Temp 07/10/21 0917 98.3 F (36.8 C)     Temp Source 07/10/21 0917 Oral     SpO2 07/10/21 0917 100 %     Weight 07/10/21 0914 165 lb (74.8 kg)     Height 07/10/21 0914 5\' 9"  (1.753 m)     Head Circumference --      Peak Flow --      Pain Score 07/10/21 0913 2  Pain Loc --      Pain Edu? --      Excl. in GC? --    No data found.  Updated Vital Signs BP (!) 179/78 (BP Location: Right Arm)    Pulse 61    Temp 98.3 F (36.8 C) (Oral)    Resp 18    Ht 5\' 9"  (1.753 m)    Wt 165 lb (74.8 kg)    SpO2 100%    BMI 24.37 kg/m      Physical Exam Vitals and nursing note reviewed.  Constitutional:      Appearance: Normal appearance. He is normal weight.  HENT:     Mouth/Throat:     Mouth: Mucous membranes are moist.     Pharynx: Oropharynx is clear.  Eyes:     Extraocular Movements: Extraocular movements intact.     Conjunctiva/sclera: Conjunctivae normal.     Pupils: Pupils are equal, round, and reactive to light.  Cardiovascular:     Rate and Rhythm: Normal rate and regular rhythm.     Pulses: Normal pulses.     Heart sounds: Normal heart sounds.  Pulmonary:     Effort: Pulmonary effort is normal.     Breath sounds: Normal breath sounds.   Musculoskeletal:     Comments: Right foot (dorsum): Prominent hallux valgus of first MTP with moderate to significant soft tissue swelling and pes planus noted  Skin:    General: Skin is warm and dry.  Neurological:     General: No focal deficit present.     Mental Status: He is alert and oriented to person, place, and time.     UC Treatments / Results  Labs (all labs ordered are listed, but only abnormal results are displayed) Labs Reviewed - No data to display  EKG   Radiology No results found.  Procedures Procedures (including critical care time)  Medications Ordered in UC Medications - No data to display  Initial Impression / Assessment and Plan / UC Course  I have reviewed the triage vital signs and the nursing notes.  Pertinent labs & imaging results that were available during my care of the patient were reviewed by me and considered in my medical decision making (see chart for details).     MDM: 1.  Right foot pain-Rx'd Prednisone, Informed/advised patient to follow-up with [facility] podiatry (contact information above on this AVS) for possible evaluation of inserts/insoles for comforting/preventing worsening of right hallux valgus. 2.  Hallux valgus acquired, right foot  Additionally advised provided contact information for board-certified foot and ankle surgeon with [facility]. Graylin Shiver, MD.  [phone].  Patient discharged home, hemodynamically stable.  Final Clinical Impressions(s) / UC Diagnoses   Final diagnoses:  Right foot pain  Hallux valgus (acquired), right foot     Discharge Instructions      Advised patient to take medication as directed with food to completion.  Advised patient to increase daily water intake while taking this medication.  Inform patient to follow-up with [facility] podiatry (contact information above on this AVS) for possible evaluation of inserts/insoles for comforting/preventing  worsening of right hallux valgus.  Also may go to [email or web address] for shoe, right foot inserts.  Additionally advised provided contact information for board-certified foot and ankle surgeon with [facility]. Graylin Shiver, MD.  [phone].     ED Prescriptions     Medication Sig Dispense Auth. Provider   predniSONE (  DELTASONE) 20 MG tablet Take 3 tabs PO daily x 5 days. 15 tablet Trevor Ihaagan, Morton Simson, FNP      PDMP not reviewed this encounter.   Trevor IhaRagan, Lauria Depoy, FNP 07/10/21 1008

## 2021-07-10 NOTE — Discharge Instructions (Addendum)
Advised patient to take medication as directed with food to completion.  Advised patient to increase daily water intake while taking this medication.  Inform patient to follow-up with [facility] podiatry (contact information above on this AVS) for possible evaluation of inserts/insoles for comforting/preventing worsening of right hallux valgus.  Also may go to [email or web address] for shoe, right foot inserts.  Additionally advised provided contact information for board-certified foot and ankle surgeon with [facility] Graylin Shiver. Lelon Perla, MD.  [phone].

## 2021-07-10 NOTE — ED Triage Notes (Signed)
Pt states that he has a bunion on his right foot. Pt states that it makes it difficult for him to walk. Pt states that he has some swelling of his right foot that radiates to his ankle. Pt states that the bunion is on the outside of his foot.

## 2021-07-16 DIAGNOSIS — K449 Diaphragmatic hernia without obstruction or gangrene: Secondary | ICD-10-CM

## 2021-07-16 HISTORY — DX: Diaphragmatic hernia without obstruction or gangrene: K44.9

## 2021-07-27 ENCOUNTER — Other Ambulatory Visit: Payer: Self-pay

## 2021-07-27 ENCOUNTER — Ambulatory Visit: Payer: Medicare HMO | Admitting: Podiatry

## 2021-07-27 ENCOUNTER — Ambulatory Visit (INDEPENDENT_AMBULATORY_CARE_PROVIDER_SITE_OTHER): Payer: Medicare HMO

## 2021-07-27 ENCOUNTER — Encounter: Payer: Self-pay | Admitting: Podiatry

## 2021-07-27 DIAGNOSIS — M21619 Bunion of unspecified foot: Secondary | ICD-10-CM | POA: Diagnosis not present

## 2021-07-27 DIAGNOSIS — M205X1 Other deformities of toe(s) (acquired), right foot: Secondary | ICD-10-CM

## 2021-07-27 DIAGNOSIS — M21611 Bunion of right foot: Secondary | ICD-10-CM

## 2021-07-27 NOTE — Progress Notes (Signed)
°  Subjective:  Patient ID: Glenn Lopez, male    DOB: 08-05-1945,   MRN: [id number]  Chief Complaint  Patient presents with   Bunions     Right foot bunion- ongoing for 20 year , currently is not pain but at one time it was    76 y.o. male presents for concern of right foot bunion that he has had for 20 years. Relates currently he does not have pain but has had pain in the past. Relates it is swollen and red. Denies any treatment. Wondering if anything can be done for it.  Relates it flared up a week ago but has calmed down since.  . Denies any other pedal complaints. Denies n/v/f/c.   Past Medical History:  Diagnosis Date   Hyperlipidemia    Hypertension     Objective:  Physical Exam: Vascular: DP/PT pulses 2/4 bilateral. CFT <3 seconds. Normal hair growth on digits. No edema.  Skin. No lacerations or abrasions bilateral feet.  Musculoskeletal: MMT 5/5 bilateral lower extremities in DF, PF, Inversion and Eversion. Deceased ROM in DF of ankle joint. Severe HAV deformity.  No tenderness to medial eminence. Some pain with ROM of the joint. Some erythema noted.  Neurological: Sensation intact to light touch.   Assessment:   1. Bunion   2. Hallux limitus of right foot      Plan:  Patient was evaluated and treated and all questions answered. -Xrays reviewed. Severe HAV deformity with some loss of joint space to first MPJ.  -Discussed hallux limitus and bunions and  treatement options; conservative and  Surgical management; risks, benefits, alternatives discussed. All patient's questions answered. -Take ibpurofen as needed. .  -Recommend continue with good supportive shoes and inserts. Discussed supportive stiff soled shoes with wide toe box.  -Patient to return to office as needed or sooner if condition worsens.   Louann Sjogren, DPM

## 2021-09-07 DIAGNOSIS — J449 Chronic obstructive pulmonary disease, unspecified: Secondary | ICD-10-CM

## 2021-09-07 HISTORY — DX: Chronic obstructive pulmonary disease, unspecified: J44.9

## 2021-09-20 DIAGNOSIS — I6523 Occlusion and stenosis of bilateral carotid arteries: Secondary | ICD-10-CM | POA: Insufficient documentation

## 2021-10-11 MED ORDER — furosemide (LASIX) 20 mg tablet
20 | ORAL_TABLET | Freq: Every day | ORAL | 2 refills | Status: DC
Start: 2021-10-11 — End: 2022-07-12

## 2021-11-02 ENCOUNTER — Telehealth: Payer: Self-pay

## 2021-11-02 NOTE — Telephone Encounter (Signed)
NOTES SCANNED TO REFERRAL 
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
## 2021-11-16 ENCOUNTER — Ambulatory Visit: Payer: Medicare HMO | Admitting: Family Medicine

## 2021-11-22 ENCOUNTER — Ambulatory Visit (INDEPENDENT_AMBULATORY_CARE_PROVIDER_SITE_OTHER): Payer: Medicare HMO

## 2021-11-22 ENCOUNTER — Ambulatory Visit: Payer: Medicare HMO | Admitting: Cardiology

## 2021-11-22 ENCOUNTER — Encounter: Payer: Self-pay | Admitting: Cardiology

## 2021-11-22 VITALS — BP 122/60 | HR 51 | Ht 70.0 in | Wt 154.1 lb

## 2021-11-22 DIAGNOSIS — E7800 Pure hypercholesterolemia, unspecified: Secondary | ICD-10-CM | POA: Diagnosis not present

## 2021-11-22 DIAGNOSIS — R55 Syncope and collapse: Secondary | ICD-10-CM | POA: Diagnosis not present

## 2021-11-22 DIAGNOSIS — I10 Essential (primary) hypertension: Secondary | ICD-10-CM | POA: Diagnosis not present

## 2021-11-22 NOTE — Progress Notes (Unsigned)
Enrolled for [employer] to mail a ZIO XT long term holter monitor to the patients address on file.  

## 2021-11-22 NOTE — Patient Instructions (Signed)
Medication Instructions:   STOP METOPROLOL  *If you need a refill on your cardiac medications before your next appointment, please call your pharmacy*   Testing/Procedures:  Your physician has requested that you have an echocardiogram. Echocardiography is a painless test that uses sound waves to create images of your heart. It provides your doctor with information about the size and shape of your heart and how well your heart's chambers and valves are working. This procedure takes approximately one hour. There are no restrictions for this procedure. [address] FLO[address] IMAGING DEPARTMENT   ZIO XT- Long Term Monitor Instructions  Your physician has requested you wear a ZIO patch monitor for 14 days.  This is a single patch monitor. [employer] supplies one patch monitor per enrollment. Additional stickers are not available. Please do not apply patch if you will be having a Nuclear Stress Test,  Echocardiogram, Cardiac CT, MRI, or Chest Xray during the period you would be wearing the  monitor. The patch cannot be worn during these tests. You cannot remove and re-apply the  ZIO XT patch monitor.  Your ZIO patch monitor will be mailed 3 day USPS to your address on file. It may take 3-5 days  to receive your monitor after you have been enrolled.  Once you have received your monitor, please review the enclosed instructions. Your monitor  has already been registered assigning a specific monitor serial # to you.  Billing and Patient Assistance Program Information  We have supplied [employer] with any of your insurance information on file for billing purposes. [employer] offers a sliding scale Patient Assistance Program for patients that do not have  insurance, or whose insurance does not completely cover the cost of the ZIO monitor.  You must apply for the Patient Assistance Program to qualify for this discounted rate.  To apply, please call [employer] at [phone], select option 4,  select option 2, ask to apply for  Patient Assistance Program. Meredeth Ide will ask your household income, and how many people  are in your household. They will quote your out-of-pocket cost based on that information.  [employer] will also be able to set up a 57-month, interest-free payment plan if needed.  Applying the monitor   Shave hair from upper left chest.  Hold abrader disc by orange tab. Rub abrader in 40 strokes over the upper left chest as  indicated in your monitor instructions.  Clean area with 4 enclosed alcohol pads. Let dry.  Apply patch as indicated in monitor instructions. Patch will be placed under collarbone on left  side of chest with arrow pointing upward.  Rub patch adhesive wings for 2 minutes. Remove white label marked "1". Remove the white  label marked "2". Rub patch adhesive wings for 2 additional minutes.  While looking in a mirror, press and release button in center of patch. A small green light will  flash 3-4 times. This will be your only indicator that the monitor has been turned on.  Do not shower for the first 24 hours. You may shower after the first 24 hours.  Press the button if you feel a symptom. You will hear a small click. Record Date, Time and  Symptom in the Patient Logbook.  When you are ready to remove the patch, follow instructions on the last 2 pages of Patient  Logbook. Stick patch monitor onto the last page of Patient Logbook.  Place Patient Logbook in the blue and white box. Use locking tab on box and  tape box closed  securely. The blue and white box has prepaid postage on it. Please place it in the mailbox as  soon as possible. Your physician should have your test results approximately 7 days after the  monitor has been mailed back to [facility].  Call [facility] Customer Care at [phone] if you have questions regarding  your ZIO XT patch monitor. Call them immediately if you see an orange light blinking on your  monitor.  If your  monitor falls off in less than 4 days, contact our Monitor department at [phone].  If your monitor becomes loose or falls off after 4 days call [employer] at [phone] for  suggestions on securing your monitor    Follow-Up: At [facility], you and your health needs are our priority.  As part of our continuing mission to provide you with exceptional heart care, we have created designated Provider Care Teams.  These Care Teams include your primary Cardiologist (physician) and Advanced Practice Providers (APPs -  Physician Assistants and Nurse Practitioners) who all work together to provide you with the care you need, when you need it.  We recommend signing up for the patient portal called "MyChart".  Sign up information is provided on this After Visit Summary.  MyChart is used to connect with patients for Virtual Visits (Telemedicine).  Patients are able to view lab/test results, encounter notes, upcoming appointments, etc.  Non-urgent messages can be sent to your provider as well.   To learn more about what you can do with MyChart, go to [email or web address].    Your next appointment:   3 month(s)  The format for your next appointment:   In Person  Provider:   Olga Millers, MD     Important Information About Sugar

## 2021-11-27 ENCOUNTER — Ambulatory Visit (HOSPITAL_BASED_OUTPATIENT_CLINIC_OR_DEPARTMENT_OTHER)
Admission: RE | Admit: 2021-11-27 | Discharge: 2021-11-27 | Disposition: A | Discharge status: Home / Self Care | Payer: Medicare HMO | Source: Ambulatory Visit | Attending: Cardiology | Admitting: Cardiology

## 2021-11-27 DIAGNOSIS — R55 Syncope and collapse: Secondary | ICD-10-CM

## 2021-11-27 LAB — ECHOCARDIOGRAM COMPLETE
AR max vel: 2.13 cm2
AV Area VTI: 2.16 cm2
AV Area mean vel: 2.15 cm2
AV Mean grad: 10 mmHg
AV Peak grad: 20.8 mmHg
Ao pk vel: 2.28 m/s
Area-P 1/2: 4.36 cm2
S' Lateral: 2.6 cm

## 2021-11-27 NOTE — Progress Notes (Signed)
  Echocardiogram 2D Echocardiogram has been performed.  Merrie Roof F 11/27/2021, 4:06 PM

## 2021-11-28 ENCOUNTER — Encounter: Payer: Self-pay | Admitting: Cardiology

## 2021-11-28 NOTE — Telephone Encounter (Signed)
Sent message to patient   Discontinue amlodipine  per verbal order.

## 2021-11-28 NOTE — Telephone Encounter (Signed)
Sent message to  Dr Jens Som for review and input

## 2021-12-22 ENCOUNTER — Encounter: Payer: Self-pay | Admitting: *Deleted

## 2021-12-28 ENCOUNTER — Telehealth: Payer: Self-pay | Admitting: Cardiology

## 2021-12-28 NOTE — Telephone Encounter (Signed)
patient is requesting to speak with someone regarding his monitor results

## 2021-12-28 NOTE — Telephone Encounter (Signed)
Pt updated and verbalized understanding  Lewayne Bunting, MD  You 5 minutes ago (4:51 PM)    Remain off toprol  Olga Millers

## 2021-12-28 NOTE — Telephone Encounter (Signed)
-  Pt called stating he is not currently taking metoprolol.  -Per chart review, medication was discontinued on 6/7 due to bradycardia.  Will forward to MD to see if he would like pt to resume medication    Lewayne Bunting, MD  12/18/2021  2:39 PM EDT     No arrythmias to explain syncope Continue present meds including toprol Olga Millers

## 2022-01-26 ENCOUNTER — Ambulatory Visit (INDEPENDENT_AMBULATORY_CARE_PROVIDER_SITE_OTHER): Payer: Medicare HMO | Admitting: Family Medicine

## 2022-01-26 ENCOUNTER — Encounter: Payer: Self-pay | Admitting: Family Medicine

## 2022-01-26 VITALS — BP 178/65 | HR 55 | Ht 70.0 in | Wt 161.0 lb

## 2022-01-26 DIAGNOSIS — I6523 Occlusion and stenosis of bilateral carotid arteries: Secondary | ICD-10-CM | POA: Diagnosis not present

## 2022-01-26 DIAGNOSIS — L739 Follicular disorder, unspecified: Secondary | ICD-10-CM | POA: Diagnosis not present

## 2022-01-26 DIAGNOSIS — I10 Essential (primary) hypertension: Secondary | ICD-10-CM | POA: Diagnosis not present

## 2022-01-26 MED ORDER — DOXYCYCLINE HYCLATE 100 MG PO TABS: 100.0000 mg | ORAL_TABLET | Freq: Two times a day (BID) | ORAL | 0 refills | Status: AC

## 2022-01-26 NOTE — Patient Instructions (Signed)
Great to see you today! Let's continue the lisinopril 40mg  daily.  Cut back on the gatorade due to salt content.  Compression stockings may help with lightheaded feeling.  Start antibiotic for area on your back.  See me again in 1 month.

## 2022-01-28 DIAGNOSIS — L739 Follicular disorder, unspecified: Secondary | ICD-10-CM | POA: Insufficient documentation

## 2022-01-28 NOTE — Assessment & Plan Note (Signed)
Adding course of doxycycline.  If not improving we can consider biopsy.

## 2022-01-28 NOTE — Progress Notes (Signed)
Glenn Lopez - 76 y.o. male MRN [id number]  Date of birth: [birth year]  Subjective Chief Complaint  Patient presents with   Establish Care   Hypertension    HPI Glenn Lopez is a 76 year old male here today for initial visit to establish care.  He has concerns about his blood pressure.  He was having problems with hypotension and syncopal events.  Referred to cardiology who reduced his antihypertensive regimen.  Hydrochlorothiazide and amlodipine were discontinued.  He continues on lisinopril.  He has been monitoring his blood pressure closely at home.  Readings at home have been elevated.  Recently started taking 40 mg of lisinopril.  Readings this morning has been better.  He has not had any symptoms related to hypertension including chest pain, shortness of breath, palpitations, headaches or vision changes.  He has not had any further presyncopal or syncopal symptoms.  Additionally he is taking atorvastatin.  No side effects from this.    He has complaint of rash in the back of his neck area.  This been present for about 3 months.  Recently came a little more painful and reddened.  ROS:  A comprehensive ROS was completed and negative except as noted per HPI     No Known Allergies  Past Medical History:  Diagnosis Date   CAD (coronary artery disease)    COPD (chronic obstructive pulmonary disease) (HCC) 09/07/2021   GERD (gastroesophageal reflux disease)    Heart murmur    Hiatal hernia 07/16/2021   Hyperlipidemia    Hypertension     Past Surgical History:  Procedure Laterality Date   cardiac catherization     CHOLECYSTECTOMY     HERNIA REPAIR      Social History   Socioeconomic History   Marital status: Divorced    Spouse name: Not on file   Number of children: 0   Years of education: Not on file   Highest education level: Not on file  Occupational History   Occupation: Retired  Tobacco Use   Smoking status: Former    Packs/day: 0.25    Years: 15.00    Total  pack years: 3.75    Types: Cigarettes, Pipe    Quit date: 07/08/1999    Years since quitting: 22.5    Passive exposure: Past   Smokeless tobacco: Never   Tobacco comments:    None  Vaping Use   Vaping Use: Never used  Substance and Sexual Activity   Alcohol use: Yes    Alcohol/week: 5.0 standard drinks of alcohol    Types: 5 Shots of liquor per week    Comment: Occasional   Drug use: Never   Sexual activity: Not Currently  Other Topics Concern   Not on file  Social History Narrative   Not on file   Social Determinants of Health   Financial Resource Strain: Not on file  Food Insecurity: Not on file  Transportation Needs: Not on file  Physical Activity: Not on file  Stress: Not on file  Social Connections: Not on file    Family History  Problem Relation Age of Onset   Stroke Mother    Heart attack Father    Early death Brother     Health Maintenance  Topic Date Due   Hepatitis C Screening  Never done   Diabetic kidney evaluation - GFR measurement  09/09/2007   COVID-19 Vaccine (3 - Moderna risk series) 12/26/2019   INFLUENZA VACCINE  01/16/2022   Diabetic kidney evaluation - Urine ACR  08/18/2022   TETANUS/TDAP  09/02/2024   Pneumonia Vaccine 23+ Years old  Completed   Zoster Vaccines- Shingrix  Completed   HPV VACCINES  Aged Out     ----------------------------------------------------------------------------------------------------------------------------------------------------------------------------------------------------------------- Physical Exam BP (!) 178/65 (BP Location: Left Arm, Patient Position: Sitting, Cuff Size: Normal)   Pulse (!) 55   Ht 5\' 10"  (1.778 m)   Wt 161 lb (73 kg)   SpO2 98%   BMI 23.10 kg/m   Physical Exam Constitutional:      Appearance: Normal appearance.  Eyes:     General: No scleral icterus. Cardiovascular:     Rate and Rhythm: Normal rate and regular rhythm.  Pulmonary:     Effort: Pulmonary effort is normal.      Breath sounds: Normal breath sounds.  Musculoskeletal:     Cervical back: Neck supple.  Skin:    Comments: Erythematous patch on the back of the neck.  There are few pustules in this area.  Neurological:     Mental Status: He is alert.  Psychiatric:        Mood and Affect: Mood normal.        Behavior: Behavior normal.     ------------------------------------------------------------------------------------------------------------------------------------------------------------------------------------------------------------------- Assessment and Plan  Benign essential hypertension Blood pressure is elevated in clinic today.  Readings at home have been elevated.  I agree with change of lisinopril to 40 mg daily.  Continue to monitor readings at home.  Bilateral carotid artery stenosis Continues on atorvastatin for history of hyperlipidemia as well.  Continue atorvastatin.  Folliculitis Adding course of doxycycline.  If not improving we can consider biopsy.   Meds ordered this encounter  Medications   doxycycline (VIBRA-TABS) 100 MG tablet    Sig: Take 1 tablet (100 mg total) by mouth 2 (two) times daily for 10 days.    Dispense:  20 tablet    Refill:  0    Return in about 4 weeks (around 02/23/2022) for HTN.    This visit occurred during the SARS-CoV-2 public health emergency.  Safety protocols were in place, including screening questions prior to the visit, additional usage of staff PPE, and extensive cleaning of exam room while observing appropriate contact time as indicated for disinfecting solutions.

## 2022-01-28 NOTE — Assessment & Plan Note (Signed)
Continues on atorvastatin for history of hyperlipidemia as well.  Continue atorvastatin.

## 2022-01-28 NOTE — Assessment & Plan Note (Signed)
Blood pressure is elevated in clinic today.  Readings at home have been elevated.  I agree with change of lisinopril to 40 mg daily.  Continue to monitor readings at home.

## 2022-02-07 ENCOUNTER — Encounter: Payer: Self-pay | Admitting: General Practice

## 2022-02-20 NOTE — Progress Notes (Signed)
HPI: FU syncope. Previously followed by Dr. Danley Danker with [facility]. Stress echocardiogram November 2022 showed normal LV function and ischemia in the inferior/inferolateral walls by report.  Cardiac catheterization December 2022 showed 30% mid LAD and otherwise minor irregularities.  CTA March 2023 showed 50% or less stenosis in the right and left proximal internal carotid arteries.  Carotid Dopplers May 2023 technically difficult; there was suggestion of significant stenosis in the left internal carotid artery but report states "I would rely more on findings from recent CTA". Echocardiogram June 2023 showed normal LV function, grade 1 diastolic dysfunction, trace aortic insufficiency.  Monitor July 2023 showed sinus rhythm with PACs, brief PAT, PVCs, 4 and 6 beat runs of nonsustained ventricular tachycardia. Patient has had difficulties with orthostatic hypotension and syncope. Since last seen, patient denies dyspnea, chest pain, palpitations and his dizziness has resolved.  He denies syncope.  His blood pressure is running high.  Current Outpatient Medications  Medication Sig Dispense Refill   amLODipine (NORVASC) 5 MG tablet Take 1 tablet (5 mg total) by mouth daily. 90 tablet 3   ascorbic acid (VITAMIN C) 250 MG tablet Take by mouth.     aspirin EC 81 MG tablet Take 81 mg by mouth daily. Swallow whole.     atorvastatin (LIPITOR) 80 MG tablet Take 80 mg by mouth daily.     Coenzyme Q10 50 MG CAPS Take by mouth.     Cyanocobalamin (VITAMIN B 12 PO) Take by mouth.     Magnesium 250 MG TABS Take by mouth.     Omega-3 Fatty Acids (FISH OIL) 1000 MG CAPS Take by mouth.     omeprazole (PRILOSEC) 20 MG capsule Take 20 mg by mouth daily.     valsartan (DIOVAN) 160 MG tablet Take 1 tablet (160 mg total) by mouth daily. 90 tablet 3   No current facility-administered medications for this visit.     Past Medical History:  Diagnosis Date   CAD (coronary artery disease)    COPD (chronic  obstructive pulmonary disease) (HCC) 09/07/2021   GERD (gastroesophageal reflux disease)    Heart murmur    Hiatal hernia 07/16/2021   Hyperlipidemia    Hypertension     Past Surgical History:  Procedure Laterality Date   cardiac catherization     CHOLECYSTECTOMY     HERNIA REPAIR      Social History   Socioeconomic History   Marital status: Divorced    Spouse name: Not on file   Number of children: 0   Years of education: Not on file   Highest education level: Not on file  Occupational History   Occupation: Retired  Tobacco Use   Smoking status: Former    Packs/day: 0.25    Years: 15.00    Total pack years: 3.75    Types: Cigarettes, Pipe    Quit date: 07/08/1999    Years since quitting: 22.6    Passive exposure: Past   Smokeless tobacco: Never   Tobacco comments:    None  Vaping Use   Vaping Use: Never used  Substance and Sexual Activity   Alcohol use: Yes    Alcohol/week: 5.0 standard drinks of alcohol    Types: 5 Shots of liquor per week    Comment: Occasional   Drug use: Never   Sexual activity: Not Currently  Other Topics Concern   Not on file  Social History Narrative   Not on file   Social Determinants of Health  Financial Resource Strain: Not on file  Food Insecurity: Not on file  Transportation Needs: Not on file  Physical Activity: Not on file  Stress: Not on file  Social Connections: Not on file  Intimate Partner Violence: Not on file    Family History  Problem Relation Age of Onset   Stroke Mother    Heart attack Father    Early death Brother     ROS: no fevers or chills, productive cough, hemoptysis, dysphasia, odynophagia, melena, hematochezia, dysuria, hematuria, rash, seizure activity, orthopnea, PND, pedal edema, claudication. Remaining systems are negative.  Physical Exam: Well-developed well-nourished in no acute distress.  Skin is warm and dry.  HEENT is normal.  Neck is supple.  Chest is clear to auscultation with normal  expansion.  Cardiovascular exam is regular rate and rhythm.  Abdominal exam nontender or distended. No masses palpated. Extremities show no edema. neuro grossly intact  ECG-normal sinus rhythm at a rate of 60, no ST changes, left axis deviation.  Personally reviewed  A/P  1 syncope-echocardiogram shows normal LV function and monitor unrevealing.  There previously was felt to be at least a component of orthostasis and his HCTZ was discontinued and we stressed the importance of increasing hydration/sodium intake.  Beta-blocker was also discontinued due to mild bradycardia.  His symptoms have resolved.  We will continue to stress hydration and avoid beta-blockade at this point.  2 hypertension-Toprol discontinued previously due to mild bradycardia.  Blood pressure is running high and is high today.  He is presently taking valsartan 160 mg daily and his systolic is running in the 160 range.  I will add amlodipine 5 mg daily.  Follow blood pressure and adjust as needed.  I will ask him to be seen in the hypertension clinic in 2 weeks.  He will bring his blood pressure cuff for correlation.  3 hyperlipidemia-continue statin.  4 carotid artery disease-less than 50% bilaterally on previous CTA.  5 minimal coronary artery disease-noted on previous catheterization.  Continue statin.  Olga Millers, MD

## 2022-02-23 ENCOUNTER — Ambulatory Visit (INDEPENDENT_AMBULATORY_CARE_PROVIDER_SITE_OTHER): Payer: Medicare HMO | Admitting: Family Medicine

## 2022-02-23 ENCOUNTER — Encounter: Payer: Self-pay | Admitting: Family Medicine

## 2022-02-23 VITALS — BP 184/66 | HR 56 | Ht 70.0 in | Wt 162.0 lb

## 2022-02-23 DIAGNOSIS — I10 Essential (primary) hypertension: Secondary | ICD-10-CM

## 2022-02-23 MED ORDER — VALSARTAN 160 MG PO TABS: 160.0000 mg | ORAL_TABLET | Freq: Every day | ORAL | 3 refills | Status: DC

## 2022-02-23 NOTE — Patient Instructions (Signed)
Discontinue lisinopril Start valsartan.  Return in 2 weeks for nurse visit to recheck BP

## 2022-02-25 NOTE — Progress Notes (Signed)
Glenn Lopez - 76 y.o. male MRN [id number]  Date of birth: June 05, 1946  Subjective Chief Complaint  Patient presents with   Hypertension    HPI Glenn Lopez is a 76 year old male here today for follow-up of hypertension.  Reports that he is feeling well at this time.  He has been monitoring his blood pressures at home.  Continues to take lisinopril regularly at 40 mg each day.  Blood pressures at home have been elevated about 70% of the time.  He denies any symptoms of hypotension.  He has not had chest pain, shortness of breath, palpitations, headaches or vision changes.  ROS:  A comprehensive ROS was completed and negative except as noted per HPI  No Known Allergies  Past Medical History:  Diagnosis Date   CAD (coronary artery disease)    COPD (chronic obstructive pulmonary disease) (HCC) 09/07/2021   GERD (gastroesophageal reflux disease)    Heart murmur    Hiatal hernia 07/16/2021   Hyperlipidemia    Hypertension     Past Surgical History:  Procedure Laterality Date   cardiac catherization     CHOLECYSTECTOMY     HERNIA REPAIR      Social History   Socioeconomic History   Marital status: Divorced    Spouse name: Not on file   Number of children: 0   Years of education: Not on file   Highest education level: Not on file  Occupational History   Occupation: Retired  Tobacco Use   Smoking status: Former    Packs/day: 0.25    Years: 15.00    Total pack years: 3.75    Types: Cigarettes, Pipe    Quit date: 07/08/1999    Years since quitting: 22.6    Passive exposure: Past   Smokeless tobacco: Never   Tobacco comments:    None  Vaping Use   Vaping Use: Never used  Substance and Sexual Activity   Alcohol use: Yes    Alcohol/week: 5.0 standard drinks of alcohol    Types: 5 Shots of liquor per week    Comment: Occasional   Drug use: Never   Sexual activity: Not Currently  Other Topics Concern   Not on file  Social History Narrative   Not on file    Social Determinants of Health   Financial Resource Strain: Not on file  Food Insecurity: Not on file  Transportation Needs: Not on file  Physical Activity: Not on file  Stress: Not on file  Social Connections: Not on file    Family History  Problem Relation Age of Onset   Stroke Mother    Heart attack Father    Early death Brother     Health Maintenance  Topic Date Due   Hepatitis C Screening  Never done   Diabetic kidney evaluation - GFR measurement  09/09/2007   COVID-19 Vaccine (3 - Moderna risk series) 07/19/2022 (Originally 12/26/2019)   INFLUENZA VACCINE  09/16/2022 (Originally 01/16/2022)   Diabetic kidney evaluation - Urine ACR  08/18/2022   TETANUS/TDAP  09/02/2024   Pneumonia Vaccine 96+ Years old  Completed   Zoster Vaccines- Shingrix  Completed   HPV VACCINES  Aged Out     ----------------------------------------------------------------------------------------------------------------------------------------------------------------------------------------------------------------- Physical Exam BP (!) 184/66 (BP Location: Left Arm, Patient Position: Sitting, Cuff Size: Normal)   Pulse (!) 56   Ht 5\' 10"  (1.778 m)   Wt 162 lb (73.5 kg)   SpO2 98%   BMI 23.24 kg/m   Physical Exam Constitutional:  Appearance: Normal appearance.  Eyes:     General: No scleral icterus. Cardiovascular:     Rate and Rhythm: Normal rate and regular rhythm.  Pulmonary:     Effort: Pulmonary effort is normal.     Breath sounds: Normal breath sounds.  Neurological:     Mental Status: He is alert.  Psychiatric:        Mood and Affect: Mood normal.        Behavior: Behavior normal.     ------------------------------------------------------------------------------------------------------------------------------------------------------------------------------------------------------------------- Assessment and Plan  Benign essential hypertension Blood pressure remains  elevated.  Readings at home have been elevated as well.  Changing from lisinopril to valsartan 160 mg daily.  Return in approximately 2 weeks for nurse visit for BP check and BMP   Meds ordered this encounter  Medications   valsartan (DIOVAN) 160 MG tablet    Sig: Take 1 tablet (160 mg total) by mouth daily.    Dispense:  90 tablet    Refill:  3    No follow-ups on file.    This visit occurred during the SARS-CoV-2 public health emergency.  Safety protocols were in place, including screening questions prior to the visit, additional usage of staff PPE, and extensive cleaning of exam room while observing appropriate contact time as indicated for disinfecting solutions.

## 2022-02-25 NOTE — Assessment & Plan Note (Signed)
Blood pressure remains elevated.  Readings at home have been elevated as well.  Changing from lisinopril to valsartan 160 mg daily.  Return in approximately 2 weeks for nurse visit for BP check and BMP

## 2022-02-28 ENCOUNTER — Ambulatory Visit: Payer: Medicare HMO | Admitting: Cardiology

## 2022-02-28 ENCOUNTER — Encounter: Payer: Self-pay | Admitting: Cardiology

## 2022-02-28 VITALS — BP 200/81 | HR 60 | Ht 70.0 in | Wt 162.8 lb

## 2022-02-28 DIAGNOSIS — R55 Syncope and collapse: Secondary | ICD-10-CM

## 2022-02-28 DIAGNOSIS — I10 Essential (primary) hypertension: Secondary | ICD-10-CM | POA: Diagnosis not present

## 2022-02-28 DIAGNOSIS — E7800 Pure hypercholesterolemia, unspecified: Secondary | ICD-10-CM

## 2022-02-28 MED ORDER — AMLODIPINE BESYLATE 5 MG PO TABS: 5.0000 mg | ORAL_TABLET | Freq: Every day | ORAL | 3 refills | Status: DC

## 2022-02-28 NOTE — Patient Instructions (Signed)
Medication Instructions:   START AMLODIPINE 5 MG ONCE DAILY  *If you need a refill on your cardiac medications before your next appointment, please call your pharmacy*   Follow-Up: At [facility], you and your health needs are our priority.  As part of our continuing mission to provide you with exceptional heart care, we have created designated Provider Care Teams.  These Care Teams include your primary Cardiologist (physician) and Advanced Practice Providers (APPs -  Physician Assistants and Nurse Practitioners) who all work together to provide you with the care you need, when you need it.  We recommend signing up for the patient portal called "MyChart".  Sign up information is provided on this After Visit Summary.  MyChart is used to connect with patients for Virtual Visits (Telemedicine).  Patients are able to view lab/test results, encounter notes, upcoming appointments, etc.  Non-urgent messages can be sent to your provider as well.   To learn more about what you can do with MyChart, go to [email or web address].    Your next appointment:   2 week(s)  The format for your next appointment:   In Person  Provider:   Pharmacist in the hypertension clinic    Your physician recommends that you schedule a follow-up appointment in: 3 MONTHS WITH DR Jens Som

## 2022-03-01 ENCOUNTER — Telehealth: Payer: Self-pay | Admitting: Cardiology

## 2022-03-01 DIAGNOSIS — I10 Essential (primary) hypertension: Secondary | ICD-10-CM

## 2022-03-01 MED ORDER — AMLODIPINE BESYLATE 5 MG PO TABS: 5.0000 mg | ORAL_TABLET | Freq: Every day | ORAL | 3 refills | Status: DC

## 2022-03-01 NOTE — Telephone Encounter (Signed)
*  STAT* If patient is at the pharmacy, call can be transferred to refill team.   1. Which medications need to be refilled? (please list name of each medication and dose if known) need his new prescription sent to a new pharmacy Amlodipine  2. Which pharmacy/location (including street and city if local pharmacy) is medication to be sent to?[facility], [place]  3. Do they need a 30 day or 90 day supply? 90 days and refills

## 2022-03-21 ENCOUNTER — Encounter: Payer: Self-pay | Admitting: Nurse Practitioner

## 2022-03-21 ENCOUNTER — Ambulatory Visit: Payer: Medicare HMO | Attending: Cardiology | Admitting: Nurse Practitioner

## 2022-03-21 VITALS — BP 130/58 | HR 51 | Ht 70.0 in | Wt 160.4 lb

## 2022-03-21 DIAGNOSIS — I6523 Occlusion and stenosis of bilateral carotid arteries: Secondary | ICD-10-CM | POA: Diagnosis not present

## 2022-03-21 DIAGNOSIS — I10 Essential (primary) hypertension: Secondary | ICD-10-CM | POA: Diagnosis not present

## 2022-03-21 DIAGNOSIS — Z87898 Personal history of other specified conditions: Secondary | ICD-10-CM | POA: Diagnosis not present

## 2022-03-21 DIAGNOSIS — I2510 Atherosclerotic heart disease of native coronary artery without angina pectoris: Secondary | ICD-10-CM

## 2022-03-21 DIAGNOSIS — E785 Hyperlipidemia, unspecified: Secondary | ICD-10-CM

## 2022-03-21 MED ORDER — AMLODIPINE BESYLATE 2.5 MG PO TABS: 2.5000 mg | ORAL_TABLET | Freq: Every day | ORAL | 3 refills | Status: DC

## 2022-03-21 NOTE — Progress Notes (Signed)
Office Visit    Patient Name: Glenn Lopez Date of Encounter: 03/21/2022  Primary Care Provider:  Luetta Nutting, DO Primary Cardiologist:  Kirk Ruths, MD  Chief Complaint    76 year old male with a history of CAD, syncope, carotid artery disease, hypertension, hyperlipidemia, hiatal hernia, and COPD who presents for follow-up related to hypertension.  Past Medical History    Past Medical History:  Diagnosis Date   CAD (coronary artery disease)    COPD (chronic obstructive pulmonary disease) ([place]) 09/07/2021   GERD (gastroesophageal reflux disease)    Heart murmur    Hiatal hernia 07/16/2021   Hyperlipidemia    Hypertension    Past Surgical History:  Procedure Laterality Date   cardiac catherization     CHOLECYSTECTOMY     HERNIA REPAIR      Allergies  No Known Allergies  History of Present Illness    76 year old male with the above past medical history including CAD, syncope, carotid artery disease, hypertension, hyperlipidemia, hiatal hernia, and COPD.  He was previously followed by Dr. Bobbye Riggs with [facility].  Stress echo in November 2022 showed normal LV function, in the inferior/inferolateral walls by report.  Cardiac catheterization in December 2022 showed 30% mid LAD stenosis, otherwise minor irregularities.  CTA in March 2023 showed 50% or less stenosis in the right and left proximal internal carotid arteries.  Carotid dopplers in May 2023 was technically difficult, no suggestion of significant stenosis in the left ICA, but report stated "I would rely more on findings from recent CTA."   He has had difficulties with orthostatic hypotension and syncope. Echocardiogram in June 2023 showed normal LV function, G1 DD, trace aortic valve regurgitation. Outpatient cardiac monitor in July 2023 showed sinus rhythm with PACs, brief PAT, PVCs, 4-6 beat runs of nonsustained ventricular tachycardia.  He was last seen in the office on 02/28/2022 and was stable  from a cardiac standpoint.  His BP was elevated.  He was started on amlodipine 5 mg daily.  He presents today for follow-up.  Since his last visit he has been stable from a cardiac standpoint.  BP remains elevated above goal.  He has often been checking his BP prior to taking his medication.  He denies any dizziness, presyncope, syncope.  Denies symptoms concerning for angina.  Overall he reports feeling well and other than ongoing BP management, he denies any additional concerns today.   Home Medications    Current Outpatient Medications  Medication Sig Dispense Refill   amLODipine (NORVASC) 2.5 MG tablet Take 1 tablet (2.5 mg total) by mouth daily. 90 tablet 3   amLODipine (NORVASC) 5 MG tablet Take 1 tablet (5 mg total) by mouth daily. 90 tablet 3   ascorbic acid (VITAMIN C) 250 MG tablet Take by mouth.     aspirin EC 81 MG tablet Take 81 mg by mouth daily. Swallow whole.     atorvastatin (LIPITOR) 80 MG tablet Take 80 mg by mouth daily.     Coenzyme Q10 50 MG CAPS Take by mouth.     Cyanocobalamin (VITAMIN B 12 PO) Take by mouth.     Magnesium 250 MG TABS Take by mouth.     Omega-3 Fatty Acids (FISH OIL) 1000 MG CAPS Take by mouth.     omeprazole (PRILOSEC) 20 MG capsule Take 20 mg by mouth daily.     valsartan (DIOVAN) 160 MG tablet Take 1 tablet (160 mg total) by mouth daily. 90 tablet 3   No current  facility-administered medications for this visit.     Review of Systems    He denies chest pain, palpitations, dyspnea, pnd, orthopnea, n, v, dizziness, syncope, edema, weight gain, or early satiety. All other systems reviewed and are otherwise negative except as noted above.   Physical Exam    VS:  BP (!) 130/58   Pulse (!) 51   Ht 5\' 10"  (1.778 m)   Wt 160 lb 6.4 oz (72.8 kg)   SpO2 94%   BMI 23.02 kg/m   GEN: Well nourished, well developed, in no acute distress. HEENT: normal. Neck: Supple, no JVD, carotid bruits, or masses. Cardiac: RRR, no murmurs, rubs, or gallops. No  clubbing, cyanosis, edema.  Radials/DP/PT 2+ and equal bilaterally.  Respiratory:  Respirations regular and unlabored, clear to auscultation bilaterally. GI: Soft, nontender, nondistended, BS + x 4. MS: no deformity or atrophy. Skin: warm and dry, no rash. Neuro:  Strength and sensation are intact. Psych: Normal affect.  Accessory Clinical Findings    ECG personally reviewed by me today - No EKG in office today.   Lab Results  Component Value Date   WBC 8.3 09/09/2006   HGB 16.5 09/09/2006   HCT 48.6 09/09/2006   MCV 95.2 09/09/2006   PLT 222 09/09/2006   Lab Results  Component Value Date   CREATININE 1.0 09/09/2006   BUN 15 09/09/2006   NA 146 (H) 09/09/2006   K 3.9 09/09/2006   CL 106 09/09/2006   CO2 31 09/09/2006   Lab Results  Component Value Date   ALT 34 09/09/2006   AST 35 09/09/2006   ALKPHOS 54 09/09/2006   BILITOT 1.2 09/09/2006   Lab Results  Component Value Date   CHOL 162 09/09/2006   HDL 41.2 09/09/2006   LDLCALC 93 09/09/2006   TRIG 138 09/09/2006   CHOLHDL 3.9 CALC 09/09/2006    No results found for: "HGBA1C"  Assessment & Plan    1. Hypertension: BP still elevated above goal with SBP rangning from 130s-180s. Will increase amlodipine to 7.5 mg daily and see if he tolerates this.  I advised him to notify 09/11/2006 if he has any dizziness, presyncope, syncope.  Continue to monitor BP and report BP consistently > 130/80 or SBP <110.  I advised him to monitor his BP 2 hours after medications and following 5 to 10 minutes of seated rest.  Continue valsartan.  2. CAD: Cath in December 2022 showed 30% mid LAD stenosis, otherwise minor irregularities.  Stable with no anginal symptoms. No indication for ischemic evaluation. Continue aspirin, valsartan, amlodipine, and Lipitor.  3. H/o syncope: Echo in June 2023 showed normal LV function, G1 DD, trace aortic valve regurgitation. Outpatient cardiac monitor in July 2023 showed sinus rhythm with PACs, brief PAT, PVCs,  4-6 beat runs of nonsustained ventricular tachycardia. Denies any recent presyncope, syncope.  There was thought to be some component of orthostasis.  Beta-blocker was also discontinued due to mild bradycardia. Encouraged him to monitor symptoms with increase in amlodipine as above.  Continue adequate hydration, gradual positional changes.  4. Carotid artery stenosis: Less than 50% bilaterally on CTA in March 2023.  Asymptomatic. Continue ASA, statin.   5. Hyperlipidemia: No recent LDL on file.  Continue atorvastatin.  6. Disposition: Follow-up  as scheduled with Dr. April 2023 in 05/2022.      06/2022, NP 03/21/2022, 12:19 PM

## 2022-03-21 NOTE — Patient Instructions (Addendum)
Medication Instructions:  Amlodipine 7.5 mg daily  *If you need a refill on your cardiac medications before your next appointment, please call your pharmacy*   Lab Work: NONE ordered at this time of appointment   If you have labs (blood work) drawn today and your tests are completely normal, you will receive your results only by: [place] (if you have MyChart) OR A paper copy in the mail If you have any lab test that is abnormal or we need to change your treatment, we will call you to review the results.   Testing/Procedures: NONE ordered at this time of appointment    Follow-Up: At [facility], you and your health needs are our priority.  As part of our continuing mission to provide you with exceptional heart care, we have created designated Provider Care Teams.  These Care Teams include your primary Cardiologist (physician) and Advanced Practice Providers (APPs -  Physician Assistants and Nurse Practitioners) who all work together to provide you with the care you need, when you need it.  We recommend signing up for the patient portal called "MyChart".  Sign up information is provided on this After Visit Summary.  MyChart is used to connect with patients for Virtual Visits (Telemedicine).  Patients are able to view lab/test results, encounter notes, upcoming appointments, etc.  Non-urgent messages can be sent to your provider as well.   To learn more about what you can do with MyChart, go to [email or web address].    Your next appointment:    Keep follow up appointment   The format for your next appointment:   In Person  Provider:   Kirk Ruths, MD     Other Instructions Goal blood pressure is 130/80.   Important Information About Sugar

## 2022-03-27 NOTE — ED Provider Notes (Signed)
[facility] CARE STANDARDS DECLARED due to short staffing, bed availability, viral surge (COVID, Influenza, RSV) and limited resources in this facility, throughout this region and the [place] of [place].  This may result in rationing of care and/or necessary deviation from community standards of care due to limited resources.    [facility]      Curlene Dolphin  Emergency Department Encounter Note     [phone]            No Physician on file    History     CC:   Abdominal Pain      HPI:   Evan [name] is a 76 y.o. male who presents to the ED for evaluation of pain severe 8 out of 10 left lower abdominal pain started while he was on the computer sitting up around 10 AM.  It makes him have sudden severe urinary urgency.  Patient even had a couple episodes of dribbling incontinence.  Denies any hematuria.  Is known to have stage III kidney disease.  Patient had hernia repair 40 years ago but denies any lifting injury or bulging in the area.  It does not hurt to touch.  He has had normal bowels he did have some moderate nausea earlier when the pain was severe.  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    PSH:   He  has a past surgical history that includes hernia repair; Nasal polyp surgery (2001); Eye surgery; and Cardiac catheterization (11/15/2016).    Medications:   PTA Home Medications   Medication Sig   . albuterol (PROAIR HFA) 90 mcg/puff inhaler INHALE ONE PUFF BY MOUTH EVERY 4 HOURS AS NEEDED.   [name] Ambulatory Compound Builder Glucometer,  test strips, lancets, alcohol swabs..   . amLODIPine (NORVASC) 2.5 mg tablet Take 2 tablets by mouth Daily.   [name] ammonium lactate (LAC-HYDRIN) 12% cream Apply to toes once daily..   . aspirin 81 MG EC tablet Take 81 mg by mouth Daily.   [name] atorvaSTATin (LIPITOR) 40 mg tablet TAKE 1 TABLET BY MOUTH EVERY EVENING.   . B-D ULTRAFINE III SHORT PEN 31G X 8 MM BY OTHER ROUTE TWICE DAILY   . Blood Glucose Monitoring Suppl (ONETOUCH VERIO) w/Device KIT Use to check blood glucose 4 times daily. On insulin.   . clindamycin (CLEOCIN) 300 MG capsule    . cyanocobalamin (VITAMIN B-12) 1,000 mcg tablet Take 1,000 mcg by mouth 2 times daily.   . diclofenac (VOLTAREN) 1% GEL Apply 4 g topically 4 times daily as needed (for pain and inflammation). Apply to the affected area up to 4 times daily.   . fluticasone-umeclidinium-vilanterol (TRELEGY ELLIPTA) 200-62.5-25 mcg/puff inhaler INHALE 1 PUFF INTO THE LUNGS DAILY   . furosemide (LASIX) 20 mg tablet Take 1 tablet by mouth Daily.   [name] gabapentin (NEURONTIN) 300 mg capsule Take 1 capsule in the morning, 1 capsule in the afternoon and 2 capsules at night..   . glimepiride (AMARYL) 4 mg tablet Take 1 tablet by mouth every morning (before breakfast).   [name] glucose blood VI test strips (ONETOUCH VERIO) strip Use to check blood sugar 4 times daily. Uses  insulin.   [name] HYDROcodone-acetaminophen (NORCO) 10-325 mg per tablet Take 1 tablet by mouth every 6 hours as needed for Pain for up to 28 days.   Melene Muller ON 04/16/2022] HYDROcodone-acetaminophen (NORCO) 10-325 mg per tablet Take 1 tablet by mouth every 6 hours as needed for Pain for up to 28 days.   . hydroxychloroquine (PLAQUENIL) 200 mg tablet Take 1 tablet by mouth Daily.   . insulin glargine (TOUJEO SOLOSTAR) 300 units/mL concentrated injection (pen) Inject 70 Units under the skin every 12 hours.   . insulin pen needle (PEN NEEDLES 5/16) 30G X 8 MM MISC 100 each by Other route 2 times daily.   . Insulin Syringe-Needle U-100 (INSULIN SYRINGE  1CC/31GX5/16) 31G X 5/16 1 ML MISC Use to inject insulin twice daily..   . leflunomide (ARAVA) 20 mg tablet Take 1 tablet by mouth Daily.   [name] LOKELMA 10 g packet    . metoprolol tartrate (LOPRESSOR) 50 mg tablet TAKE 1 TABLET BY MOUTH TWICE DAILY.   [name] Needle, Disp, 30G X 5/16 MISC 1 each by Does not apply route Daily.   [name] NOVOLOG 100 UNIT/ML injection INJECT 50 UNITS UNDER THE SKIN BEFORE BREAKFAST AND DINNER ALONG WITH SLIDING SCALE --- BG < 150: none BG 150-200: 3 units BG 201-250: 6 units BG 251-300: 9 units BG 301-350: 12 units BG 351-400: 15 units BG > 400: 18 units and call provider Max 130 units daily.   [name] olmesartan (BENICAR) 40 MG tablet Take 1 tablet by mouth Daily.   . sodium bicarbonate 650 mg tablet DISSOLVE AND TAKE ONE TABLET BY MOUTH TWICE DAILY.   [name] testosterone 12.5 mg/1.25 g actuation (1%) gel APPLY 4 PUMPS TO THE SKIN EVERY DAY.   [name] triamcinolone (KENALOG) 0.1% cream APPLY TOPICALLY TWO TIMES A DAY.   [name] UNCODED DME Autoset BIPAP - set to 23/18 CWP, heated, humidity and all supplies. Nasal Mask.   Length of need: 99  Diagnosis: OSA  PAP supplies with replacements as follows or as insurance permits:  Heated Humidifier    Mask interface, 1 per 3 months  Cushion replacement, 2 per month  Pillow set, replacement, 2 set per month  Full face mask, 1 per 3 months  Full face cushion replacement, 1 per month  Headgear, 1 per 6 months  Heated/nonheated tubing, 1 per 3 months  Filter, disposable, 2 per month  Filter, nondisposable, 1 per 6 months  Chinstrap, 1 per 6 months  Humidifier chamber, 1 per 6 months.  Multiple mask types worn   DME: [employer]    Electronically Signed by Sydnee Cabal   NPI - [id number].   [name] UNCODED DME Circaid Juxalite edema wraps for LE  Apply to both lower extremities daily.       Allergies:   He is allergic to lisinopril, neomycin-polymyxin b gu, sulfa antibiotics, and levofloxacin.    Social History:    He  reports that he quit smoking about 11 years ago. His smoking  use included cigarettes. He has a 100.00 pack-year smoking history. He has never used smokeless tobacco.. He  reports that he does not currently use alcohol after a past usage of about 5.0 standard drinks of alcohol per week.    Family History:  Family History   Problem Relation Age of Onset   . Other (see comment) Mother         adopted parent   . Other (see comment) Father         adopted  parent   . Other (see comment) Daughter         healthy   . Other (see comment) Son         healhty         Review of Systems:     All other systems were reviewed and are negative other than in the HPI      Physical Exam   VITAL SIGNS: BP 184/90   Pulse 88   Temp 36.6 ?C (97.9 ?F)   Resp 16   Wt (!) 181.4 kg (400 lb)   SpO2 92%   BMI 52.77 kg/m?     Constitutional: Mild painful acute distress. Non-toxic appearance.  BMI is 52    HEENT: Normocephalic. Atraumatic.  Pupils equal, extra occular movement intact.     Neck:  Supple and nontender without adenopathy.    Chest: Lungs clear to auscultation without wheezes, rales or rhonchi.    Cardiovascular: Regular rate and rhythm without murmurs, rubs or gallops.  Strong distal pulses    Abdomen: Bowel sounds normal, Soft, No tenderness, No masses, No pulsatile masses.  No rebound guarding or rigidity.    Musculoskeletal: Good range of motion. No tenderness to palpation, no gross deformities noted.     Extremities: No cyanosis, clubbing or edema.    Skin: Warm and dry. No erythema. No rash.    Neurologic: Alert & oriented,             ED Course and Medical Decision Making:    Curlene Dolphin presented to the ED for evaluation, and he was triaged to room ER03.  Medical records and nursing notes reviewed.     7:16 PM PDT   Patient presents with burning left lower quadrant abdominal pain of high intensity intermittently since 10 AM.  Physical exam is reassuring there is no skin lesions making shingles unlikely.  Patient has no pain with palpation and there is no obvious hernia felt  making it less likely diverticulitis or abscess.  Severity may be just kidney stone.  We will check labs urinalysis and do a CT of the abdomen  9:57 PM PDT  Patient's his CT did show a 5 mm distal kidney stone.  Patient's urinalysis is sterile.  White count slightly elevated most likely from the pain he has been having less likely from infection.  Patient's creatinine is up a little bit from his baseline.  At this time patient's pain has much improved to level 3.  2 incidental findings were found on CT a small AAA with recommendation of repeat imaging in 2 years and a kidney mass with recommendation for multiphase kidney CT.  Patient notified of both of these and will follow up with his primary care to obtain them.  He is also given referral to urology.  Told to return for intractable pain of fever and he should have his labs rechecked in a week to make sure his creatinine is not worsening.    ICD-10-CM ICD-9-CM    1. Kidney stone  N20.0 592.0 Ambulatory referral to Urology      morphine (MSIR) 15 mg tablet      2. Abdominal aortic aneurysm (AAA) without rupture, unspecified part (HCC)  I71.40 441.4       3. Kidney mass  N28.89 593.9 Ambulatory referral to Urology           Lysbeth Penner, DO  03/27/22 2157

## 2022-04-11 ENCOUNTER — Telehealth: Payer: Self-pay | Admitting: Cardiology

## 2022-04-11 NOTE — Telephone Encounter (Signed)
Patient stated his BP ranges from 137/59 to 116/55 with P 47. He stopped taking amlodipine 7.5mg  x past 2 days because of DBP.  Last PM BP 139/70. He denies chest pain, but has sob with brisk walking. He is staying hydrated. He reports being sluggish in the mornings since May and that his day doesn't really start until noon, energy wise. He stated he sleeps well at night and gets up once to void. Please advise on BP and amlodipine, etc.

## 2022-04-11 NOTE — Telephone Encounter (Signed)
Pt c/o BP issue: STAT if pt c/o blurred vision, one-sided weakness or slurred speech  1. What are your last 5 BP readings? 137/60, 116/55, 128/59  2. Are you having any other symptoms (ex. Dizziness, headache, blurred vision, passed out)? no  3. What is your BP issue? Patient states his BP has been low. He says he has been taking amlodipine 5 mg and last time he was seen it was increased to 7.5 mg. Patient states because his BP has been so low he stopped taking the extra 2.5 mg a couple days ago, but he has not seen a difference.

## 2022-04-11 NOTE — Telephone Encounter (Signed)
Patient informed of E. Monge's response: "Overall, blood pressure has improved since his visit with me, appears overall stable.  If he is concerned that he is feeling poorly with his BP where it is currently, and would like to no longer take 7.5 mg of amlodipine daily, he may resume 5 mg of amlodipine daily.  Continue to monitor BP and report BP consistently greater than 130/80. Otherwise, continue current medications and follow-up up as planned.  Thank you.-EM" Discussed with patient BP procedure and to call clinic before making any medication adjustments. Patient verbalized understanding.

## 2022-05-03 ENCOUNTER — Ambulatory Visit: Admit: 2022-05-03 | Discharge: 2022-05-03 | Payer: MEDICARE | Attending: MD | Primary: Family

## 2022-05-03 DIAGNOSIS — I50812 Chronic right heart failure: Secondary | ICD-10-CM

## 2022-05-03 NOTE — Progress Notes (Signed)
DOS: 05/03/22      Evan [name]  June 30, 1945    ______________  Clinical Resume  1.  Coronary artery disease.       a.  Myocardial perfusion scan performed July 13, 2016 at the time of cellulitis with asymptomatic rise in troponin to 7.7 showing medium-sized fixed inferior defect with mild peri-infarct ischemia, LVEF 74%.       b.  Cardiac catheterization performed Nov 15, 2016 showing occluded right coronary artery with left to right collaterals with nonobstructive disease in the LAD and left circumflex, elevated LVEDP 19 mmHg, mean PA pressure 25 mmHg, preserved cardiac output.       c.  Transthoracic echocardiogram performed on 12/20/2020 showing normal left ventricular ejection fraction of 60% - 65%, mildly enlarged right ventricle with mildly decreased right ventricular systolic function, borderline enlarged left ventricular cavity size of 59 mm.  2.  Chronic kidney disease.  3.  Morbid obesity.  4.  Gout.  5.  Hyperlipidemia.  6.  Diabetes mellitus.  7.  COPD.  8.  Hypertension.  9.  Obstructive sleep apnea.  10.  Chronic right heart failure.  ______________  Chief Complaint  ?Chronic right heart failure, coronary artery disease  ______________________  History of Present Illness  I had the pleasure of seeing Evan [name] in clinic today.  He is a pleasant 76 year old man who presents for evaluation of coronary artery disease and chronic right heart failure.  He originally presented in 2018 with cellulitis and had an elevated troponin.  Echocardiogram showed severe right ventricular enlargement.  Myocardial perfusion scan showed fixed ventricular defect.  He underwent cardiac catheterization, which showed occluded right coronary artery with left and right collaterals.  This was treated directly.  His right heart catheterization showed his mean PA pressure was only 25 mmHg.  His LVEDP was 19 mmHg.  Since he was last seen he has continued on Nutrisystem for weight loss.  He had lost weight down to  406 pounds, but has gained back to 414 pounds.  He has had continued symptoms of severe shortness of breath with walking any distance.  He has had chronic stable lower extremity edema.    He has not had any symptoms of chest pain.  His blood pressures at home he states have all been in the 130s or less.  He has not been very active.  _________________________________  Home Medications             albuterol (PROAIR HFA) 90 mcg/actuation inhaler     INHALE ONE PUFF BY MOUTH EVERY 4 HOURS AS NEEDED     allopurinol (ZYLOPRIM) 300 MG tablet     300 mg.     amLODIPine (NORVASC) 2.5 MG tablet     Take 5 mg by mouth once daily.     aspirin 81 MG EC tablet     81 mg.     atorvastatin (LIPITOR) 40 mg tablet     Take 40 mg by mouth once daily.     custom medication     every night at bedtime. Med Name: cpap     cyanocobalamin (VITAMIN B-12) 1000 MCG tablet     Take 1,000 mcg by mouth 2 times daily.       fluticasone-umeclidinium-vilanterol (TRELEGY ELLIPTA) 200-62.5-25 mcg inhaler     Inhale 1 puff into the lungs once daily.     furosemide (LASIX) 20 mg tablet     Take 1 tablet by mouth once daily.  gabapentin (NEURONTIN) 300 mg capsule     Take 300 mg by mouth 3 times daily.     glimepiride (AMARYL) 4 MG tablet     Take 4 mg by mouth every morning before breakfast.     HYDROcodone-acetaminophen (NORCO) 10-325 mg tablet     Take 1 tablet by mouth once daily.     hydroxychloroquine (PLAQUENIL) 200 mg tablet     Take 200 mg by mouth once daily.     insulin aspart (NOVOLOG) 100 unit/mL PEN     Inject 22 Units under the skin 3 times daily with meals.     Patient taking differently: Inject 22 units under the skin once daily. Per sliding scale     insulin glargine (LANTUS) 100 unit/mL injection     Inject 25 Units under the skin every 12 hours.     Patient taking differently: Inject 25 units under the skin once daily. Per sliding scale     leflunomide (ARAVA) 20 mg tablet     Take 20 mg by mouth once daily.     metoprolol  tartrate (LOPRESSOR) 50 MG tablet     TAKE ONE TABLET BY MOUTH TWICE A DAY     olmesartan (BENICAR) 40 mg tablet     Take 20 mg by mouth once daily.     sodium bicarbonate 650 mg tablet     Take 650 mg by mouth 2 times daily.     testosterone (ANDROGEL) 12.5 mg/ 1.25 gram (1 %) GlPm     Apply 4 Act topically Daily.     triamcinolone (KENALOG) 0.1 % cream     Apply  topically 2 times daily.      ________________  SOCIAL HISTORY  Tobacco, quit.  Alcohol, none.  ________________  FAMILY HISTORY  He is adopted.  ________  Allergies   Allergen Reactions   . Lisinopril Anaphylaxis   . Sulfa (Sulfonamide Antibiotics) Other (See Comments)     Extrasystole     . Levofloxacin Rash     Maculopapular rash   . Neomycin Rash     ________________  Review of Systems   Musculoskeletal: Positive for arthritis and myalgias.   Neurological: Positive for numbness.     ________________  Vitals:    05/03/22 1110 05/03/22 1111   BP: 156/74 160/88   BP Location: Left arm Left arm   Patient Position: Sitting Standing   Pulse: 68    SpO2: 92%    Weight: (!) 414 lb (187.8 kg)    Height: 6' 1 (1.854 m)         Body mass index is 54.62 kg/m?.  _______________  Physical Exam   GENERAL: Alert oriented in no acute distress, obese  EYES: EOMI none scleral icterus  ENMT: Oral mucous membranes moist JVD not elevated  LUNGS: Clear to auscultation bilaterally  CARDIAC: Regular rate and rhythm no murmurs rubs or gallops  ABDOMEN: Soft nontender nondistended bowel sounds active  LOWER EXTREMITIES: 1-2+ nonpitting edema bilaterally in the lower extremities  NEUROLOGIC: Nonfocal  PSYCH: Normal mood and affect  SKIN: No rashes or lesions  _________________  Diagnostic Studies  EKG performed today personally reviewed shows sinus rhythm with a heart rate of 68 with 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  ___________________  Assessment and Plan  1.  Coronary artery disease without angina.  He will continue on aspirin, atorvastatin, and  metoprolol.  His LDL is at goal.  2.  Hypertension.  Continue amlodipine  and metoprolol.  3.  Chronic right heart failure.  He will continue on furosemide.    It was a pleasure to see Evan [name] in clinic today.  If you have further questions or concerns, please do not hesitate to contact me.  I will plan to see him on an as-needed basis.  __________________________  Orders Placed This Encounter   Procedures   . POC EKG       ?     No LOS data to display      Return if symptoms worsen or fail to improve.     Glendale Chard, MD, [facility]  NF / isan/ias

## 2022-05-18 ENCOUNTER — Ambulatory Visit: Payer: MEDICARE | Primary: Family

## 2022-05-18 ENCOUNTER — Encounter: Attending: Student in an Organized Health Care Education/Training Program | Primary: Family

## 2022-05-21 ENCOUNTER — Ambulatory Visit
Admit: 2022-05-21 | Discharge: 2022-05-21 | Payer: MEDICARE | Attending: Student in an Organized Health Care Education/Training Program | Primary: Family

## 2022-05-21 DIAGNOSIS — N1831 Chronic kidney disease, stage 3a (HCC): Secondary | ICD-10-CM

## 2022-05-21 MED ORDER — sodium bicarbonate 650 mg tablet
650 | Freq: Two times a day (BID) | ORAL | 0.00 refills | Status: DC
Start: 2022-05-21 — End: 2022-06-06

## 2022-05-21 NOTE — Progress Notes (Addendum)
[facility]  Follow-up Note    Evan [name]  March 24, 1946    HISTORY OF PRESENT ILLNESS:     Chief Complaint:  27yoM w CKD3b, HTN, DM, HFpEF, CAD, OSA on CPAP, COPD, RA presents for CKD follow up    Interval history  Had a stone, presented to ER, imaging showed ?RCC, seeing Dr [name] end of the month, w MRI mid month  Drinking lots of fluids--about a gallon per day    HTN history  Diagnosed 2018  Takes amlodipine 5, olmesartan 40, metop 50bid, lasix 20  Prior BP logs: 120-130/80s range    DM history  T2DM  Diagnosed 2018  On insulin  No h/o retinopathy    Renal history  No prior h/o UTIs, leg ulcers/PVD  No recent NSAID/PPI use, COVID infection or stone  Remote h/o smoking    Previous labs (adapted from prior providers):  04/2022 Cr 1.7, GFR 41, Bic 20, K 5.3, UPCR 1.47  01/17/22  BUN 44 creatinine 1.74 GFR 48 sodium 143 potassium 4.5 bicarbonate 21 glucose 310 albumin 3.2 calcium 8.5 phosphorus 3.6 PTH 145  Hemoglobin 12.5  Microalbumin to creatinine ratio 692  A1c 6.5%  07/07/21 Cr 1.84, GFR 38, K 5.7, Glu 284, ?Hb 11.5, UA w neg pro, blood, 0-2W, R, UPCR 0.5  12/08/19 sodium 145 potassium 4.1 chloride 112 carbon dioxide 19 anion gap 14 glucose 149 BUN 35 creatinine 1.68 eGFR 40 cc/min calcium 8.8 phosphorus 4.1 albumin 3.26, /22/21 UACR 220 ug/mg  03/27/19 Hemoglobin 12.5 Sodium 142 Potassium 4.3 Chloride 109 Carbon dioxide 21 Anion gap 12 Glucose 150 BUN 38 Creatinine 1.58 eGFR 43 cc/min Calcium 9.1 Phosphorus 4.5 Albumin 3.8 UACR 107 ug/mg  08/07/18 BUN 39 creatinine 1.63 eGFR 42 cc/m  04/03/18 ?BUN 35 creatinine 1.51 eGFR 46 cc/min Urine protein 11 Urine creatinine 29 UPC 0.4 g/day 25-OH Vitamin D 29.2  11/14/17 BUN 35 creatinine 1.51 eGFR 46 cc/min  04/24/17 creatinine 1.67  12/31/16 creatinine 1.75  11/22/16 creatinine 1.46  11/20/16 creatinine 1.41  11/08/16 creatinine 1.40  08/20/16 creatinine 1.69  12/01/15 creatinine 1.37  11/08/14 creatinine 1.21  04/16/14 creatinine 1.40  11/05/13 creatinine  1.10    Imaging:  Last renal imaging: [place] 2018 R kidney 13.8cm, L kidney 13.8cm, PVR not measured    CT  Left inferior pole lesion has no enhancement and is not suspicious.     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 Consider renal MRI with and without IV contrast and urological consultation.     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 Consider follow-up CT abdomen/pelvis or aortic ultrasound in 12 months to document stability.          Past Medical History:  has a past medical history of COPD (chronic obstructive pulmonary disease) (HCC), Diabetes mellitus, type II (HCC), Hypercholesteremia, Hypertension, and Obesity, morbid (HCC).     Past Surgical History:  has no past surgical history on file.       Outpatient Medications as of 05/21/2022:   .  albuterol, INHALE ONE PUFF BY MOUTH EVERY 4 HOURS AS NEEDED  .  amLODIPine, 5 mg, Oral, Daily  .  aspirin, 81 mg.  .  atorvastatin, 40 mg, Oral, Daily  .  custom medication, every night at bedtime. Med Name: cpap  .  cyanocobalamin, Take 1,000 mcg by mouth 2 times daily.    .  TRELEGY ELLIPTA, 1 puff, Inhalation,  Daily  .  furosemide, 20 mg, Oral, Daily  .  gabapentin, 300 mg, Oral, TID  .  glimepiride, 4 mg, Oral, QAM AC  .  HYDROcodone-acetaminophen, 1 tablet, Oral, Daily  .  hydroxychloroquine, 200 mg, Oral, BID  .  leflunomide, 20 mg, Oral, Daily  .  metoprolol tartrate, TAKE ONE TABLET BY MOUTH TWICE A DAY  .  olmesartan, 20 mg, Oral, Daily  .  testosterone, Apply 4 Act topically Daily.  [name]  triamcinolone, Apply  topically 2 times daily.  [name]  allopurinoL, 300 mg. (Patient not taking: Reported on 05/21/2022)  .  insulin aspart, 22 units, Subcutaneous, TID with meals (Patient taking differently: 22 units, Subcutaneous, Daily, Per sliding scale)  .  insulin glargine, 25 units, Subcutaneous, Q12H [facility] (Patient taking differently: 25 units, Subcutaneous, Daily, Per sliding scale)  .   sodium bicarbonate, 1,300 mg, Oral, BID     Allergies: is allergic to lisinopril, sulfa (sulfonamide antibiotics), levofloxacin, and neomycin.     Family History: family history is not on file.     Social History:  reports that he has never smoked. He has never used smokeless tobacco. He reports that he does not drink alcohol and does not use drugs.     Health Maintenance   Topic Date Due   . Hepatitis C Screening  Never done   . Annual Wellness Visit  Never done   . Urine Drug Screen  10/08/2018   . COVID-19 Vaccine (5 - 2023-24 season) 02/16/2022   . Influenza Vaccine (1) 03/18/2022   . Tetanus Vaccine (2 - Td or Tdap) 07/03/2021   . Metabolic Panel  03/28/2023   . Lipid Panel  04/04/2023   . Zoster Vaccine  Completed   . Pneumococcal Vaccine: 65+ Years  Completed       REVIEW OF SYSTEMS     Denies dyspnea, light-headedness, nocturia, hematuria, dysuria, foamy urine.  Has chronic LL edema  Has urinary urgency and dribbling    PHYSICAL EXAM     BP 152/80 (BP Location: Right forearm, Patient Position: Sitting)   Pulse 61   Ht 6' 1 (1.854 m)   Wt (!) 429 lb 12.8 oz (195 kg)   SpO2 94%   BMI 56.71 kg/m?     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.31    Relevant Medications    sodium bicarbonate 650 mg tablet    Other Relevant Orders    Renal Function Panel -Routine    Renal Function Panel -Routine    Total Protein/Creatinine Ratio Random Urine -Routine    Urinalysis with Microscopic, Culture if Indicated -Routine    Magnesium -Routine    Anemia of chronic renal failure, stage 3a  N18.31, D63.1    Relevant Medications    diclofenac (VOLTAREN ARTHRITIS PAIN) 1 % topical gel    sodium bicarbonate 650 mg tablet    Other Relevant Orders    Hemoglobin -Routine    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  Advised to restart.  Advised increasing sodium bicarbonate to 4 pills per day.  Repeat BMP in 1 week  No hematuria, but does have proteinuria Has risen from prior.  He continues on full dose olmesartan.  On next visit we will consider starting SGLT 2 inhibitor  Counseled on BP and DM control, avoiding NSAIDs and encouraged hydration  Medications reviewed  All questions answered and relevant labwork discussed with pt to apparent satisfaction  Follow up in 4 months with pre-visit lab    Renal Lesion  Status Unstable    Plan  I did explain the risk of NSF While highlighting that Per KDIGO CKD guidelines of 2012, No patients with a GFR > 30 ml/min/1.73 m2 have developed NSF without concomitant liver failure.   Management per urology    Hypertension  Status:  Well control    Plan  Goal SBP 120-130s  Continue current anti-HTN meds  Pt informed to keep track of BP and bring to next visit. If SBP<110 or >140, pt to call clinic for medication adjustment    Secondary hyperparathyroidism  Status:  Controlled with calcium 9.1 mg/dl    Plan  No changes to meds  Follow up PTH, Vit D next visit    Hyperkalemia  Status: Unstable    Is not taking Lokelma.  Advised to restart.  Repeat BMP in 1 week    Metabolic acidosis  Status: Unstable    Advised increasing sodium bicarbonate to 4 pills per day.  Repeat BMP in 1 week    Anemia  Status:  Controlled with the last hemoglobin of 10.2 g/dl    Plan  No EPO needs at this  time  Follow up next visit      This dictation was produced using voice recognition software. Despite concurrent proofreading, please note transcription errors may be present and that errors may not reflect intent.   Any portions copied from prior notes have been reviewed, repeated or edited to match the patients current presentation or left intact as applicable.    Electronically signed by:  Regino Bellow,  05/21/2022   2:03 PM

## 2022-05-22 NOTE — Progress Notes (Signed)
HPI:FU syncope. Previously followed by Dr. Danley Danker with [facility]. Stress echocardiogram November 2022 showed normal LV function and ischemia in the inferior/inferolateral walls by report.  Cardiac catheterization December 2022 showed 30% mid LAD and otherwise minor irregularities.  CTA March 2023 showed 50% or less stenosis in the right and left proximal internal carotid arteries.  Carotid Dopplers May 2023 technically difficult; there was suggestion of significant stenosis in the left internal carotid artery but report states "I would rely more on findings from recent CTA". Echocardiogram June 2023 showed normal LV function, grade 1 diastolic dysfunction, trace aortic insufficiency.  Monitor July 2023 showed sinus rhythm with PACs, brief PAT, PVCs, 4 and 6 beat runs of nonsustained ventricular tachycardia. Patient has had difficulties with orthostatic hypotension and syncope. Since last seen, patient denies chest pain or recurrent syncope.  He has chronic dyspnea on exertion.  Occasional minimal pedal edema.  His systolic blood pressure is running in the 140-150 range.  Current Outpatient Medications  Medication Sig Dispense Refill   amLODipine (NORVASC) 5 MG tablet Take 1 tablet (5 mg total) by mouth daily. 90 tablet 3   ascorbic acid (VITAMIN C) 250 MG tablet Take by mouth.     aspirin EC 81 MG tablet Take 81 mg by mouth daily. Swallow whole.     atorvastatin (LIPITOR) 80 MG tablet Take 80 mg by mouth daily.     Coenzyme Q10 50 MG CAPS Take by mouth.     Cyanocobalamin (VITAMIN B 12 PO) Take by mouth.     Magnesium 250 MG TABS Take by mouth.     Omega-3 Fatty Acids (FISH OIL) 1000 MG CAPS Take by mouth.     omeprazole (PRILOSEC) 20 MG capsule Take 20 mg by mouth daily.     sulfamethoxazole-trimethoprim (BACTRIM DS) 800-160 MG tablet Take 1 tablet by mouth 2 (two) times daily.     valsartan (DIOVAN) 160 MG tablet Take 1 tablet (160 mg total) by mouth daily. 90 tablet 3   amLODipine  (NORVASC) 2.5 MG tablet Take 1 tablet (2.5 mg total) by mouth daily. 90 tablet 3   No current facility-administered medications for this visit.     Past Medical History:  Diagnosis Date   CAD (coronary artery disease)    COPD (chronic obstructive pulmonary disease) (HCC) 09/07/2021   GERD (gastroesophageal reflux disease)    Heart murmur    Hiatal hernia 07/16/2021   Hyperlipidemia    Hypertension     Past Surgical History:  Procedure Laterality Date   cardiac catherization     CHOLECYSTECTOMY     HERNIA REPAIR      Social History   Socioeconomic History   Marital status: Divorced    Spouse name: Not on file   Number of children: 0   Years of education: Not on file   Highest education level: Not on file  Occupational History   Occupation: Retired  Tobacco Use   Smoking status: Former    Packs/day: 0.25    Years: 15.00    Total pack years: 3.75    Types: Cigarettes, Pipe    Quit date: 07/08/1999    Years since quitting: 22.9    Passive exposure: Past   Smokeless tobacco: Never   Tobacco comments:    None  Vaping Use   Vaping Use: Never used  Substance and Sexual Activity   Alcohol use: Yes    Alcohol/week: 5.0 standard drinks of alcohol    Types: 5 Shots  of liquor per week    Comment: Occasional   Drug use: Never   Sexual activity: Not Currently  Other Topics Concern   Not on file  Social History Narrative   Not on file   Social Determinants of Health   Financial Resource Strain: Not on file  Food Insecurity: Not on file  Transportation Needs: Not on file  Physical Activity: Not on file  Stress: Not on file  Social Connections: Not on file  Intimate Partner Violence: Not on file    Family History  Problem Relation Age of Onset   Stroke Mother    Heart attack Father    Early death Brother     ROS: no fevers or chills, productive cough, hemoptysis, dysphasia, odynophagia, melena, hematochezia, dysuria, hematuria, rash, seizure activity,  orthopnea, PND, pedal edema, claudication. Remaining systems are negative.  Physical Exam: Well-developed well-nourished in no acute distress.  Skin is warm and dry.  HEENT is normal.  Neck is supple.  Chest is clear to auscultation with normal expansion.  Cardiovascular exam is regular rate and rhythm.  Abdominal exam nontender or distended. No masses palpated. Extremities show no edema. neuro grossly intact  A/P  1 syncope-previous echocardiogram showed normal LV function and monitor was unrevealing.  Previously felt to be potentially orthostatic mediated and HCTZ discontinued and hydration stressed.  Beta-blocker was also discontinued due to history of mild bradycardia.  He has had no further episodes since last office visit.  Will continue to follow.  2 hypertension-blood pressure is running mildly elevated at home.  He is concerned about his diastolic running low.  I will increase his amlodipine to 7.5 mg daily.  Follow blood pressure and adjust as needed.  3 hyperlipidemia-continue statin.  4 carotid artery disease-previous CTA showed less than 50% bilaterally.  Continue medical therapy.  5 minimal coronary disease-this was noted at time of previous catheterization.  Continue medical therapy with statin.  Olga Millers, MD

## 2022-05-30 ENCOUNTER — Encounter: Payer: Self-pay | Admitting: Cardiology

## 2022-05-30 ENCOUNTER — Ambulatory Visit: Payer: Medicare HMO | Admitting: Cardiology

## 2022-05-30 VITALS — BP 124/68 | HR 49 | Ht 70.0 in | Wt 164.0 lb

## 2022-05-30 DIAGNOSIS — I2510 Atherosclerotic heart disease of native coronary artery without angina pectoris: Secondary | ICD-10-CM

## 2022-05-30 DIAGNOSIS — I10 Essential (primary) hypertension: Secondary | ICD-10-CM | POA: Diagnosis not present

## 2022-05-30 DIAGNOSIS — Z87898 Personal history of other specified conditions: Secondary | ICD-10-CM | POA: Diagnosis not present

## 2022-05-30 DIAGNOSIS — E785 Hyperlipidemia, unspecified: Secondary | ICD-10-CM | POA: Diagnosis not present

## 2022-05-30 NOTE — Patient Instructions (Signed)
Medication Instructions:   INCREASE AMLODIPINE TO 7.5 MG ONCE DAILY=1- 5 MG AND 1- 2.5 MG TABLET ONCE DAILY  *If you need a refill on your cardiac medications before your next appointment, please call your pharmacy*   Follow-Up: At [facility], you and your health needs are our priority.  As part of our continuing mission to provide you with exceptional heart care, we have created designated Provider Care Teams.  These Care Teams include your primary Cardiologist (physician) and Advanced Practice Providers (APPs -  Physician Assistants and Nurse Practitioners) who all work together to provide you with the care you need, when you need it.  We recommend signing up for the patient portal called "MyChart".  Sign up information is provided on this After Visit Summary.  MyChart is used to connect with patients for Virtual Visits (Telemedicine).  Patients are able to view lab/test results, encounter notes, upcoming appointments, etc.  Non-urgent messages can be sent to your provider as well.   To learn more about what you can do with MyChart, go to [email or web address].    Your next appointment:   6 month(s)  The format for your next appointment:   In Person  Provider:   Olga Millers, MD

## 2022-05-31 NOTE — Telephone Encounter (Addendum)
Magnesium, CO2 and potassium Look normal.  Lets have him decrease sodium bicarbonate to 3 pills a day Due to high sodium level and repeat renal panel in 1 week

## 2022-05-31 NOTE — Telephone Encounter (Signed)
Medication: Fluticasone-Umeclidin-Vilant                                             Refill Request Protocol 203    Staff: Tech    Refill Criteria    Last OV Date: 09/28/21    Action Taken/Needed:     Patient Due For: no labs or encounters at this time    Medication refill approved - meets refill criteria  Electronically sent to pharmacy

## 2022-05-31 NOTE — Telephone Encounter (Signed)
-----   Message from Ancil Linsey sent at 05/29/2022  9:12 AM PST -----  MG & Renal results faxed for your review.

## 2022-06-01 ENCOUNTER — Inpatient Hospital Stay: Payer: MEDICARE | Attending: MD | Primary: Family

## 2022-06-04 NOTE — Telephone Encounter (Signed)
Tried to call patient but no answer

## 2022-06-05 ENCOUNTER — Inpatient Hospital Stay: Payer: MEDICARE | Attending: MD | Primary: Family

## 2022-06-05 DIAGNOSIS — N2889 Other specified disorders of kidney and ureter: Secondary | ICD-10-CM

## 2022-06-06 MED ORDER — sodium bicarbonate 650 mg tablet
650 | Freq: Two times a day (BID) | ORAL | 0.00 refills | Status: DC
Start: 2022-06-06 — End: 2022-07-10

## 2022-06-06 NOTE — Telephone Encounter (Signed)
LMOVM for patient to return call.

## 2022-06-06 NOTE — Addendum Note (Signed)
Addended by: Biagio Quint on: 06/06/2022 10:01 AM     Modules accepted: Orders

## 2022-06-06 NOTE — Telephone Encounter (Signed)
Patient returned call and is advised of Dr Khan's response and instructions.  Patient verbalized understanding

## 2022-06-20 NOTE — Telephone Encounter (Signed)
-----   Message from Jolayne Haines sent at 06/15/2022 11:33 AM PST -----  Regarding: LABS 06/14/22  PLEASE REVIEW LABS FROM   06/14/22

## 2022-06-20 NOTE — Telephone Encounter (Signed)
Looks good, continue same regimen follow-up as scheduled

## 2022-06-20 NOTE — Telephone Encounter (Signed)
Please review lab after decrease in Sodium Bicarb to 3 qd

## 2022-06-21 NOTE — Telephone Encounter (Signed)
Spoke with patient advised of Dr Milta Deiters response and instructions.  Patient verbalized understanding.   He asked that I mail the lab orders for next appointment to him  This is done

## 2022-06-22 NOTE — Telephone Encounter (Signed)
Evan [name] called Tenielle R. [place], PA's team regarding Hau Hudecek on 06/22/2022 at 11:23 AM PST, requesting a refill for   Requested Prescriptions     Pending Prescriptions Disp Refills   . HYDROcodone-acetaminophen (NORCO) 10-325 mg per tablet 84 tablet 0     Sig: Take 1 tablet by mouth every 6 hours as needed for Pain for up to 28 days.   . The requested call back number is [phone] (home).    Pharmacy:   [id number] - [place], [place] - [address] AT [facility] [address]  [address]  [place] [address]  Phone: [phone] Fax: [phone]      Urgent?: Yes, Patient is almost out   Status: On time  Last Fill: 05/14/2022   Last visit: 02/07/2022 Claudemir Da Alease [place], [place]  Next visit: Visit date not found  Last Depression Screening:  PHQ-9 Total score Score   09/05/2015  11:24 AM 8   10/07/2017  12:48 PM 2   09/29/2019  10:18 AM 7   12/29/2019   9:58 AM 4   04/06/2020  10:20 AM 4     Medication Management Overview:    olmesartan (BENICAR) 40 MG tablet [[id number]]  Rx Protocol: Cardiovascular: Angiotensin Receptor Blockers Passed    Criteria:  Cr within 12 months  (Last Result: 1.73 on 06/14/2022)  K is 5.5 or below and within 12 months  (Last Result: 4.5 on 06/14/2022)  BP on file within last 12 months  (Last Result: 144/70 on 02/07/2022)  Patient is not pregnant  Valid visit within last 12 months  (Last Visit: 02/07/2022)    furosemide (LASIX) 20 mg tablet [[id number]]  Rx Protocol: Cardiovascular: Diuretics - Loop Passed    Criteria:  Cr within 12 months  (Last Result: 1.73 on 06/14/2022)  K in normal range and within 12 months  (Last Result: 4.5 on 06/14/2022)  Na in normal range and within 12 months  (Last Result: 145 on 06/14/2022)  BP on file within last 12 months  (Last Result: 144/70 on 02/07/2022)  Patient is not pregnant  Valid visit within last 12 months  (Last Visit: 02/07/2022)    gabapentin (NEURONTIN) 300 mg capsule [[id number]]  Rx Protocol: Neurology:  Anticonvulsants - Gabapentin Passed    Criteria:  Cr within 12 months  (Last Result: 1.73 on 06/14/2022)  eGFR is 60 or above and within 12 months  (Last Result: 40 ! on 06/14/2022)  Patient is not pregnant  Valid visit within last 12 months  (Last Visit: 02/07/2022)    hydroxychloroquine (PLAQUENIL) 200 mg tablet [[id number]]  Rx Protocol: Analgesics: Antirheumatic Agents - Hydroxychloroquine Passed    Criteria:  HCT within 12 months  (Last Result: 37.6 on 05/14/2022)  HGB within 12 months  (Last Result: 11.5 on 05/14/2022)  PLT within 12 months  (Last Result: 182 on 05/14/2022)  RBC within 12 months  (Last Result: 3.72 on 05/14/2022)  WBC within 12 months  (Last Result: 10.2 on 05/14/2022)  Valid visit within last 12 months  (Last Visit: 02/07/2022)  This refill cannot be delegated    leflunomide (ARAVA) 20 mg tablet [[id number]]  Rx Protocol: Analgesics: Antirheumatic Agents - Leflunomide Failed    Criteria:  Due ALT within 2 months  (Last Result: 19 on 03/27/2022)  Due AST within 2 months  (Last Result: 17 on 03/27/2022)  HCT within 2 months  (Last Result: 37.6 on 05/14/2022)  HGB within 2 months  (  Last Result: 11.5 on 05/14/2022)  PLT within 2 months  (Last Result: 182 on 05/14/2022)  WBC within 2 months  (Last Result: 10.2 on 05/14/2022)  BP on file within last 12 months  (Last Result: 144/70 on 02/07/2022)  Patient is not pregnant  Valid visit within last 12 months  (Last Visit: 02/07/2022)  If patient started therapy within the last 6 months, evaluate CBC and liver function every month.  This refill cannot be delegated    NOVOLOG 100 UNIT/ML injection [[id number]], insulin glargine (TOUJEO SOLOSTAR) 300 units/mL concentrated injection (pen) [[id number]]  Rx Protocol: Endocrinology: Diabetes - Insulins Passed    Criteria:  HBA1C within 6 months  (Last Result: 6.5 on 01/17/2022)  Valid visit within last 12 months  (Last Visit: 02/07/2022)    glimepiride (AMARYL) 4 mg tablet [[id number]]  Rx Protocol: Endocrinology:  Diabetes - Sulfonylureas Passed    Criteria:  HBA1C within 6 months  (Last Result: 6.5 on 01/17/2022)  Valid visit within last 12 months  (Last Visit: 02/07/2022)    Relevant labs:   Lab Results   Component Value Date    NA 145 06/14/2022    K 4.5 06/14/2022    CREA 1.73 (H) 06/14/2022    EGFR 40 (L) 06/14/2022    GFRNONAA 43 (L) 04/27/2020    ALT 19 03/27/2022    AST 17 03/27/2022     Lab Results   Component Value Date    CHOL 131 04/28/2021    TRIG 163 (H) 04/28/2021    HDL 35 (L) 04/28/2021    LDL 63 04/28/2021    URICACID 6.9 11/20/2016    PSA 0.48 01/17/2022      Lab Results   Component Value Date    WBC 10.2 05/14/2022    HGB 11.5 (L) 05/14/2022    HCT 37.6 (L) 05/14/2022    PLT 182 05/14/2022    Lab Results   Component Value Date    TSH 3.56 10/08/2013    GLU 139 (H) 06/14/2022    HBA1C 6.5 (H) 01/17/2022    MICROALBUMIN 237 05/14/2022

## 2022-06-22 NOTE — Telephone Encounter (Signed)
Patient wants a refill of his hydocodone sent to [facility] on [address]. Pharmacy confirmed. Patient is almost out of meds.

## 2022-06-28 NOTE — Telephone Encounter (Signed)
Pt. Needs an appointment prior to filling medication

## 2022-07-10 MED ORDER — sodium bicarbonate 650 mg tablet
650 | ORAL_TABLET | Freq: Two times a day (BID) | ORAL | 3 refills | Status: DC
Start: 2022-07-10 — End: 2022-07-16

## 2022-07-16 ENCOUNTER — Ambulatory Visit
Admit: 2022-07-16 | Discharge: 2022-07-16 | Payer: MEDICARE | Attending: Student in an Organized Health Care Education/Training Program

## 2022-07-16 DIAGNOSIS — N1831 Chronic kidney disease, stage 3a (HCC): Secondary | ICD-10-CM

## 2022-07-16 MED ORDER — sodium bicarbonate 650 mg tablet
650 | ORAL_TABLET | Freq: Three times a day (TID) | ORAL | 3 refills | Status: DC
Start: 2022-07-16 — End: 2022-07-23

## 2022-07-16 NOTE — Progress Notes (Signed)
[facility]  Follow-up Note    Evan [name]  1946-04-20    HISTORY OF PRESENT ILLNESS:     Chief Complaint:  76yoM w CKD3b, HTN, DM, HFpEF, CAD, OSA on CPAP, COPD, RA presents for CKD follow up    Last OV 05/2022    Interval history  Had a stone, presented to ER, imaging showed ?RCC, getting MRI this Friday had to be rescheduled a few times due to problems w MRI machine  Drinking lots of fluids--about a gallon per day    HTN history  Diagnosed 2018  Takes amlodipine 5, olmesartan 40, metop 50bid, lasix 20  Prior BP logs: 130/80s range    DM history  T2DM  Diagnosed 2018  On insulin  No h/o retinopathy    Renal history  No prior h/o UTIs, leg ulcers/PVD  No recent NSAID/PPI use, COVID infection or stone  Remote h/o smoking    Previous labs (adapted from prior providers):  05/2022 Cr 1.73, GFR 40,   04/2022 Cr 1.7, GFR 41, Bic 20, K 5.3, UPCR 1.47  01/17/22  BUN 44 creatinine 1.74 GFR 48 sodium 143 potassium 4.5 bicarbonate 21 glucose 310 albumin 3.2 calcium 8.5 phosphorus 3.6 PTH 145  Hemoglobin 12.5  Microalbumin to creatinine ratio 692  A1c 6.5%  07/07/21 Cr 1.84, GFR 38, K 5.7, Glu 284, Hb 11.5, UA w neg pro, blood, 0-2W, R, UPCR 0.5  12/08/19 sodium 145 potassium 4.1 chloride 112 carbon dioxide 19 anion gap 14 glucose 149 BUN 35 creatinine 1.68 eGFR 40 cc/min calcium 8.8 phosphorus 4.1 albumin 3.26, /22/21 UACR 220 ug/mg  03/27/19 Hemoglobin 12.5 Sodium 142 Potassium 4.3 Chloride 109 Carbon dioxide 21 Anion gap 12 Glucose 150 BUN 38 Creatinine 1.58 eGFR 43 cc/min Calcium 9.1 Phosphorus 4.5 Albumin 3.8 UACR 107 ug/mg  08/07/18 BUN 39 creatinine 1.63 eGFR 42 cc/m  04/03/18 BUN 35 creatinine 1.51 eGFR 46 cc/min Urine protein 11 Urine creatinine 29 UPC 0.4 g/day 25-OH Vitamin D 29.2  11/14/17 BUN 35 creatinine 1.51 eGFR 46 cc/min  04/24/17 creatinine 1.67  12/31/16 creatinine 1.75  11/22/16 creatinine 1.46  11/20/16 creatinine 1.41  11/08/16 creatinine 1.40  08/20/16 creatinine 1.69  12/01/15 creatinine  1.37  11/08/14 creatinine 1.21  04/16/14 creatinine 1.40  11/05/13 creatinine 1.10    Imaging:  Last renal imaging: [place] 2018 R kidney 13.8cm, L kidney 13.8cm, PVR not measured    CT  Left inferior pole lesion has no enhancement and is not suspicious.     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 Consider renal MRI with and without IV contrast and urological consultation.     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 Consider follow-up CT abdomen/pelvis or aortic ultrasound in 12 months to document stability.          Past Medical History:  has a past medical history of COPD (chronic obstructive pulmonary disease) (HCC), Diabetes mellitus, type II (HCC), Hypercholesteremia, Hypertension, and Obesity, morbid (HCC).     Past Surgical History:  has no past surgical history on file.       Outpatient Medications as of 07/16/2022:     albuterol, INHALE ONE PUFF BY MOUTH EVERY 4 HOURS AS NEEDED    amLODIPine, 5 mg, Oral, Daily    aspirin, 81 mg.    atorvastatin, 40 mg, Oral, Daily    custom medication, every night at bedtime. Med Name: cpap  cyanocobalamin, once daily.    diclofenac, Apply topically daily as needed. Effected area    TRELEGY ELLIPTA, 1 puff, Inhalation, Daily    furosemide, 20 mg, Oral, Daily    gabapentin, 300 mg, Oral, TID    glimepiride, 4 mg, Oral, QAM AC    HYDROcodone-acetaminophen, 1 tablet, Oral, Daily    hydroxychloroquine, 200 mg, Oral, BID    insulin aspart, 22 units, Subcutaneous, TID with meals (Patient taking differently: 22 units, Subcutaneous, Daily, Per sliding scale)    leflunomide, 20 mg, Oral, Daily    metoprolol tartrate, TAKE ONE TABLET BY MOUTH TWICE A DAY    sodium zirconium cyclosilicate, 10 g, Oral, Daily    testosterone, Apply 4 Act topically Daily.    triamcinolone, Apply  topically 2 times daily.    allopurinoL, 300 mg. (Patient not taking: Reported on 05/21/2022)     insulin glargine, 25 units, Subcutaneous, Q12H [facility] (Patient taking differently: Subcutaneous, Every 12 hours scheduled, Pt. Is unsure if this is the name of the new pen he got. Pt is doing a sliding scale  )    olmesartan, 20 mg, Oral, Daily    sodium bicarbonate, 650 mg, Oral, TID     Allergies: is allergic to lisinopril, sulfa (sulfonamide antibiotics), levofloxacin, and neomycin.     Family History: family history is not on file.     Social History:  reports that he has never smoked. He has never used smokeless tobacco. He reports that he does not drink alcohol and does not use drugs.     Health Maintenance   Topic Date Due    Hepatitis C Screening  Never done    Annual Wellness Visit  Never done    Urine Drug Screen  10/08/2018    COVID-19 Vaccine (5 - 2023-24 season) 02/16/2022    Tetanus Vaccine (2 - Td or Tdap) 07/03/2021    Metabolic Panel  03/28/2023    Lipid Panel  04/04/2023    Influenza Vaccine  Completed    Zoster Vaccine  Completed    Pneumococcal Vaccine: 65+ Years  Completed       REVIEW OF SYSTEMS     Denies dyspnea, light-headedness, nocturia, hematuria, dysuria, foamy urine.  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.31    Relevant Medications    sodium bicarbonate 650 mg tablet    Other Relevant Orders    Renal Function Panel -Routine    Total Protein/Creatinine Ratio Random Urine -Routine    Urinalysis with Microscopic, Culture if Indicated -Routine    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     Relevant Medications    sodium bicarbonate 650 mg tablet    Anemia of chronic renal failure, 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 On next visit Will consider starting SGLT 2 inhibitor, info provided for the same, he will consider it  Counseled on BP and DM control, avoiding NSAIDs and encouraged hydration  Medications reviewed  All questions answered and relevant labwork discussed with pt to apparent satisfaction  Follow up in 4-5 months with pre-visit lab    Renal Lesion  Status Unstable    Plan  I did explain the risk of NSF While highlighting that Per KDIGO CKD guidelines of 2012, No patients with a GFR > 30 ml/min/1.73 m2 have developed NSF without concomitant liver failure. He understands and agrees to go ahead  Management per urology    Hypertension  Status:  Well control    Plan  Goal SBP 120-130s  Continue current anti-HTN meds    Anemia  Status:  Controlled with the last hemoglobin of 10.2 g/dl    Plan  No EPO needs at this time  Follow up next visit    Secondary hyperPTH  Status:  Controlled     Plan  Follow up next visit       I addressed medical care services that are part of ongoing care related to a patient's single, serious condition or a complex condition OR serve as the continuing focal point for all needed health care services.    This dictation was produced using voice recognition software. Despite concurrent proofreading, please note transcription errors may be present  and that errors may not reflect intent.   Any portions copied from prior notes have been reviewed, repeated or edited to match the patients current presentation or left intact as applicable.    Electronically signed by:  Regino Bellow,  07/16/2022   9:46 AM

## 2022-07-23 MED ORDER — sodium bicarbonate 650 mg tablet
650 | ORAL_TABLET | Freq: Three times a day (TID) | ORAL | 3 refills | Status: DC
Start: 2022-07-23 — End: 2022-11-28

## 2022-07-23 NOTE — Telephone Encounter (Signed)
RX sent and confirmed.  Patient advised

## 2022-07-23 NOTE — Addendum Note (Signed)
Addended by: Biagio Quint on: 07/23/2022 11:57 AM     Modules accepted: Orders

## 2022-07-23 NOTE — Telephone Encounter (Signed)
Was told at last appointment 1/29 he was have a script for sodium bicarbonate sent to [facility] [address] [facility]. The pharmacy has not received prescription yet.

## 2022-08-21 ENCOUNTER — Ambulatory Visit: Admit: 2022-08-21 | Attending: Diagnostic Radiology

## 2022-08-21 DIAGNOSIS — N2889 Other specified disorders of kidney and ureter: Secondary | ICD-10-CM

## 2022-08-21 NOTE — Progress Notes (Signed)
INTERVENTIONAL RADIOLOGY CONSULT H&P     Patient name: Evan [name] MRN: [id number]  Date of Birth: [birth year]  Referring Physician: Ranell Patrick, MD  Date of service: 08/21/22     Reason for Consultation: Left renal mass  HPI & Chart Review  Evan [name] is a 77 y.o. male with notable history of HTN (controlled), CKD (stable, baseline 1.4-1.7), DM (L&S insulin, sulfonyurea, and ozempic), elevated BMI (54),  emphysema and COPD (contolled w/ trilegy and q1-2/mo rescue),  OSA complaint on home BI-pap (no at home O2).  He presents to interventional radiology for assessment and intervention of a left posterior midpole 3.3 cm kidney lesion, RCC until proven otherwise.  Patient states renal mass was an incidentaloma originally for kidney stone workup.  Patient denies taking blood thinners aside from aspirin.    Review of Systems   Constitutional: Negative for fever.   HENT: Negative for sore throat.    Respiratory: Positive for shortness of breath (w/ excertion). Negative for cough.         No at home O2 requirement, Bi-pap at night   Cardiovascular: Negative for chest pain and leg swelling.   Gastrointestinal: Negative for blood in stool.   Genitourinary: Negative for hematuria.   Musculoskeletal: Positive for joint pain (knee pain requiring walker as knee gives out).   Skin: Negative for rash.   Neurological: Negative for seizures and loss of consciousness.   Endo/Heme/Allergies: Does not bruise/bleed easily.     Allergies:  Allergies   Allergen Reactions   . Lisinopril Anaphylaxis   . Sulfa (Sulfonamide Antibiotics) Other (See Comments)     Extrasystole     . Levofloxacin Rash     Maculopapular rash   . Neomycin Rash     Past Medical History:  Past Medical History:   Diagnosis Date   . COPD (chronic obstructive pulmonary disease) (HCC)    . Diabetes mellitus, type II (HCC)    . Hypercholesteremia    . Hypertension    . Obesity, morbid [facility])      Patient Active Problem List   Diagnosis SNOMED CT(R)    . Cellulitis CELLULITIS   . Weakness generalized ASTHENIA   . Cellulitis of left lower extremity CELLULITIS OF LEFT LOWER LIMB   . Syncope and collapse SYNCOPE AND COLLAPSE   . Fall, initial encounter FALL   . Elevated troponin CARDIAC ENZYME OR MARKER ABOVE REFERENCE RANGE   . Uncontrolled type 2 diabetes mellitus with stage 3 chronic kidney disease, with long-term current use of insulin INSULIN TREATED TYPE 2 DIABETES MELLITUS   . COPD exacerbation (HCC) ACUTE EXACERBATION OF CHRONIC OBSTRUCTIVE AIRWAYS DISEASE   . Sepsis due to undetermined organism (HCC) SEPSIS   . Fever FEVER   . CAD (coronary artery disease) CORONARY ARTERIOSCLEROSIS   . Stage 3a chronic kidney disease (HCC) CHRONIC KIDNEY DISEASE STAGE 3A   . Anemia of chronic renal failure, stage 3a  (HCC) ANEMIA OF CHRONIC RENAL FAILURE   . Renal cyst CYST OF KIDNEY   . Hyperkalemia HYPERKALEMIA   . Secondary hyperparathyroidism (HCC) SECONDARY HYPERPARATHYROIDISM   . Metabolic acidosis METABOLIC ACIDOSIS   . Primary hypertension ESSENTIAL HYPERTENSION     No past surgical history on file.  Family & Social Hx:  No family history on file.  Social History     Socioeconomic History   . Marital status: Married     Spouse name: Not on file   . Number of children: Not on file   .  Years of education: Not on file   . Highest education level: Not on file   Occupational History   . Not on file   Tobacco Use   . Smoking status: Never   . Smokeless tobacco: Never   Substance and Sexual Activity   . Alcohol use: No   . Drug use: No   . Sexual activity: Not on file   Other Topics Concern   . Not on file   Social History Narrative   . Not on file     Social Determinants of Health     Financial Resource Strain: Low Risk  (04/19/2021)    Financial Resource Strain    . Difficulty of Paying Living Expenses: Not on file    . Access to Reliable Phone: Not on file   Food Insecurity: Not on file   Transportation Needs: Not on file   Physical Activity: Not on file   Stress: Not  on file   Social Connections: Not on file   Intimate Partner Violence: Unknown (04/19/2021)    Intimate Partner Violence    . Fear of Current or Ex-Partner: Not on file    . Emotionally Abused: Not on file    . Physically Abused: Not on file    . Sexually Abused: Not on file    . Questions Apply to Other Individual: Not on file   Housing Stability: Not on file     Current Outpatient Medications   Medication Sig Dispense Refill   . albuterol (PROAIR HFA) 90 mcg/actuation inhaler INHALE ONE PUFF BY MOUTH EVERY 4 HOURS AS NEEDED     . amLODIPine (NORVASC) 2.5 MG tablet Take 5 mg by mouth once daily.     [name] ammonium lactate (LAC-HYDRIN) 12 % lotion Apply 12 g topically as needed for Dry skin.     [name] aspirin 81 MG EC tablet 81 mg.     . atorvastatin (LIPITOR) 40 mg tablet Take 40 mg by mouth once daily.     . custom medication every night at bedtime. Med Name: cpap     . cyanocobalamin (VITAMIN B-12) 1000 MCG tablet once daily.     . diclofenac (VOLTAREN ARTHRITIS PAIN) 1 % topical gel Apply topically daily as needed. Effected area     . fluticasone-umeclidinium-vilanterol (TRELEGY ELLIPTA) 200-62.5-25 mcg inhaler Inhale 1 puff into the lungs once daily.     . furosemide (LASIX) 20 mg tablet TAKE 1 TABLET BY MOUTH EVERY DAY 90 tablet 2   . gabapentin (NEURONTIN) 300 mg capsule Take 300 mg by mouth 3 times daily.     [name] glimepiride (AMARYL) 4 MG tablet Take 4 mg by mouth every morning before breakfast.     . HYDROcodone-acetaminophen (NORCO) 10-325 mg tablet Take 1 tablet by mouth once daily.     . hydroxychloroquine (PLAQUENIL) 200 mg tablet Take 200 mg by mouth 2 times daily.     [name] leflunomide (ARAVA) 20 mg tablet Take 20 mg by mouth once daily.     . metoprolol tartrate (LOPRESSOR) 50 MG tablet TAKE ONE TABLET BY MOUTH TWICE A DAY     . olmesartan (BENICAR) 40 mg tablet Take 20 mg by mouth once daily. Unknown dosage     . sodium bicarbonate 650 mg tablet Take 1 tablet by mouth 3 times daily. Take 2 am, 1 pm total of 3 per  day 270 tablet 3   . sodium zirconium cyclosilicate (LOKELMA) 10 gram oral powder packet Take 10 g by  mouth once daily.     [name] testosterone (ANDROGEL) 12.5 mg/ 1.25 gram (1 %) GlPm Apply 4 Act topically Daily.     [name] triamcinolone (KENALOG) 0.1 % cream Apply  topically 2 times daily.     [name] allopurinol (ZYLOPRIM) 300 MG tablet 300 mg. (Patient not taking: Reported on 05/21/2022)     . insulin aspart (NOVOLOG) 100 unit/mL PEN Inject 22 Units under the skin 3 times daily with meals. (Patient taking differently: Inject 22 units under the skin once daily. Per sliding scale) 1 pen 0   . insulin glargine (LANTUS) 100 unit/mL injection Inject 25 Units under the skin every 12 hours. (Patient taking differently: Inject under the skin every 12 hours. Pt. Is unsure if this is the name of the new pen he got. Pt is doing a sliding scale) 1 vial S+365     No current facility-administered medications for this visit.       OBJECTIVE  Vitals:    08/21/22 1438   BP: 160/84     PHYSICAL EXAM:   Constitutional: Sitting on stretcher with walker, obese, appropriate disposition, NAD  HEENT: ATNC, pupils equal, sclera white   CARD: RRR  PULM: CTAB,  normal WOB  SKIN: Warm, dry, no rash  MSK / EXT:  left radial pulse 1+ (thick wrists difficult to palp), no UE edema.  NEURO: A&Ox4, normal comprehension/speech, symmetrical facial tone.   PSYCH: conversational, normal affect & mood.    Labs  Lab Results   Component Value Date    WHITEBLOODCE 9.2 08/14/2016    HGB 10.2 (L) 08/14/2016    HCT 30.7 (L) 08/14/2016    MCV 99.9 (H) 08/14/2016    LABPLAT 198 08/14/2016     Lab Results   Component Value Date    INR 1.1 07/11/2016    PROTIME 12.4 07/11/2016     Lab Results   Component Value Date    NA 140 08/14/2016    K 4.0 08/14/2016    CL 102 08/14/2016    CO2 27.4 08/14/2016    BUN 41 (H) 08/14/2016    CREATININES 1.93 (H) 08/14/2016    GLU 144 (H) 08/14/2016    CALCIUM 9.1 08/14/2016    AST 18 08/14/2016    ALT 24 08/14/2016    ALKPHOS 58 08/14/2016     PROT 6.2 08/14/2016    ALBUMIN 3.4 (L) 08/14/2016    ANIONGAP 10.6 08/14/2016    LABGLOM 34 (L) 08/14/2016    GFRADDINFO  08/14/2016      Comment:      For additional information, please visit the National Kidney Disease Education Program (NKDEP) at [email or web address]    AGRATIO 1.2 08/14/2016         PERTINENT IMAGING - see primary source for full report    Results for orders placed during the hospital encounter of 12/20/20      EXAM:   CT ABDOMEN WITHOUT AND WITH, CT PELVIS WITH CONTRAST     EXAM DATE: 04/23/2022 10:30 AM     CLINICAL HISTORY: CT RENAL MASS PROTOCOL ? ICD-10-CM - N28.89 Other specified disorders of kidney and ureter.     COMPARISONS: CT ANGIOGRAM PULMONARY 07/13/2016 3:33 PM   CT CHEST LUNG CANCER SCREENING 05/31/2020 9:49 AM   CT RENAL STONE WO CONTRAST 03/27/2022 7:48 PM.     TECHNIQUE: Routine helical CT imaging was performed through the abdomen before administration of IV contrast and through the abdomen and pelvis with IV contrast: 80 ML OMNI  350. Enteric contrast: None. Reconstructions: Coronal and sagittal.     In accordance with CT protocol optimization, one or more of the following dose reduction techniques were utilized for this exam: automated exposure control, adjustment of mA and/or KV based on patient size, or use of iterative reconstructive technique.     FINDINGS:   Lung Bases: Unremarkable.     Liver: Normal. No masses.     Gallbladder/Bile Ducts: Unremarkable.     Spleen: Normal.     Pancreas: Normal.     Adrenal Glands: Mild nodular thickening of the adrenal glands noted but no significant nodule.     Kidneys: Bilateral renal cysts are identified, the largest measuring 4 cm in the left inferior renal cortex. This cyst is slightly hyperdense but does not enhance. There is a subtle 3.4 x 3.3 cm cortical lesion in the posterior left mid renal cortex   (series 301, image 56) which is suspicious.     Peritoneal Cavity/Bowel: Normal. No free fluid, free air or adenopathy. No masses  or acute inflammatory process. The appendix is well visualized and normal.     Pelvic Organs: Normal. The bladder and visualized pelvic organs are within normal limits.     Vasculature: Mild aneurysm dilatation of the infrarenal abdominal aorta has a max diameter of 3.5 cm, stable.     Bones: Moderate spondylosis in the lumbar spine is present.     Other: None.     IMPRESSION:   IMPRESSION:   Nonenhancing cyst in the kidneys.     Left inferior pole lesion has no enhancement and is not suspicious.     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 Consider renal MRI with and without IV contrast and urological consultation.     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 Consider follow-up CT abdomen/pelvis or aortic ultrasound in 12 months to document stability.     Other findings as above.   RADIA     Dictated By: Hedda Slade MD 2022-04-23 12:42:55.407   ____    MRI ABDOMEN WWO 07/20/2022 10:30 AM PROVIDED CLINICAL INDICATIONS: 77 year old male: Disorders of kidney and ureter. Renal mass. History of hernia 30 years ago. No history of cancer or injury. Other specified disorders of kidney and ureter ADDITIONAL CLINICAL HISTORY: None. COMPARISON: CT from 04/23/2022. TECHNIQUE: Multiplanar, multisequence magnetic resonance imaging (MRI) of the abdomen is performed before and after administration of contrast. Post contrast imaging includes multiphasic acquisitions. Gadavist 12ml Contrast administered intravenously. FINDINGS: In the posterior interpolar region of the left kidney, there is a heterogeneous enhancing mass measuring 3.3 x 2.7 x 3.2 cm, suspicious for neoplasm. This does not extend into the renal sinus and no renal vein invasion is seen. Bilateral nonenhancing renal cysts are also noted. There is no collecting system dilatation on either side. No other suspicious masses are identified within the abdomen. The other abdominal  organs are unremarkable. There is no free fluid. No enlarged lymph nodes are identified. A mild infrarenal abdominal aortic aneurysm is unchanged from prior CT.     IMPRESSION: 1. Suspicious mass in the posterior mid left kidney measuring up to 3.3 cm, which should be considered renal cell carcinoma until proven otherwise. 2. No evidence of metastatic disease in the abdomen or other suspicious findings. Report Reviewed and Electronically Signed by: [place], [place] [facility] Report Signed on: 07/20/2022 16:38:19 PST  ASSESSMENT - MDM   Diagnosis:     ICD-10-CM    1. Left renal mass  N28.89         Evan [name] is a 77 y.o. male has a pertinent history of HTN (controlled), CKD (stable, baseline 1.4-1.7), DM (L&S insulin, sulfonyurea, and ozempic), elevated BMI (54),  emphysema and COPD (contolled w/ trilegy and q1-2/mo rescue),  OSA complaint on home BI-pap (no at home O2).  He presents for consultation regarding a left posterior midpole 3.3 cm kidney lesion, seen on CT and MRI, RCC until proven otherwise.  Discussed alternative management with the patient including the increasing risk of metastases as the lesion grows.  This mass is amenable to a two-step process of embolization followed by cryoablation.  Biopsy is recommended at time of procedure with differentials to include AML, TSC and benign renal oncocytoma, regardless of results. Patient understands and agrees with assessment and plan.      Plan   LEFT Renal Mass Embolization followed by LEFT Cryoablation with biopsy.     Step 1: Selective EMBOLIZATION of the Left Renal mass:  ? Proceduralist: Dr Alberteen Spindle or Ermelinda Das only  ? Location: [facility] per patient request.  ? Sedation Plan: MAC / GA per anesthesiologist  ? NPO 8+ hrs  ? Anticoagulants: None   ? Antiglycemics: Hold Glimepiride and short acting insulin (NPO)  ? Labs: per routine + BMP  ? Schedule Cryo 2-4 weeks after embolization.     Step 2: Left Renal MASS  CRYOABLATION and Biopsy:   ? Proceduralist: Alberteen Spindle or Ermelinda Das only  ? Location: [facility] per patient request.  o Plan for possible same day discharge vs hospital admission if needed  ? Sedation plan: GA / MAC, per anesthesiologist  ? Pre-op:  o Pertinent Allergies: NKA  o NPO 8+ hrs  o Anticoagulants: none  o Antiglycemics: Anti-glycemics: Hold Glimepiride and short acting insulin (NPO)  o Labs: obtain same-day labs    PARQ: CONSENT & EDUCATION  The procedure, alternatives, risks and benefits were discussed with the patient as well as the need for follow-up monitoring. Expected outcomes were discussed (without guarantee of tumor recurrence) and benefits vs risks; risks including but not limited to: infection and bleeding risk / spontaneous hemorrhage, damage to renal tissue (potentially reducing overall function) and undesired damage to surrounding structures (ureters, lung space puncture), discussed postoperative pain (commonly improving within 72 hrs), and if contrast is used discussed contrast hypersensitivity reactions and possible nephropathy. After this discussion all questions were answered, informed consent was obtained.     ________________________  - The following portions of the patient's history were reviewed and updated as appropriate: allergies, current medications, past family history, past medical history, past surgical history, problem list, relevant labs and imaging.    - 50 minutes of face-to-face time was spent with the patient with >50% of time spent in counseling and care coordination.  - Electronically signed by: Julian Hy. Quintana Canelo, MSPAS, PA-C, R.T.(R) on 08/21/2022, and above case discussed in collaboration with IR Dr. Alberteen Spindle of [facility].    Thank you for allowing [place] to participate in the care of this patient.

## 2022-08-21 NOTE — Addendum Note (Signed)
Addended by: Eulah Pont on: 08/22/2022 10:26 AM     Modules accepted: Orders

## 2022-10-04 NOTE — Discharge Summary (Signed)
DISCHARGE SUMMARY      Pt. Name/Age/DOB:  Evan [name]    77 y.o.   [birth year]         Medical Record Number:  [id number]  Date of Admission:  10/03/2022         Date of Discharge:  10/04/2022    Admitting Physician:  Michel Harrow, *         PCP:  Lovenia Shuck. 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 FISTULA, INTESTINOVESICAL   . SPUR, CALCANEAL   . SLOWING, URINARY STREAM   . HYPERTENSION, BENIGN ESSENTIAL   . Arthralgia   . Obstructive sleep apnea syndrome   . History of Bell's palsy   . GOUT   . DYSPNEA   . Chronic obstructive pulmonary disease    . Osteoarthritis of both knees   . CRI (chronic renal insufficiency)   . Seborrheic keratosis   . Low serum testosterone level   . Type 2 diabetes mellitus with other diabetic kidney complication    . Nummular eczema   . Hyperlipidemia   . Sebaceous cyst   . Type 2 diabetes mellitus with mild nonproliferative diabetic retinopathy without macular edema    . Shoulder impingement, left   . Morbid obesity    . Venous stasis of both lower extremities   . Chronic right-sided heart failure    . Rheumatoid arthritis involving multiple sites    . Diabetic polyneuropathy associated with type 2 diabetes mellitus    . Chronic use of opiate drug for therapeutic purpose   . Coronary artery disease involving native coronary artery of native heart without angina pectoris   . Vascular disorders of kidney   . Gross hematuria           Secondary Discharge Dx:    Chronic kidney disease  Essential hypertension  Chronic venous stasis bilateral lower extremities  Heart failure with preserved EF, compensated  Coronary artery disease  Obstructive sleep apnea on CPAP  Diabetes mellitus type 2 on insulin  Morbid obesity    Pertinent Diagnostic Info/Data:  None    Procedures:    None    Reason for Admission (Brief):   Hematuria and clots    Hospital Course, including Complications:  He is a 77 year old male with a past medical history of essential hypertension, chronic venous stasis of bilateral lower extremities, chronic right heart failure, CAD, obstructive sleep apnea, history of Bell's palsy, gout, COPD, chronic renal insufficiency, type 2 diabetes with insulin dependence, morbid obesity who presented to the hospital today for IR procedure for left renal cryoablation for suspected RCC and s/p CT-guided ablation of renal mass, left-sided of a known neoplasm and recent history of Successful transatrial embolization of known neoplasm on 09/05/2022.  Continue, patient started having hematuria with clots and was recommended admission to our service for overnight observation.  Overnight,  he stopped having gross hematuria and did not pass any clots since yesterday evening.  H&H was stable this morning so, discharge normal discussed with patient and he was cleared to go home.  He was recommend outpatient follow-up with primary care physician and IR as previously recommended.     Medications Reconciled upon Discharge are:      Discharge Medications  TAKE these medications AS PRESCRIBED. Do not change the dosage on your own. Call your health care provider if you have questions.        Details   ammonium lactate 12% cream   Apply to toes once daily.[name]  aka: LAC-HYDRIN     testosterone 12.5 mg/1.25 g actuation (1%) gel   APPLY 4 PUMPS TO THE SKIN EVERY DAY.            Unchanged Medications        Details   albuterol 90 mcg/puff inhaler   INHALE ONE PUFF BY MOUTH EVERY 4 HOURS AS NEEDED.  aka: PROAIR HFA     Ambulatory Compound Builder   Glucometer, test strips, lancets, alcohol swabs.[name]     amLODIPine 2.5 mg tablet   Take 2 tablets by mouth Daily.  aka: NORVASC     aspirin 81 MG EC tablet   Take 81 mg by mouth Daily.     atorvaSTATin 40 mg tablet   TAKE 1 TABLET BY MOUTH EVERY EVENING.  aka: LIPITOR     cyanocobalamin 1,000 mcg  tablet   Take 1,000 mcg by mouth 2 times daily.  aka: VITAMIN B-12     diclofenac 1% Gel   Apply 4 g topically 4 times daily as needed (for pain and inflammation). Apply to the affected area up to 4 times daily.  aka: VOLTAREN     FIASP 100 UNIT/ML injection (vial)  Generic drug: insulin aspart-niacinamide   Inject 10 Units under the skin 3 times daily (with meals).     furosemide 20 mg tablet   Take 1 tablet by mouth Daily.  aka: LASIX     gabapentin 300 mg capsule   Take 1 capsule in the morning, 1 capsule in the afternoon and 2 capsules at night..  aka: NEURONTIN     glimepiride 4 mg tablet   Take 1 tablet by mouth every morning (before breakfast).  aka: AMARYL     insulin pen needle 30G X 8 MM Misc   100 each by Other route 2 times daily.     INSULIN SYRINGE 1CC/31GX5/16 31G X 5/16 1 ML Misc   Use to inject insulin twice daily.[name]     leflunomide 20 mg tablet   Take 1 tablet by mouth Daily.  aka: ARAVA     LOKELMA 10 g packet  Generic drug: sodium zirconium cyclosilicate   Take 1 packet by mouth Every other day.     metoprolol tartrate 50 mg tablet   TAKE 1 TABLET BY MOUTH TWICE DAILY.  aka: LOPRESSOR     Needle (Disp) 30G X 5/16 Misc   1 each by Does not apply route Daily.     olmesartan 40 MG tablet   Take 1 tablet by mouth Daily.  aka: BENICAR     ONETOUCH VERIO strip  Generic drug: glucose blood VI test strips   Use to check blood sugar 4 times daily. Uses insulin.     ONETOUCH VERIO w/Device Kit   Use to check blood glucose 4 times daily. On insulin.     OZEMPIC (0.25 OR 0.5 MG/DOSE) 2 MG/3ML injection (2 mg/3 mL)  Generic drug: semaglutide   INJECT 0.5MG  UNDER THE SKIN ONCE A WEEK     predniSONE 10 mg tablet   TAKE 1 TABLET BY MOUTH TWICE DAILY AS NEEDED FOR RHEUMATOID ARTHRITIS FLARES  aka: DELTASONE     sodium bicarbonate 650 mg tablet   DISSOLVE AND TAKE ONE TABLET  BY MOUTH TWICE DAILY.     TRELEGY ELLIPTA 200-62.5-25 MCG/ACT inhaler  Generic drug: fluticasone-umeclidinium-vilanterol   INHALE 1 PUFF  INTO THE LUNGS DAILY     triamcinolone 0.1% cream   APPLY TOPICALLY TWO TIMES A DAY.  aka: KENALOG     UNCODED DME   Circaid Juxalite edema wraps for LE  Apply to both lower extremities daily.     UNCODED DME   Autoset BIPAP - set to 23/18 CWP, heated, humidity and all supplies. Nasal Mask.   Length of need: 99  Diagnosis: OSA  PAP supplies with replacements as follows or as insurance permits:  Heated Humidifier    Mask interface, 1 per 3 months  Cushion replacement, 2 per month  Pillow set, replacement, 2 set per month  Full face mask, 1 per 3 months  Full face cushion replacement, 1 per month  Headgear, 1 per 6 months  Heated/nonheated tubing, 1 per 3 months  Filter, disposable, 2 per month  Filter, nondisposable, 1 per 6 months  Chinstrap, 1 per 6 months  Humidifier chamber, 1 per 6 months.  Multiple mask types worn   DME: [employer]    Electronically Signed by Sydnee Cabal   NPI - [id number].            CONTINUE taking these medications. Your health care provider approved your dosage change. Follow the directions below.        Details   insulin glargine 300 units/mL concentrated injection (pen)   Inject 50-60 Units under the skin every 12 hours.  akaLaurette Schimke              Immunization History   Administered Date(s) Administered   . COVID-19 (PFIZER) MONOVALENT 12 YRS+ (ORIGINAL) 09/21/2019, 10/16/2019, 05/23/2020   . COVID-19 (PFIZER) MONOVALENT, 12 YRS+ (COMIRNATY) 01/02/2021   . INFLUENZA (FLUBLOK) TRIV 18y+ PF 04/17/2011, 03/18/2012   . INFLUENZA 65 Y OR >, TRIVALENT HIGH-DOSE 03/22/2013, 03/28/2016   . INFLUENZA PF 65Y+ QUADRIVALENT (FLUAD) 03/20/2021   . INFLUENZA PF 65Y+ QUADRIVALENT HIGH-DOSE 04/06/2020   . INFLUENZA PF TRIVALENT(PED/ADOL/ADULT), PSKT 04/17/2011, 03/18/2012   . INFLUENZA PF, QUADRIVALENT 03/18/2014, 03/21/2015, 04/09/2017, 04/16/2018, 03/31/2019   . INFLUENZA TRIV W/PRES(PED/ADOL/ADULT),MULTIDOSE 03/30/2013   . INFLUENZA, UNSPECIFIED FORMULATION 03/24/2008, 03/14/2009,  03/14/2010   . PNEUMOCOCCAL CONJUGATE 13-VALENT (PCV13) 04/01/2014   . PNEUMOCOCCAL POLYSACCHARIDE 23-VALENT (PPSV23) 11/12/2007, 03/16/2013   . TDAP, (ADOL/ADULT) 07/04/2011   . ZOSTER (ZOSTAVAX) LIVE 05/24/2009   . ZOSTER NON-LIVE [facility]) 10/22/2017, 01/14/2018          Reason for major medication changes in hospital:  Per HPI    Condition on Discharge: Stable            Vital Signs:  Temp: 36.6 ?C (97.9 ?F) BP: 179/85 Pulse: 69 Resp: 20 SpO2: 91 %   Min/Max Temp past 24 hours:Temp  Avg: 36.3 ?C (97.4 ?F)  Min: 35.8 ?C (96.5 ?F)  Max: 36.6 ?C (97.9 ?F)    Intake/Output Summary (Last 24 hours) at 10/04/2022 0922  Last data filed at 10/04/2022 0300  Gross per 24 hour   Intake 2226 ml   Output 2975 ml   Net -749 ml      Last Wt. Before discharge: Weight: (!) 187.8 kg (414 lb)     Physical Exam  Vitals and nursing note reviewed. Exam conducted with a chaperone present.   Constitutional:       Appearance: Normal appearance. He is obese. He is not ill-appearing.   Cardiovascular:  Rate and Rhythm: Normal rate and regular rhythm.   Pulmonary:      Effort: Pulmonary effort is normal.      Breath sounds: Normal breath sounds.   Abdominal:      General: Bowel sounds are normal. There is no distension.      Palpations: Abdomen is soft.      Tenderness: There is no abdominal tenderness.   Neurological:      General: No focal deficit present.      Mental Status: He is alert and oriented to person, place, and time. Mental status is at baseline.        Pending study results on DC:  None  Disposition:  Home  Follow-Up Plans:         Follow-up with:  With PCP       Diet:  Carb controlled Calorie restricted diet      Activity:  As tolerated      No discharge procedures on file.       Code Status/Advance Directive (Pertinent discussions/declarations):  Full Code by default - TBD    Time spent on Discharge and Coordination of post-hospital care: 42  minutes    Electronically signed by:    Auburn Bilberry, MD, 10/04/2022 9:22 AM PDT    [facility]

## 2022-10-05 NOTE — Telephone Encounter (Signed)
Dr. Sampson Si saw patient on 4/17 where he successfully ablated patient renal lesion. Per protocol patient will get a CT scan in 3 months to see how treatment is going. LM for patient to see how he is doing and to let him know when to expect his next scan. Will await call back or try again later.             IMPRESSION:    Successful CT-guided cryoablation of left midpole renal tumor.    Successful CT-guided biopsy of right upper pole tumor with two 20-gauge core samples collected and submitted to pathology.    Plan: Follow-up multiphasic renal mass protocol CT in 3 months to assess response to therapy.    Dictated By: Ermelinda Das MD 2022-10-03 14:35:23.9

## 2022-10-09 NOTE — Telephone Encounter (Signed)
Spoke with patient and he let me know he is doing good after his LT Renal Cryoablation. No questions or concerns at this time. Patient did advise me that he spoke with Dr. Davol yesterday anJillyn LedgerDr. Davol oJillyn Ledgerered an MRI to take place in 6 months to see how response to therapy went. I advised that I will cancel our CT and follow Dr. Davol'sTheora Masterers. Patient expressed understanding and call was ended mutually.     Spoke with Dr. [name] office who confirmed this.

## 2022-10-19 ENCOUNTER — Other Ambulatory Visit: Payer: Self-pay | Admitting: Family Medicine

## 2022-10-23 NOTE — Telephone Encounter (Signed)
He should be able to purchase this OTC.

## 2022-10-24 NOTE — Telephone Encounter (Signed)
Patient informed and will get medication over the counter.

## 2022-10-30 NOTE — Progress Notes (Signed)
Subjective:     Pt. ID: Evan [name] is a 77 y.o. male    Chief Complaint   Patient presents with   . Follow-up     3 months DM      History of Present Illness  The patient presents for evaluation of diabetes.    DM---The patient's blood glucose levels have been fluctuating, with readings typically higher in the morning and lower at night. He is currently on a regimen of 30 units of short-acting insulin twice daily, with a 10 mL vial lasting for 25 days. He expresses a desire to discontinue Ozempic, citing high co-pay of 230 dollars per month. He has been off Ozempic for approximately 6 weeks, as advised by his physician to discontinue it 2 weeks prior to his cardiac ablation procedure. His current regimen includes Toujeo, administered at 60 units when his blood glucose levels are between 170 and 180. However, if his blood glucose levels remain elevated in the 140s and 150s, he adjusts his Toujeo dosage to 50 units. Additionally, he takes Aspart insulin twice daily. His blood glucose level was 173 this morning. Over the past 2 months, his highest recorded blood glucose level was 178. His last ophthalmological examination was conducted in 04/2022 or 05/2022, with no reported retinopathy noted.      HPI    Review of Systems   Constitutional:  Negative for activity change and fatigue.   HENT:  Negative for congestion, ear pain and sore throat.    Eyes:  Negative for pain.   Respiratory:  Negative for shortness of breath.    Gastrointestinal:  Negative for abdominal pain.   Musculoskeletal:  Negative for arthralgias and myalgias.   Skin:  Negative for rash.   Neurological:  Negative for headaches.   Psychiatric/Behavioral:  Negative for behavioral problems. The patient is not nervous/anxious.          Objective:     Physical Exam  Constitutional:       General: He is not in acute distress.     Appearance: Normal appearance. He is normal weight. He is not ill-appearing.   HENT:      Head: Normocephalic  and atraumatic.      Nose: Nose normal.   Eyes:      Pupils: Pupils are equal, round, and reactive to light.   Pulmonary:      Effort: Pulmonary effort is normal.   Musculoskeletal:         General: Normal range of motion.      Cervical back: Normal range of motion and neck supple.   Skin:     General: Skin is warm and dry.   Neurological:      Mental Status: He is alert.   Psychiatric:         Mood and Affect: Mood normal.         Behavior: Behavior normal.                Assessment and Plan:       1. Type 2 diabetes mellitus with other diabetic kidney complication (HCC)  Assessment & Plan:  - Stop Amaryl  - Stop Ozempic  - Continue insulin Toujeo 50 or 60 units twice a day  - Continue insulin aspart 30 units twice a day  - Follow-up 3 months    Orders:  -     insulin aspart-niacinamide (FIASP) 100 units/mL injection (vial); Inject 30 Units under the skin 2 times daily for 90 days.  Dispense: 54 mL; Refill: 0  -     POCT Hemoglobin A1c         Electronically signed by: Evan [name]. Evan [name], [place] 10/30/2022 2:33 PM PDT    All pertinent side effects, risks, benefits and precautions of suggested treatments were duscussed in detail. Evan [name] understands and agrees with above treatment plan.         Advance care planning discussed today,Evan [name] has identified   Trusted Decision Maker: Evan [name] - Spouse - [phone] as a healthcare representative if needed.  Contact information obtained in chart updated per his request.

## 2022-11-23 ENCOUNTER — Telehealth: Payer: Self-pay | Admitting: Cardiology

## 2022-11-23 DIAGNOSIS — I10 Essential (primary) hypertension: Secondary | ICD-10-CM

## 2022-11-23 MED ORDER — AMLODIPINE BESYLATE 5 MG PO TABS
5.0000 mg | ORAL_TABLET | Freq: Every day | ORAL | 1 refills | Status: AC
Start: 2022-11-23 — End: ?

## 2022-11-23 NOTE — Addendum Note (Signed)
Addended by: Tyrone Balash, [place] M on: 11/23/2022 01:54 PM   Modules accepted: Orders

## 2022-11-23 NOTE — Telephone Encounter (Signed)
*  STAT* If patient is at the pharmacy, call can be transferred to refill team.   1. Which medications need to be refilled? (please list name of each medication and dose if known) amLODipine (NORVASC) 5 MG tablet   2. Which pharmacy/location (including street and city if local pharmacy) is medication to be sent to? [facility] [id number] - [place], [place] - [address]  3. Do they need a 30 day or 90 day supply? 90

## 2022-11-26 ENCOUNTER — Encounter: Payer: MEDICARE | Attending: Student in an Organized Health Care Education/Training Program

## 2022-11-27 ENCOUNTER — Encounter: Payer: MEDICARE | Attending: Student in an Organized Health Care Education/Training Program

## 2022-11-27 NOTE — Progress Notes (Deleted)
[facility]  Follow-up Note    Evan [name]  July 31, 1945    HISTORY OF PRESENT ILLNESS:     Chief Complaint:  76yoM w CKD3b, HTN, DM, HFpEF, CAD, OSA on CPAP, COPD, RA presents for CKD follow up    Last OV 05/2022    Interval history  Had a stone, presented to ER, imaging showed ?RCC, getting MRI this Friday had to be rescheduled a few times due to problems w MRI machine  Drinking lots of fluids--about a gallon per day    HTN history  Diagnosed 2018  Takes amlodipine 5, olmesartan 40, metop 50bid, lasix 20  Prior BP logs: 130/80s range    DM history  T2DM  Diagnosed 2018  On insulin  No h/o retinopathy    Renal history  No prior h/o UTIs, leg ulcers/PVD  No recent NSAID/PPI use, COVID infection or stone  Remote h/o smoking    Previous labs (adapted from prior providers):  09/2022 creatinine 1.6, GFR 44   05/2022 Cr 1.73, GFR 40,   04/2022 Cr 1.7, GFR 41, Bic 20, K 5.3, UPCR 1.47  01/17/22  BUN 44 creatinine 1.74 GFR 48 sodium 143 potassium 4.5 bicarbonate 21 glucose 310 albumin 3.2 calcium 8.5 phosphorus 3.6 PTH 145  Hemoglobin 12.5  Microalbumin to creatinine ratio 692  A1c 6.5%  07/07/21 Cr 1.84, GFR 38, K 5.7, Glu 284,  Hb 11.5, UA w neg pro, blood, 0-2W, R, UPCR 0.5  12/08/19 sodium 145 potassium 4.1 chloride 112 carbon dioxide 19 anion gap 14 glucose 149 BUN 35 creatinine 1.68 eGFR 40 cc/min calcium 8.8 phosphorus 4.1 albumin 3.26, /22/21 UACR 220 ug/mg  03/27/19 Hemoglobin 12.5 Sodium 142 Potassium 4.3 Chloride 109 Carbon dioxide 21 Anion gap 12 Glucose 150 BUN 38 Creatinine 1.58 eGFR 43 cc/min Calcium 9.1 Phosphorus 4.5 Albumin 3.8 UACR 107 ug/mg  08/07/18 BUN 39 creatinine 1.63 eGFR 42 cc/m  04/03/18  BUN 35 creatinine 1.51 eGFR 46 cc/min Urine protein 11 Urine creatinine 29 UPC 0.4 g/day 25-OH Vitamin D 29.2  11/14/17 BUN 35 creatinine 1.51 eGFR 46 cc/min  04/24/17 creatinine 1.67  12/31/16 creatinine 1.75  11/22/16 creatinine 1.46  11/20/16 creatinine 1.41  11/08/16 creatinine 1.40  08/20/16 creatinine  1.69  12/01/15 creatinine 1.37  11/08/14 creatinine 1.21  04/16/14 creatinine 1.40  11/05/13 creatinine 1.10    Imaging:  Last renal imaging: [place] 2018 R kidney 13.8cm, L kidney 13.8cm, PVR not measured    CT  Left inferior pole lesion has no enhancement and is not suspicious.     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 Consider renal MRI with and without IV contrast and urological consultation.     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 Consider follow-up CT abdomen/pelvis or aortic ultrasound in 12 months to document stability.          Past Medical History:  has a past medical history of COPD (chronic obstructive pulmonary disease) (HCC), Diabetes mellitus, type II (HCC), Hypercholesteremia, Hypertension, and Obesity, morbid (HCC).     Past Surgical History:  has no past surgical history on file.       Outpatient Medications as of 11/27/2022:   .  albuterol, INHALE ONE PUFF BY MOUTH EVERY 4 HOURS AS NEEDED  .  allopurinoL, 300 mg. (Patient not taking: Reported on 05/21/2022)  .  amLODIPine, 5 mg, Oral, Daily  .  ammonium lactate, 12 g, Topical,  PRN  .  aspirin, 81 mg.  .  atorvastatin, 40 mg, Oral, Daily  .  custom medication, every night at bedtime. Med Name: cpap  .  cyanocobalamin, once daily.  .  diclofenac, Apply topically daily as needed. Effected area  .  TRELEGY ELLIPTA, 1 puff, Inhalation, Daily  .  furosemide, 20 mg, Oral, Daily  .  gabapentin, 300 mg, Oral, TID  .  glimepiride, 4 mg, Oral, QAM AC  .  HYDROcodone-acetaminophen, 1 tablet, Oral, Daily  .  hydroxychloroquine, 200 mg, Oral, BID  .  insulin aspart, 22 units, Subcutaneous, TID with meals (Patient taking differently: 22 units, Subcutaneous, Daily, Per sliding scale)  .  insulin glargine, 25 units, Subcutaneous, Q12H [facility] (Patient taking differently: Subcutaneous, Every 12 hours scheduled, Pt. Is unsure if this is the name of the new pen he got. Pt is doing a  sliding scale  )  .  leflunomide, 20 mg, Oral, Daily  .  LOKELMA, TAKE 1 POWDER IN PACKET BY MOUTH DAILY  .  metoprolol tartrate, TAKE ONE TABLET BY MOUTH TWICE A DAY  .  olmesartan, 20 mg, Oral, Daily  .  sodium bicarbonate, 650 mg, Oral, TID  .  testosterone, Apply 4 Act topically Daily.  [name]  triamcinolone, Apply  topically 2 times daily.     Allergies: is allergic to lisinopril, sulfa (sulfonamide antibiotics), levofloxacin, and neomycin.     Family History: family history is not on file.     Social History:  reports that he has never smoked. He has never used smokeless tobacco. He reports that he does not drink alcohol and does not use drugs.     Health Maintenance   Topic Date Due   . Annual Wellness Visit  Never done   . Diabetes: Foot Exam  Never done   . Diabetes: Retinopathy Screening  Never done   . RSV Vaccine (1 - 1-dose 60+ series) Never done   . Urine Drug Screen  10/08/2018   . Diabetes: Urine Protein Screening  12/14/2020   . COVID-19 Vaccine (5 - 2023-24 season) 02/16/2022   . Tetanus Vaccine (2 - Td or Tdap) 07/03/2021   . Lipid Panel  04/04/2023   . Diabetes: Hemoglobin A1c  05/02/2023   . Metabolic Panel  10/04/2023   . Influenza Vaccine  Completed   . Zoster Vaccine  Completed   . Hepatitis C Screening  Completed   . Pneumococcal Vaccine: 65+ Years  Completed       REVIEW OF SYSTEMS     Denies dyspnea, light-headedness, nocturia, hematuria, dysuria, foamy urine.  Has chronic LL edema, stable  Has urinary urgency and dribbling    PHYSICAL EXAM     There were no vitals taken for this visit.    GEN: NAD  LUNGS: Clear at BL bases  CV: RRR, S1 S2 heard, no murmur or rub  EXT: No LE Edema  NEURO: grossly normal, no focal findings    ASSESSMENT AND PLAN       Problem List Items Addressed This Visit    None            Chronic Kidney Disease   Status: Stable  Baseline Cr: 1.4-1.7  Likely cause: HTN/DM  Imaging: Uptodate  Urine protein: ++  ACE/ARB: Taking  SGLT2 inh: NotTaking    Plan  Stable renal  function  No hematuria, but does have proteinuria Has risen from prior. On next visit Will consider starting SGLT 2 inhibitor, info  provided for the same, he will consider it  Counseled on BP and DM control, avoiding NSAIDs and encouraged hydration  Medications reviewed  All questions answered and relevant labwork discussed with pt to apparent satisfaction  Follow up in 4-5 months with pre-visit lab    Renal Lesion  Status Unstable    Plan  I did explain the risk of NSF While highlighting that Per KDIGO CKD guidelines of 2012, No patients with a GFR > 30 ml/min/1.73 m2 have developed NSF without concomitant liver failure. He understands and agrees to go ahead  Management per urology    Hypertension  Status:  Well control    Plan  Goal SBP 120-130s  Continue current anti-HTN meds    Anemia  Status:  Controlled with the last hemoglobin of 10.2 g/dl    Plan  No EPO needs at this time  Follow up next visit    Secondary hyperPTH  Status:  Controlled     Plan  Follow up next visit       I addressed medical care services that are part of ongoing care related to a patient's single, serious condition or a complex condition OR serve as the continuing focal point for all needed health care services.    This dictation was produced using voice recognition software. Despite concurrent proofreading, please note transcription errors may be present and that errors may not reflect intent.   Any portions copied from prior notes have been reviewed, repeated or edited to match the patients current presentation or left intact as applicable.    Electronically signed by:  Regino Bellow,  11/26/2022   4:04 PM

## 2022-11-28 MED ORDER — sodium bicarbonate 650 mg tablet
650 | ORAL_TABLET | Freq: Two times a day (BID) | ORAL | 3 refills | Status: DC
Start: 2022-11-28 — End: 2022-12-05

## 2022-11-28 NOTE — Telephone Encounter (Signed)
Please review lab from 6/4 [facility].  Co2 17  Spoke with patient he is taking Sodium Bicarb 650 mg 2am, 1 pm  Patient has appointment 6/19

## 2022-11-28 NOTE — Addendum Note (Signed)
Addended by: Biagio Quint on: 11/28/2022 04:20 PM     Modules accepted: Orders

## 2022-11-28 NOTE — Telephone Encounter (Signed)
LMOVM for patient with Dr Milta Deiters response and instructions.  New RX is sent

## 2022-11-28 NOTE — Telephone Encounter (Signed)
LMOVM for patient to return call.  Is he still taking Sodium Bicarb 650 2am, 1 pm?

## 2022-11-28 NOTE — Telephone Encounter (Signed)
Noted, can go up to 2 pills twice daily

## 2022-12-05 ENCOUNTER — Ambulatory Visit
Admit: 2022-12-05 | Discharge: 2022-12-05 | Payer: MEDICARE | Attending: Student in an Organized Health Care Education/Training Program

## 2022-12-05 DIAGNOSIS — N1832 Chronic kidney disease, stage 3b (HCC): Secondary | ICD-10-CM

## 2022-12-05 MED ORDER — torsemide (DEMADEX) 10 mg tablet
10 | ORAL_TABLET | Freq: Every day | ORAL | 2 refills | 30.00000 days | Status: DC
Start: 2022-12-05 — End: 2022-12-12

## 2022-12-05 MED ORDER — sodium bicarbonate 650 mg tablet
650 | ORAL_TABLET | Freq: Two times a day (BID) | ORAL | 3 refills | Status: DC
Start: 2022-12-05 — End: 2023-01-03

## 2022-12-05 NOTE — Assessment & Plan Note (Signed)
Increased sodium bicarbonate to 2 pills twice daily, repeat renal panel on Monday

## 2022-12-05 NOTE — Assessment & Plan Note (Signed)
Repeat echo.  I explained that with the rising leg swelling and draining wound, it is imperative that we get leg swelling down and ideally going to the emergency room and getting IV diuresis would be the way to go about this however he would like to avoid this at this time.  As such we will stop Lasix and start torsemide 10 mg.  And see the response.  His last potassium was 4.8 on Lokelma every other day and I do not anticipate a dramatic drop in potassium to necessitate taking potassium supplement just yet.  I will watch his renal panel on Monday as he plans to start taking the torsemide tomorrow and we can adjust the [facility] depending on potassium levels.  In order to get labs in expedited fashion, I have asked him to get labs done where results will be available to [place] on the same date.  I have also advised him to let his cardiologist know.  He denies any overt chest pain or worsening dyspnea or orthopnea at this time though  Follow-up in 1 month primarily to assess diuresis.  However we will call him on Monday to see how his weights and blood pressures are doing and to see if torsemide needs up titration.  Eventual plan would be to come back to Lasix presumably at a higher dose.  He was informed to ensure that he is not taking both medications at the same time

## 2022-12-05 NOTE — Assessment & Plan Note (Signed)
See plan for diuresis, otherwise continue other antihypertensives

## 2022-12-05 NOTE — Assessment & Plan Note (Addendum)
Renal function stable but rising proteinuria.  For now as I am switching him to torsemide and expect his blood pressures to drop, I will hold off on eplerenone initiation and will consider it next week.  Due to the abrasion on the right leg, I will hold off on SGLT2 initiation at this time  His right lower extremity has an abrasion, it does not seem infected to me, I do not feel the need to treat with systemic antibiotics and he is in agreement however I did let him know that should it worsen he should go to the emergency room and he is to call his PCP to address this further  Medications reviewed  Follow-up in 1 month

## 2022-12-05 NOTE — Patient Instructions (Addendum)
Start torsemide daily  Stop the lasix/furosemide while taking the torsemide  Weigh yourself everyday, and write your blood pressure down  Call us on Monday   If you get worsening shortness of breath, leg swelling, weakness, fevers, chills, infection in your leg, please go to the ER

## 2022-12-05 NOTE — Progress Notes (Signed)
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  Salt intake has remained stable    HTN history  Diagnosed 2018  Takes amlodipine 5, olmesartan 40, metop 50bid, lasix 20  Prior BP logs: 140/60s range  Saw Dr Alfred Levins in Nov 2023    DM history  T2DM  Diagnosed 2018  On insulin  No h/o retinopathy    Renal history  No prior h/o UTIs, leg ulcers/PVD  No recent NSAID/PPI use, COVID infection or stone  Remote h/o smoking    Previous labs (adapted from prior providers):  11/2022 creatinine 1.8, GFR 36, potassium 4.8, bicarb 17, UAwith 2+ protein, vitamin D 32, UPCR 2 g, A1c 6, PTH 98  05/2022 Cr 1.73, GFR 40,   04/2022 Cr 1.7, GFR 41, Bic 20, K 5.3, UPCR 1.47  01/17/22  BUN 44 creatinine 1.74 GFR 48 sodium 143 potassium 4.5 bicarbonate 21 glucose 310 albumin 3.2 calcium 8.5 phosphorus 3.6 PTH 145  Hemoglobin 12.5  Microalbumin to creatinine ratio 692  A1c 6.5%  07/07/21 Cr 1.84, GFR 38, K 5.7, Glu 284,  Hb 11.5, UA w neg pro, blood, 0-2W, R, UPCR 0.5  12/08/19 sodium 145 potassium 4.1 chloride 112 carbon dioxide 19 anion gap 14 glucose 149 BUN 35 creatinine 1.68 eGFR 40 cc/min calcium 8.8 phosphorus 4.1 albumin 3.26, UACR 220 ug/mg  03/27/19 Hemoglobin 12.5 Sodium 142 Potassium 4.3 Chloride 109 Carbon dioxide 21 Anion gap 12 Glucose 150 BUN 38 Creatinine 1.58 eGFR 43 cc/min Calcium 9.1 Phosphorus 4.5 Albumin 3.8 UACR 107 ug/mg  08/07/18 BUN 39 creatinine 1.63 eGFR 42 cc/m  04/03/18  BUN 35 creatinine 1.51 eGFR 46 cc/min Urine protein 11 Urine  creatinine 29 UPC 0.4 g/day 25-OH Vitamin D 29.2  11/14/17 BUN 35 creatinine 1.51 eGFR 46 cc/min  04/24/17 creatinine 1.67  12/31/16 creatinine 1.75  11/22/16 creatinine 1.46  11/20/16 creatinine 1.41  11/08/16 creatinine 1.40  08/20/16 creatinine 1.69  12/01/15 creatinine 1.37  11/08/14 creatinine 1.21  04/16/14 creatinine 1.40  11/05/13 creatinine 1.10    Imaging:  Last renal imaging: [place] 2018 R kidney 13.8cm, L kidney 13.8cm, PVR not measured    CT  Left inferior pole lesion has no enhancement and is not suspicious.     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 Consider renal MRI with and without IV contrast and urological consultation.     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 Consider follow-up CT abdomen/pelvis or aortic ultrasound in 12 months to document stability.          Past Medical History:  has a past medical history of COPD (chronic obstructive pulmonary disease) (HCC), Diabetes mellitus, type II (HCC), Hypercholesteremia, Hypertension, and Obesity, morbid (HCC).     Past Surgical History:  has no past surgical history on file.       Outpatient Medications as of 12/05/2022:   .  albuterol, INHALE ONE PUFF BY MOUTH EVERY 4 HOURS AS NEEDED  .  amLODIPine, 5 mg, Oral, Daily  .  ammonium lactate, 12 g, Topical, PRN  .  aspirin, 81 mg.  .  atorvastatin, 40 mg, Oral, Daily  .  custom medication, every night at bedtime. Med Name: cpap  .  cyanocobalamin, once daily.  .  diclofenac, Apply topically daily as needed. Effected area  .  TRELEGY ELLIPTA, 1 puff, Inhalation, Daily  .  furosemide, 20 mg, Oral, Daily  .  gabapentin, 300 mg, Oral, TID  .  glimepiride, 4 mg, Oral, QAM AC  .  HYDROcodone-acetaminophen, 1 tablet, Oral, Daily  .  hydroxychloroquine, 200 mg, Oral, BID  .  leflunomide, 20 mg, Oral, Daily  .  LOKELMA, TAKE 1 POWDER IN PACKET BY MOUTH DAILY  .  metoprolol tartrate, TAKE ONE TABLET BY MOUTH TWICE A DAY  .   olmesartan, 40 mg, Oral, Daily  .  testosterone, Apply 4 Act topically Daily.  [name]  triamcinolone, Apply  topically 2 times daily.  [name]  allopurinoL, 300 mg. (Patient not taking: Reported on 05/21/2022)  .  insulin aspart, 22 units, Subcutaneous, TID with meals (Patient taking differently: 22 units, Subcutaneous, Daily, Per sliding scale)  .  insulin glargine, 25 units, Subcutaneous, Q12H [facility] (Patient taking differently: Subcutaneous, Every 12 hours scheduled, Pt. Is unsure if this is the name of the new pen he got. Pt is doing a sliding scale  )  .  sodium bicarbonate, 1,300 mg, Oral, BID  .  torsemide, 10 mg, Oral, Daily     Allergies: is allergic to lisinopril, sulfa (sulfonamide antibiotics), levofloxacin, and neomycin.     Family History: family history is not on file.     Social History:  reports that he has never smoked. He has never used smokeless tobacco. He reports that he does not drink alcohol and does not use drugs.     Health Maintenance   Topic Date Due   . Annual Wellness Visit  Never done   . Diabetes: Foot Exam  Never done   . Diabetes: Retinopathy Screening  Never done   . RSV Vaccine (1 - 1-dose 60+ series) Never done   . Diabetes: Urine Protein Screening  12/14/2020   . COVID-19 Vaccine (5 - 2023-24 season) 02/16/2022   . Tetanus Vaccine (2 - Td or Tdap) 07/03/2021   . Lipid Panel  04/04/2023   . Diabetes: Hemoglobin A1c  05/02/2023   . Metabolic Panel  10/04/2023   . Influenza Vaccine  Completed   . Zoster Vaccine  Completed   . Hepatitis C Screening  Completed   . Pneumococcal Vaccine: 65+ Years  Completed       REVIEW OF SYSTEMS     Denies dyspnea, light-headedness, nocturia, hematuria, dysuria, foamy urine.  Has chronic LL edema, stable    PHYSICAL EXAM     BP (!) 158/92 (BP Location: Right arm, Patient Position: Sitting)   Pulse 67   Ht 6' 1 (1.854 m)   Wt (!) 436 lb 11.2 oz (198.1 kg)   SpO2 95%   BMI 57.62 kg/m?     GEN: NAD  LUNGS: Clear at BL bases  CV: RRR, S1 S2 heard, no murmur or  rub  EXT: Significant LE Edema, RLE with superficial abrasion on shin, no erythema or warmth  NEURO: grossly normal, no focal findings    ASSESSMENT AND PLAN       1. Stage 3b  chronic kidney disease (HCC)  Overview:  Status: Stable  Baseline Cr: 1.4-1.7  Likely cause: HTN/DM  Imaging: Uptodate  Urine protein: ++  ACE/ARB: Taking  SGLT2 inh: NotTaking    Assessment & Plan:  Renal function stable but rising proteinuria.  For now as I am switching him to torsemide and expect his blood pressures to drop, I will hold off on eplerenone initiation and will consider it next week.  Due to the abrasion on the right leg, I will hold off on SGLT2 initiation at this time  His right lower extremity has an abrasion, it does not seem infected to me, I do not feel the need to treat with systemic antibiotics and he is in agreement however I did let him know that should it worsen he should go to the emergency room and he is to call his PCP to address this further  Medications reviewed  Follow-up in 1 month    Orders:  -     sodium bicarbonate 650 mg tablet; Take 2 tablets by mouth 2 times daily.  Dispense: 360 tablet; Refill: 3  -     Renal Function Panel -Routine; Future; Expected date: 12/10/2022  -     Renal Function Panel -Routine; Future; Expected date: 01/04/2023    2. Heart failure with preserved ejection fraction, unspecified HF chronicity (HCC)  Overview:  Status: Unstable, unknown precipitant    Assessment & Plan:  Repeat echo.  I explained that with the rising leg swelling and draining wound, it is imperative that we get leg swelling down and ideally going to the emergency room and getting IV diuresis would be the way to go about this however he would like to avoid this at this time.  As such we will stop Lasix and start torsemide 10 mg.  And see the response.  His last potassium was 4.8 on Lokelma every other day and I do not anticipate a dramatic drop in potassium to necessitate taking potassium supplement just yet.  I  will watch his renal panel on Monday as he plans to start taking the torsemide tomorrow and we can adjust the [facility] depending on potassium levels.  In order to get labs in expedited fashion, I have asked him to get labs done where results will be available to [place] on the same date.  I have also advised him to let his cardiologist know.  He denies any overt chest pain or worsening dyspnea or orthopnea at this time though  Follow-up in 1 month primarily to assess diuresis.  However we will call him on Monday to see how his weights and blood pressures are doing and to see if torsemide needs up titration.  Eventual plan would be to come back to Lasix presumably at a higher dose.  He was informed to ensure that he is not taking both medications at the same time    Orders:  -     ECHO 2D COMPLETE DOPPLER COLOR FLOW; Future; Expected date: 12/05/2022  -     Renal Function Panel -Routine; Future; Expected date: 12/10/2022    3. Primary hypertension  Overview:  Acceptable control at home    Assessment & Plan:  See plan for diuresis, otherwise continue other antihypertensives      4. Metabolic acidosis  Overview:  Suboptimal control    Assessment & Plan:  Increased sodium bicarbonate to 2 pills twice daily, repeat renal panel on Monday      Other orders  -  torsemide (DEMADEX) 10 mg tablet; Take 1 tablet by mouth once daily.  Dispense: 30 tablet; Refill: 2          I have spent 47 minutes total time including: pre-visit chart review, clinical visit time with the patient discussing issues as stated above and after visit time to document and coordinate care        I addressed medical care services that are part of ongoing care related to a patient's single, serious condition or a complex condition OR serve as the continuing focal point for all needed health care services.    This dictation was produced using voice recognition software. Despite concurrent proofreading, please note transcription errors may be present and that  errors may not reflect intent.   Any portions copied from prior notes have been reviewed, repeated or edited to match the patients current presentation or left intact as applicable.    Electronically signed by:  Regino Bellow,  12/05/2022   10:26 AM

## 2022-12-12 NOTE — Telephone Encounter (Signed)
Patient called he Dc 'd Torsemide due to feeling sick, tired and shaky.  He is going to see his PCP.  He is scheduled for Echo tomorrow but wants to know if he is not still taking the Torsemide if he needs to cancel?

## 2022-12-12 NOTE — Telephone Encounter (Signed)
Spoke with patient. Relayed message below. Faxed lab order to [facility]. CP

## 2022-12-12 NOTE — Telephone Encounter (Signed)
Noted, once he is back to feeling at baseline, he can resume the Lasix at 20 mg and see how edema responds  Can also do a renal panel tomorrow when he goes for the echo

## 2022-12-12 NOTE — Telephone Encounter (Signed)
Tried to call patient no answer. 

## 2022-12-12 NOTE — Telephone Encounter (Signed)
How are his leg swelling, blood pressures and weights.  Agree with holding torsemide, echo would always give [place] good information though if he is not feeling well he should not need to go in for that and he can be rescheduled

## 2022-12-12 NOTE — Addendum Note (Signed)
Addended by: Brynda Greathouse on: 12/12/2022 02:48 PM     Modules accepted: Orders

## 2022-12-12 NOTE — Telephone Encounter (Signed)
Called and spoke with patient, states Swelling did go down some. Torsemide caused him to  feel weak, sleepy,so he stopped it on Monday and feeling much better.     BP: AM Before Medications and Weights     143/64   433   144/63   431.6   133/65   434  141/62   433  146/68   434  148/68   434.8  153/70      ECHO scheduled at [facility] CP tomorrow 6/27  Will be f/u with PCP

## 2022-12-17 NOTE — Progress Notes (Signed)
CHIEF COMPLAINT:     Reviewed and updated this visit by provider:  Tobacco  Allergies  Meds  Problems               Chief Complaint   Patient presents with   . Swelling     Has swelling in leg and feet. He is currently taking Lasix.   Saw his kidney dr Dr. Park Breed, she prescribed Torsemide, it gave him side effects, she told him pt stop taking it. It gave him an upset stomach, depressed, tired.    . DME     He would like a prescription for a wheelchair.         ASSESSMENT & PLAN:    1. Chronic right-sided heart failure (HCC) (Primary)  Assessment & Plan:  Ongoing management per cardiology/nephrology.  In review of recent nephrology note, patient was trialed on torsemide but was unable to tolerate due to side-effects - now back on Lasix.  Weight today seems stable with historic baseline and patient does not appear to be acutely fluid overloaded.  Will prescribe Lasix 40 mg for 2 weeks for increased diuresis and recheck BMP 1 week after initiating therapy.  He remains on 10 g of Lokelma every other day and his potassium levels have been within range upon last several rechecks.  Patient is aware that this new interim Lasix dose will replace his regular 20 mg dose and he is not to use both medicines at once.  Will see patient back in 2 weeks for another office visit prior to his follow-up with nephrology thereafter.  He was encouraged to call and reschedule his ECHO ASAP.    Orders:  -     DME: Wheelchair  -     Basic Metabolic Panel; Future; Expected date: 12/24/2022  -     furosemide (LASIX) 40 mg tablet; Take 1 tablet by mouth Daily for 14 days.  Dispense: 14 tablet; Refill: 0    2. Venous stasis of both lower extremities  Assessment & Plan:  No ulcers, open lesions or signs of weeping. Prior site of injury on anterior right shin does not show any signs of secondary infection.  Advised ongoing monitoring for ulcers, elevation of feet, compression stockings and use of emollients/triamcinolone cream for stasis  dermatitis symptoms.    Orders:  -     DME: Wheelchair  -     Basic Metabolic Panel; Future; Expected date: 12/24/2022  -     furosemide (LASIX) 40 mg tablet; Take 1 tablet by mouth Daily for 14 days.  Dispense: 14 tablet; Refill: 0    3. Stage 3b chronic kidney disease [facility])  Assessment & Plan:  Managed by nephrology ongoing.  Expected to initiate SGLT2 as well as eplerenone at upcoming appointment later this month.    Orders:  -     DME: Wheelchair  -     Basic Metabolic Panel; Future; Expected date: 12/24/2022  -     furosemide (LASIX) 40 mg tablet; Take 1 tablet by mouth Daily for 14 days.  Dispense: 14 tablet; Refill: 0    4. Diabetic polyneuropathy associated with type 2 diabetes mellitus (HCC)  Assessment & Plan:  Symptoms remain tolerable and at patient's baseline.  Continue gabapentin as directed.      5. Hypertensive chronic kidney disease with stage 1 through stage 4 chronic kidney disease, or unspecified chronic kidney disease  Assessment & Plan:  Elevated today upon first measurement but improved upon recheck.  Patient continues  monitoring home blood pressures per his specialists and was advised that interim diuresis may cause his pressures to drop.      6. Osteoarthritis of both knees, unspecified osteoarthritis type  Assessment & Plan:  Well-documented history of bilateral knee OA.  Will order wheelchair on the basis of his orthopedic concerns in conjunction with his cardiopulmonary limitations.    Orders:  -     DME: Wheelchair    7. Rheumatoid arthritis involving multiple sites, unspecified whether rheumatoid factor present (HCC)  -     DME: Wheelchair      HPI:    Evan [name] is a 77 y.o. male with history of CKD, HTN, DM, HFpEF, CAD, OSA on CPAP, COPD, and RA, who presents today with complaints of increased swelling in his legs and feet. He is followed by both cardiology and nephrology (Dr. Park Breed) and was recently trialed on Torsemide which he could not tolerate as this made him feel weak and sleepy.  He has since switched back to Lasix. He did get some improvement with regards to his swelling on the Torsemide. He was scheduled for an ECHO a few days ago but went to the wrong location and will need to reschedule. He states that he last had labs done on Thursday 6/28; however I see no evidence of this in the system.       He states that he suffered a ground level fall in the garage when climbing the steps a few weeks ago. He states that he suffered a small abrasion to the right shin during this event. The area had been weeping serosanguinous fluid but stopped about a week ago. He has been keeping the area open to the air which seems to be improving things. He elevates his feet every evening but does not wear compression stockings as he cannot tolerate their use with his chronic neuropathy symptoms. He states that he has been drinking around 60oz of water daily, weighing himself and taking his blood pressures at the direction of his nephrologist.     He would like a prescription for a wheelchair. He has difficulty walking on the basis of osteoarthritis in both knees and heart failure. He states that he cannot walk more than 50 feet without feeling profoundly short of breath and needing to sit down. He would like a wheelchair for mobility in and out of the home. He denies any shortness of breath deviating from his baseline. He denies any chest pain or other constitutional symptoms.      PHQ-2 <redacted file path> Depression screening Positive (12/17/22 1039)   Synopsis <redacted file path>  1. Little interest/pleasure in doing things? : More than half the days (12/17/22 1039)  2. Feeling down, depressed/hopeless?: More than half the days (12/17/22 1039)      PHQ-9 <redacted file path> Depression scale  Total score: 7 (12/17/22 1039)  Printable questionnaires in [place], [place], others  Synopsis <redacted file path>    Score Interpretation   1-4 Minimal depression   5-9 Mild depression   10-14 Moderate depression    15-19 Moderate-severe depression   20-27 Severe depression     1. Little interest or pleasure in doing things?: More than half the days (12/17/22 1039)  2. Feeling down, depressed, or hopeless?: More than half the days (12/17/22 1039)  3. Trouble falling or staying asleep, or sleeping too much?: Not at all (12/17/22 1039)  4. Feeling tired or having little energy?: More than half the days (12/17/22 1039)  5.  Poor appetite or overeating?: Not at all (12/17/22 1039)  6. Feeling bad about yourself - or that you are a failure or have let yourself or your family down?: Several days (12/17/22 1039)  7. Trouble concentrating on things, such as reading the newspaper or watching television?: Not at all (12/17/22 1039)  8. Moving or speaking so slowly that other people could have noticed. Or the opposite - being so fidgety or restless that you have been moving around a lot more than usual?: Not at all (12/17/22 1039)  9. Thoughts that you would be better off dead, or of hurting yourself in some way?: Not at all (12/17/22 1039)  If you checked off any problems, how difficult have these problems made it for you to do your work, take care of things at home, or get along with other people?: Very difficult (12/17/22 1039)          Office Visit on 10/30/2022   Component Date Value Ref Range Status   . Hemoglobin A1C, POC 10/30/2022 6.2 (A)  0.0 - 5.6 % Final     PHYSICAL EXAM:    VITAL SIGNS: BP 120/66   Pulse 63   Temp 36.7 ?C (98 ?F) (Oral)   Resp 20   Ht 1.854 m (6' 1)   Wt (!) 195 kg (430 lb)   SpO2 92%   BMI 56.73 kg/m?     Physical Exam  Constitutional:       General: He is not in acute distress.     Appearance: Normal appearance. He is obese. He is not ill-appearing, toxic-appearing or diaphoretic.      Comments: Using 4-wheeled walker   HENT:      Head: Normocephalic and atraumatic.   Cardiovascular:      Rate and Rhythm: Normal rate and regular rhythm.      Pulses: Normal pulses.      Heart sounds: Normal heart  sounds.   Pulmonary:      Effort: Pulmonary effort is normal. No respiratory distress.      Breath sounds: Decreased breath sounds present. No rales.   Musculoskeletal:      Right lower leg: Edema (to mid calf) present.      Left lower leg: Edema (to mid calf) present.   Skin:     General: Skin is warm and dry.      Capillary Refill: Capillary refill takes less than 2 seconds.      Comments: Stasis dermatitis changes of lower extremities   Neurological:      Mental Status: He is alert and oriented to person, place, and time.        Evan [name]  has a mobility limitation that significantly impairs their ability to participate in one or more mobility-related activities of daily living (MRADLs) such as toileting, feeding, dressing, grooming, and bathing in customary locations in the home. A mobility limitation is one that places the patient at reasonably determined heightened risk of morbidity or mortality secondary to the attempts to perform an MRADL.  Evan [name]'s mobility limitation cannot be sufficiently resolved by the use of an appropriately fitted cane or walker. Avion's home provides adequate access between rooms, maneuvering space, and surfaces for use of the manual wheelchair that is provided. Use of a manual wheelchair will significantly improve the Aroldo's ability to participate in MRADLs and the patient will use it on a regular basis in the home. Deegan is able to self propel and has not expressed an unwillingness to  use the manual wheelchair that is provided in the home. DME Wheelchair orders have been placed for delivery.    Thank you, Tamala Ser. Bari Mantis - DME liaison    Tamala Ser. Sherer, PA-C    I spent a total of 39 minutes in chart review, face-to-face time, post-visit documentation and other services detailed on the day of this encounter.     This note was dictated using voice recognition software and reviewed by me in its entirety. Please contact me if there are any questions or concerns  regarding its content.

## 2022-12-27 NOTE — Telephone Encounter (Signed)
TC spoke with patient advised of Dr Milta Deiters response.   He states that his swelling seems better. He can really notice in his hands and his shoes fit better too

## 2022-12-27 NOTE — Telephone Encounter (Signed)
-----   Message from [facility], MD sent at 12/26/2022 12:25 PM PDT -----  Labs reviewed from 12/13/2022 creatinine 1.8, GFR 37, sodium 145,, dioxide 20, BUN 51  How is his leg swelling now

## 2022-12-29 ENCOUNTER — Other Ambulatory Visit: Payer: Self-pay | Admitting: Nurse Practitioner

## 2022-12-31 NOTE — Telephone Encounter (Signed)
Dr.Sherer from [facility] called in and wanted to let us know that he prescribed a 2 week supply of 40mg  of Lasix to the patient and he has 2 days left. He wants to know if we want to continue the lasix or stop it. Dr.Sherer said he doesn't need a call back. Also if we want to continue the lasix he would like [place] to prescribe it.

## 2022-12-31 NOTE — Progress Notes (Signed)
HCA  During annual visit's.  2.   If there is no HCA mentioned in the left-sided patient banner.    Outcome: HCA does not need updated.    Advance Directive   If it shows the patient has an AD on file in the left sided patient banner, verify that there is one scanned under 'media' tab.   If it's a declination of the AD you must address this.  If it is an AD then you do not need to address this.  2.   If showing due in the scorecard.    Outcome: Patient has AD form at home; they were reminded to bring in.    Future AWV:  The patient declined scheduling for their AWV.    AWV Summary:    HRA Incomplete Advance care planning  Docs NOT on file            Cognitive screening Incomplete  Substances Tobacco Reviewed   EtOH Reviewed   Drugs Reviewed    Fall risk Incomplete STEADI  Incomplete TUG       Depression screen PHQ-2 Not done   [facility] diagnoses  None pending       Reviewed during this visit (check all boxes):  [x]   Care gaps  [x]   Score card (includes ADV DIR, HCC, PHQ9, and FALL RISK)  [x]   SDOH (done this year, all 4 questions answered)  [x]   SBIRT (done this year, AUDIT/DAST completed if needed)  [x]   Screening codes dropped in charge capture (AUDIT, DAST, PHQ9, GAD7)    Care Gaps showing due:  Health Maintenance Due   Topic Date Due   . Medication Management  Never done        Screenings completed via Mychart:  No questionnaires on file.     Screenings completed during visit:

## 2022-12-31 NOTE — Progress Notes (Signed)
CHIEF COMPLAINT:     Reviewed and updated this visit by provider:  Tobacco  Allergies  Meds  Problems               Chief Complaint   Patient presents with   . Follow-up   . Edema     Assessment & Plan  Type 2 diabetes mellitus with stage 3b chronic kidney disease, with long-term current use of insulin (HCC)  Remains well controlled with recent A1c from 12/24/22 which has improved to 6.3%. No changes indicated; continue present treatment.    Stage 3b chronic kidney disease [facility])  Ongoing care per nephrology (Dr. Welton Flakes) with upcoming appointment scheduled on 01/08/2023.  I personally contacted Dr. Milta Deiters office today to discuss treatment recommendations including the continuation of Lasix 40 mg as the new daily maintenance dose as well as the discontinuation of Lokelma.  Dr. Milta Deiters office will contact patient in the coming days regarding future management recommendations and have faxed lab orders to [facility] to be drawn today.  Recent lab updates from nephrology on 12/13/2022 and here from 12/24/2022 are consistent with historic baseline and reveal progressive but stable decline in renal function.  Chronic right-sided heart failure (HCC)  Symptomatically stable with consistent weights per home logs with baseline lower extremity edema which has improved from prior.  Ongoing management per cardiology (Dr. Alfred Levins).   Venous stasis of both lower extremities  Chronic venous stasis changes again noted. Patient encouraged encouraged to continue with home emollient, triamcinolone and ammonium lactate use.     HPI:    Evan [name] is a 77 y.o. male who presents today to follow-up on lower extremity swelling. He has two doses from his two week course of 40mg  Lasix left. He states that his lower extremity swelling has improved significantly. He is able to evidence being less swollen by looking at his wrists which he states that he is able to see the creases in which he normally can't. He has a log of home blood pressures and weights for  review today. Home weights have been stable around 430lbs with blodd pressures averaging in the mid 140's/60's prior to any medication dosing. He has noticed increased urination since starting the 40mg  Lasix dose but has not had any lightheadedness or dizziness. He has an appointment scheduled with nephrology on 7/23. He spoke with his nephrology office a few days ago and believes that he was informed to discontinue his Lokelma and keep on a 40mg  dose of Lasix but he isn't definitively sure. Labs per nephrology from 12/13/2022 revealed a creatinine 1.8, GFR 37, sodium 145, CO2 20, BUN 51 and were consistent with labs drawn here from 12/24/2022 revealing creatinine 1.9, GFR 34, sodium 142, potassium 5, CO2 18, BUN 54. Patient needs his DME order for a wheelchair to be sent to [facility]. He has no other concerns at this time.     PHQ-2 <redacted file path> Depression screening Positive (12/31/22 1047)   Synopsis <redacted file path>  1. Little interest/pleasure in doing things? : Nearly every day (12/31/22 1047)  2. Feeling down, depressed/hopeless?: More than half the days (12/31/22 1047)      PHQ-9 <redacted file path> Depression scale  Total score: 10 (12/31/22 1047)  Printable questionnaires in [place], [place], others  Synopsis <redacted file path>    Score Interpretation   1-4 Minimal depression   5-9 Mild depression   10-14 Moderate depression   15-19 Moderate-severe depression   20-27 Severe depression     1. Little  interest or pleasure in doing things?: Nearly every day (12/31/22 1047)  2. Feeling down, depressed, or hopeless?: More than half the days (12/31/22 1047)  3. Trouble falling or staying asleep, or sleeping too much?: More than half the days (12/31/22 1047)  4. Feeling tired or having little energy?: Nearly every day (12/31/22 1047)  5. Poor appetite or overeating?: Not at all (12/31/22 1047)  6. Feeling bad about yourself - or that you are a failure or have let yourself or your family down?: Not at  all (12/31/22 1047)  7. Trouble concentrating on things, such as reading the newspaper or watching television?: Not at all (12/31/22 1047)  8. Moving or speaking so slowly that other people could have noticed. Or the opposite - being so fidgety or restless that you have been moving around a lot more than usual?: Not at all (12/31/22 1047)  9. Thoughts that you would be better off dead, or of hurting yourself in some way?: Not at all (12/31/22 1047)  If you checked off any problems, how difficult have these problems made it for you to do your work, take care of things at home, or get along with other people?: Somewhat difficult (12/31/22 1047)          Sodium   Date Value Ref Range Status   12/24/2022 142 134 - 144 mmol/L Final     Potassium   Date Value Ref Range Status   12/24/2022 5.0 3.5 - 5.2 mmol/L Final     Chloride   Date Value Ref Range Status   12/24/2022 111 99 - 111 mmol/L Final     Carbon dioxide   Date Value Ref Range Status   12/24/2022 18 (L) 22 - 32 mmol/L Final     Anion Gap   Date Value Ref Range Status   10/04/2022 7 2 - 13 mmol/L Final     Calcium   Date Value Ref Range Status   12/24/2022 8.9 8.6 - 10.2 mg/dL Final     Comment:     Performed At: [address]  [facility]  [address] [place], [place]  [id number]  Darrelyn Hillock MD  [phone]       BUN   Date Value Ref Range Status   12/24/2022 54 (H) 2 - 23 mg/dL Final     Creatinine   Date Value Ref Range Status   12/24/2022 1.98 (H) 0.60 - 1.30 mg/dL Final     Glucose   Date Value Ref Range Status   10/04/2022 120 (H) 65 - 99 mg/dL Final     Comment:     Diagnostic Categories (per ADA):    Fasting:  < 100 mg/dL = Normal  725 to 366 mg/dL = IFG (Impaired Fasting Glucose)  >/= 126 mg/dL = Diagnosis of Diabetes.*    2 Hr OGTT:  < 140 mg/dL = Normal   440 to 347 mg/dL = IGT (Impaired Glucose Tolerance)  >/= 200 mg/dL = Diagnosis of Diabetes.*    * In the absence of unequivocal hyperglycemia, results should be confirmed by repeat  testing.    In Pediatric patients, type 1 diabetes is more common and may warrant more immediate attention.     eGFR   Date Value Ref Range Status   12/24/2022 34 (L) >59 mL/min/1.73 Final       Appointment on 12/25/2022   Component Date Value Ref Range Status   . Hemoglobin A1c 12/24/2022 6.3 (H)  4.8 -  5.6 % Final    Comment:          Prediabetes: 5.7 - 6.4           Diabetes: >6.4           Glycemic control for adults with diabetes: <7.0     . Estimated Average Glucose 12/24/2022 134  mg/dL Final    Comment: Performed At: 01  [facility]  [address] [place], [place]  [phone]  Darrelyn Hillock MD  [phone]       PHYSICAL EXAM:    VITAL SIGNS: BP 138/68   Pulse 64   Temp 36.7 ?C (98.1 ?F) (Oral)   Resp 16   Ht 1.854 m (6' 1)   Wt (!) 202 kg (445 lb 4.8 oz)   SpO2 95%   BMI 58.75 kg/m?     Physical Exam  Vitals reviewed.   Constitutional:       General: He is not in acute distress.     Appearance: Normal appearance. He is obese. He is not ill-appearing, toxic-appearing or diaphoretic.   HENT:      Head: Normocephalic and atraumatic.   Cardiovascular:      Rate and Rhythm: Normal rate and regular rhythm.      Pulses: Normal pulses.      Heart sounds: Normal heart sounds.   Pulmonary:      Effort: Pulmonary effort is normal.      Breath sounds: Decreased breath sounds present.   Musculoskeletal:      Right lower leg: Edema (1+ to ankle) present.      Left lower leg: Edema (1+ to ankle) present.   Skin:     General: Skin is warm and dry.      Comments: Chronic stasis dermatitis changes   Neurological:      Mental Status: He is oriented to person, place, and time.   Psychiatric:         Mood and Affect: Mood normal.         Behavior: Behavior normal.         Judgment: Judgment normal.        Sage G. Sherer, PA-C    I spent a total of 30 minutes in chart review, face-to-face time, post-visit documentation and other services detailed on the day of this encounter.     This note was dictated  using voice recognition software and reviewed by me in its entirety. Please contact me if there are any questions or concerns regarding its content.

## 2022-12-31 NOTE — Progress Notes (Signed)
Evan [name] [birth year]    Home Phone [phone]   Mobile [phone]       Appt Date: 12/31/2022 Time: 10:45 AM  Provider: Butch Penny, PA-C  Appt Type: Problem Based  Chief Complaint: follow up, leg and foot swelling    Is patient due for AWV: YES Convert Visit to AWV: NO  Date of last SBIRT: 2024 Complete SBIRT at OV: NO   Date of last SDOH: 2024 Complete SDOH at OV: NO    If patient is here for an annual:  - T up refills of all chronic medications for quantity of #90 and 4 refills  - Ensure annual labs were completed already. If not, T up annual labs per standardized AWV lab list and notify patient to complete   If patient has HCC/care gaps and has not been seen in last 3 months, schedule appointment.   Health Maintenance Due   Topic Date Due   . Medication Management  Never done     BP Readings from Last 3 Encounters:   12/17/22 120/66   10/30/22 128/68   10/04/22 130/55     Last Office Visit              12/17/2022 Chronic right-sided heart failure [facility])    [facility] [place] [facility] Butch Penny, [place]    Office Visit    10/30/2022 Type 2 diabetes mellitus with other diabetic kidney complication [facility])    [facility] [place], [place]    Office Visit    07/27/2022 Type 2 diabetes mellitus with mild nonproliferative retinopathy without macular edema, with long-term current use of insulin, unspecified laterality [facility])    [facility] [place], [place]    Office Visit          Date of Next Encounter: 01/29/2023      Follow up reason: Return:                   Items needed at checkout: []  1 week      []  2 weeks       []  3 weeks    []  1 month      []  3 months    []  6 months     []  9 months   []  1 year    []  As needed        I, Designer, jewellery, Medical Assistant attest that I reviewed the provider's last assessment and plan, health maintenance care gaps and orders, and patient scorecard care metric gaps with Evan [name] prior to their  upcoming appointment accurately and to the best of my knowledge.This chart scrub was performed by Aloha Gell, Medical Assistant, 12/31/2022 at 9:04 AM PDT for upcoming appointment with patient's Care Team.

## 2023-01-01 NOTE — Progress Notes (Signed)
Name: Evan [name]  DOB: March 12, 1946  Phone: [phone] (home)  Account number: [id number]  DOS:  01/01/2023    Primary Care Provider: Lovenia Shuck. Holste, PA    Subjective  [name] is here today for a cortisone injection in left knee. The patient was previously evaluated for knee pain and symptoms are consistent with degenerative joint disease.    Injection  Under sterile conditions 40 mg of Kenalog and 2 mL of 1% Lidocaine without epinephrine were injected into the left knee.  Patient tolerated the procedure well.  No apparent complications.      Electronically signed by:      Samuella Bruin, PA-C    Patient was seen in conjunction with Dr Anselm Jungling today in the office.

## 2023-01-02 NOTE — Telephone Encounter (Signed)
Please see 7/15 note from Barney Drain, [facility] says talked to you about Lasix    He has appt with Amy 7/23

## 2023-01-02 NOTE — Telephone Encounter (Signed)
LMOVM for patient to return call.

## 2023-01-02 NOTE — Telephone Encounter (Signed)
-----   Message from Jolayne Haines sent at 01/02/2023  9:13 AM PDT -----  Regarding: LABS 07/15  PLEASE REVIEW LABS FROM   12/31/22

## 2023-01-02 NOTE — Telephone Encounter (Signed)
Labs reviewed from 12/31/2022 urine with 2+ protein, creatinine 1.87, GFR 36, sodium 145, potassium 4.8, UPCR 1.2 g, PTH 102, vitamin D 27.4    From this lab work, he can continue on Lasix, decrease sodium bicarbonate to 1 pill a day.  If he is not taking Lokelma, he can stay off of it his potassium looks better  Should take vitamin D 1000 unit/day

## 2023-01-02 NOTE — Progress Notes (Deleted)
HPI: FU syncope. Previously followed by Dr. Danley Danker with [facility]. Stress echocardiogram November 2022 showed normal LV function and ischemia in the inferior/inferolateral walls by report.  Cardiac catheterization December 2022 showed 30% mid LAD and otherwise minor irregularities.  CTA March 2023 showed 50% or less stenosis in the right and left proximal internal carotid arteries.  Carotid Dopplers May 2023 technically difficult; there was suggestion of significant stenosis in the left internal carotid artery but report states "I would rely more on findings from recent CTA". Echocardiogram June 2023 showed normal LV function, grade 1 diastolic dysfunction, trace aortic insufficiency.  Monitor July 2023 showed sinus rhythm with PACs, brief PAT, PVCs, 4 and 6 beat runs of nonsustained ventricular tachycardia. Patient has had difficulties with orthostatic hypotension and syncope. Since last seen,   Current Outpatient Medications  Medication Sig Dispense Refill   amLODipine (NORVASC) 2.5 MG tablet TAKE 1 TABLET BY MOUTH DAILY 90 tablet 0   amLODipine (NORVASC) 5 MG tablet Take 1 tablet (5 mg total) by mouth daily. 90 tablet 1   ascorbic acid (VITAMIN C) 250 MG tablet Take by mouth.     aspirin EC 81 MG tablet Take 81 mg by mouth daily. Swallow whole.     atorvastatin (LIPITOR) 80 MG tablet Take 80 mg by mouth daily.     Coenzyme Q10 50 MG CAPS Take by mouth.     Cyanocobalamin (VITAMIN B 12 PO) Take by mouth.     Magnesium 250 MG TABS Take by mouth.     Omega-3 Fatty Acids (FISH OIL) 1000 MG CAPS Take by mouth.     omeprazole (PRILOSEC) 20 MG capsule Take 20 mg by mouth daily.     sulfamethoxazole-trimethoprim (BACTRIM DS) 800-160 MG tablet Take 1 tablet by mouth 2 (two) times daily.     valsartan (DIOVAN) 160 MG tablet Take 1 tablet (160 mg total) by mouth daily. 90 tablet 3   No current facility-administered medications for this visit.     Past Medical History:  Diagnosis Date    CAD (coronary artery disease)    COPD (chronic obstructive pulmonary disease) (HCC) 09/07/2021   GERD (gastroesophageal reflux disease)    Heart murmur    Hiatal hernia 07/16/2021   Hyperlipidemia    Hypertension     Past Surgical History:  Procedure Laterality Date   cardiac catherization     CHOLECYSTECTOMY     HERNIA REPAIR      Social History   Socioeconomic History   Marital status: Divorced    Spouse name: Not on file   Number of children: 0   Years of education: Not on file   Highest education level: Not on file  Occupational History   Occupation: Retired  Tobacco Use   Smoking status: Former    Current packs/day: 0.00    Average packs/day: 0.3 packs/day for 15.0 years (3.8 ttl pk-yrs)    Types: Cigarettes, Pipe    Start date: 07/07/1984    Quit date: 07/08/1999    Years since quitting: 23.5    Passive exposure: Past   Smokeless tobacco: Never   Tobacco comments:    None  Vaping Use   Vaping status: Never Used  Substance and Sexual Activity   Alcohol use: Yes    Alcohol/week: 5.0 standard drinks of alcohol    Types: 5 Shots of liquor per week    Comment: Occasional   Drug use: Never   Sexual activity: Not Currently  Other Topics  Concern   Not on file  Social History Narrative   Not on file   Social Determinants of Health   Financial Resource Strain: Low Risk  (12/18/2022)   Received from [facility]   Overall Financial Resource Strain (CARDIA)    Difficulty of Paying Living Expenses: Not hard at all  Food Insecurity: No Food Insecurity (12/18/2022)   Received from [facility]   Hunger Vital Sign    Worried About Running Out of Food in the Last Year: Never true    Ran Out of Food in the Last Year: Never true  Transportation Needs: No Transportation Needs (12/18/2022)   Received from [facility] - Transportation    Lack of Transportation (Medical): No    Lack of Transportation (Non-Medical): No  Physical Activity: Insufficiently Active  (12/18/2022)   Received from [facility]   Exercise Vital Sign    Days of Exercise per Week: 2 days    Minutes of Exercise per Session: 30 min  Stress: No Stress Concern Present (12/18/2022)   Received from [facility] - Occupational Stress Questionnaire    Feeling of Stress : Not at all  Social Connections: Moderately Integrated (12/18/2022)   Received from [facility]   Social Network    How would you rate your social network (family, work, friends)?: Adequate participation with social networks  Intimate Partner Violence: Not At Risk (12/18/2022)   Received from [facility]   HITS    Over the last 12 months how often did your partner physically hurt you?: 1    Over the last 12 months how often did your partner insult you or talk down to you?: 1    Over the last 12 months how often did your partner threaten you with physical harm?: 1    Over the last 12 months how often did your partner scream or curse at you?: 1    Family History  Problem Relation Age of Onset   Stroke Mother    Heart attack Father    Early death Brother     ROS: no fevers or chills, productive cough, hemoptysis, dysphasia, odynophagia, melena, hematochezia, dysuria, hematuria, rash, seizure activity, orthopnea, PND, pedal edema, claudication. Remaining systems are negative.  Physical Exam: Well-developed well-nourished in no acute distress.  Skin is warm and dry.  HEENT is normal.  Neck is supple.  Chest is clear to auscultation with normal expansion.  Cardiovascular exam is regular rate and rhythm.  Abdominal exam nontender or distended. No masses palpated. Extremities show no edema. neuro grossly intact  ECG- personally reviewed  A/P  1 history of syncope-this was previously felt to be likely orthostatic mediated and HCTZ was discontinued as was metoprolol as she had some degree of bradycardia.  LV function was normal.  No recurrences since last office  visit.  2 hypertension-patient's blood pressure is controlled.  Continue present medical regimen.  3 hyperlipidemia-continue statin.  4 carotid artery disease-less than 50% bilaterally on previous CTA.  Continue statin.  5 minimal coronary disease-noted at previous catheterization.  Continue statin.  Patient denies chest pain.  Olga Millers, MD

## 2023-01-03 MED ORDER — furosemide (LASIX) 40 mg tablet
40 | ORAL_TABLET | Freq: Every day | ORAL | 3 refills | Status: DC
Start: 2023-01-03 — End: 2023-01-08

## 2023-01-03 MED ORDER — sodium bicarbonate 650 mg tablet
650 | ORAL | 0.00 refills | Status: DC
Start: 2023-01-03 — End: 2023-02-14

## 2023-01-03 MED ORDER — cholecalciferol (VITAMIN D3) 25 mcg (1,000 unit) tablet
25 | Freq: Every day | ORAL | 1.00 refills | 43.00000 days | Status: AC
Start: 2023-01-03 — End: 2024-03-11

## 2023-01-03 NOTE — Telephone Encounter (Signed)
Spoke with patient he is advised of Dr Milta Deiters response and instructions.  He will start Vit D 1000 daily   He will continue Lasix 40 mg daily.  New RX sent.  He will decrease Sodium Bicarb to 2 am, 1 pm. He has not been taking Lokelma and will stay off it.  He will keep appointment with Amy Tuesday

## 2023-01-03 NOTE — Addendum Note (Signed)
Addended by: Biagio Quint on: 01/03/2023 09:01 AM     Modules accepted: Orders

## 2023-01-08 ENCOUNTER — Ambulatory Visit: Admit: 2023-01-08 | Payer: MEDICARE | Attending: NP

## 2023-01-08 DIAGNOSIS — N1832 Chronic kidney disease, stage 3b (HCC): Secondary | ICD-10-CM

## 2023-01-08 MED ORDER — furosemide (LASIX) 20 mg tablet
20 | ORAL_TABLET | Freq: Every day | ORAL | 4 refills | Status: AC
Start: 2023-01-08 — End: 2024-03-11

## 2023-01-08 MED ORDER — dapagliflozin propanediol (FARXIGA) 10 mg tablet
10 | ORAL_TABLET | Freq: Every day | ORAL | 12 refills | 30.00000 days | Status: AC
Start: 2023-01-08 — End: 2023-02-07

## 2023-01-08 NOTE — Assessment & Plan Note (Addendum)
Managed by urology 

## 2023-01-08 NOTE — Assessment & Plan Note (Addendum)
Home readings: Above target 140s/60s in the morning hours and systolic improved to 120s in the afternoon following medication having been taken  Office readings: slightly above target  No episodes of hypotension   Status: uncontrolled  Significant lymphedema bilaterally of legs, very minimal pitting edema bilaterally  Weight is overall up over the past month on current furosemide 40 mg daily.  Patient associates with clothing/shoes, as home weight is consistent   Recommend restricting intake of Na to less than 2 gm/24 hours.  Recommend a cumulative duration of moderate intensity physical activity at least 150 minutes/week or to a level compatible with physical and cardiovascular tolerance.  Recommend restricting alcohol intake.  Therapy: Start Farxiga 10 mg daily. Decrease furosemide to 20 mg and weight-based additional 20 mg.   Report if systolic trend below 110 or above 160.

## 2023-01-08 NOTE — Assessment & Plan Note (Addendum)
Potassium is in range 4.8.  Continue to hold [facility] for now.  Evan [name] was started, which associated we lowered furosemide to 20 mg daily and as-needed 20 mg daily.  Bicarbonate is in range on supplementation.

## 2023-01-08 NOTE — Progress Notes (Signed)
[facility]  Follow-up Note    Evan [name]  [birth year]    HISTORY OF PRESENT ILLNESS:     Evan [name] was last seen in our office 12/05/2022 with Dr. Welton Flakes.    He has a past medical history significant for CKD, HTN, DM, HFpEF, CAD, OSA on CPAP, COPD, RA and presents for CKD follow up.    In the interim of prior visit torsemide was transitioned back to furosemide.  He associated torsemide with gastrointestinal upset, which resolved with having stopped the medication. Lokelma was stopped associated with stable potassium and increased diuretic dosing, as well sodium bicarbonate down titration to now 3 tablets from 4 daily in the past month.  Various dosing regimens of furosemide, including an interval of escalation to 40 mg for 2 weeks ordered 12/17/2022, which he clarifies he continues up from prior 20 mg dosing. Echocardiogram ordered 12/05/2022 has not been completed, as Evan [name] was with the understanding of having seen Dr. Alfred Levins in November with a good report that he did not need to get the echo after all.  12/20/2020 echocardiogram showed LVEF 60 to 65% with mildly enlarged right ventricle and mildly decreased right ventricular systolic function with borderline enlarged left ventricle.      He is currently taking furosemide 40 mg daily with daily morning weight trending 429-430 lb, with a periodic fleeting fluctuation of 5 lb up or down.  He keeps an eye on his hands, which with edema he finds enlargement and none now.  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  No limiting shortness of breath, does endorse exertional dyspnea not worse from baseline.  He is careful to follow diet control.  Blood pressure is checked in the morning prior to medication, and consistently systolic 140s and diastolic 60s.  He does check an afternoon reading to capture medication sometimes, and most recently afternoon on July 12 was 129/60.  No lightheadedness, no dizziness, no  chest pain.      No evidence nor history of diabetic wound.  He completed antibiotic treatment for a leg scrape and the area has healed without issue.  He ambulates with walker with chronic knee pain with recent knee injections which are helpful.      No recent gross hematuria.  No history of genitourinary or urinary tract infections.      Past Medical History:   Diagnosis Date   . COPD (chronic obstructive pulmonary disease) (HCC)    . Diabetes mellitus, type II (HCC)    . Hypercholesteremia    . Hypertension    . Obesity, morbid (HCC)        No past surgical history on file.    Current Outpatient Medications on File Prior to Visit   Medication Sig Dispense Refill   . albuterol (PROAIR HFA) 90 mcg/actuation inhaler INHALE ONE PUFF BY MOUTH EVERY 4 HOURS AS NEEDED     . amLODIPine (NORVASC) 2.5 MG tablet Take 5 mg by mouth once daily.     [name] ammonium lactate (LAC-HYDRIN) 12 % lotion Apply 12 g topically as needed for Dry skin.     [name] aspirin 81 MG EC tablet 81 mg.     . atorvastatin (LIPITOR) 40 mg tablet Take 40 mg by mouth once daily.     . cholecalciferol (VITAMIN D3) 25 mcg (1,000 unit) tablet Take 1 tablet by mouth once daily.     . custom medication every night at bedtime. Med Name: cpap     .  cyanocobalamin (VITAMIN B-12) 1000 MCG tablet once daily.     . diclofenac (VOLTAREN ARTHRITIS PAIN) 1 % topical gel Apply topically daily as needed. Effected area     . fluticasone-umeclidinium-vilanterol (TRELEGY ELLIPTA) 200-62.5-25 mcg inhaler Inhale 1 puff into the lungs once daily.     [name] gabapentin (NEURONTIN) 300 mg capsule Take 300 mg by mouth 3 times daily. 1am, 1 noon, 2 night     . insulin aspart (NOVOLOG) 100 unit/mL PEN Inject 22 Units under the skin 3 times daily with meals. (Patient taking differently: Inject 22 units under the skin once daily. Per sliding scale) 1 pen 0   . insulin glargine (LANTUS) 100 unit/mL injection Inject 25 Units under the skin every 12 hours. (Patient taking differently: Inject  under the skin every 12 hours. Pt. Is unsure if this is the name of the new pen he got. Pt is doing a sliding scale) 1 vial S+365   . leflunomide (ARAVA) 20 mg tablet Take 20 mg by mouth once daily.     [name] LOKELMA 10 gram oral powder packet TAKE 1 POWDER IN PACKET BY MOUTH DAILY 30 packet 11   . metoprolol tartrate (LOPRESSOR) 50 MG tablet TAKE ONE TABLET BY MOUTH TWICE A DAY     . olmesartan (BENICAR) 40 mg tablet Take 40 mg by mouth once daily. Unknown dosage     . predniSONE (DELTASONE) 10 mg tablet Take 10 mg by mouth 2 times daily as needed. For Rheumatoid arthritis flares     . sodium bicarbonate 650 mg tablet 2 tablets am, 1 tablet pm     . testosterone (ANDROGEL) 12.5 mg/ 1.25 gram (1 %) GlPm Apply 4 Act topically Daily.     [name] triamcinolone (KENALOG) 0.1 % cream Apply  topically 2 times daily.     . [DISCONTINUED] furosemide (LASIX) 40 mg tablet Take 1 tablet by mouth once daily. 90 tablet 3   . allopurinol (ZYLOPRIM) 300 MG tablet 300 mg. (Patient not taking: Reported on 05/21/2022)     . glimepiride (AMARYL) 4 MG tablet Take 4 mg by mouth every morning before breakfast. (Patient not taking: Reported on 01/08/2023)     . HYDROcodone-acetaminophen (NORCO) 10-325 mg tablet Take 1 tablet by mouth once daily. (Patient not taking: Reported on 01/08/2023)     . hydroxychloroquine (PLAQUENIL) 200 mg tablet Take 200 mg by mouth 2 times daily. (Patient not taking: Reported on 01/08/2023)       No current facility-administered medications on file prior to visit.       Allergies   Allergen Reactions   . Lisinopril Anaphylaxis   . Neomycin-Polymyxin B Gu Unspecified/Patient unsure   . Sulfa (Sulfonamide Antibiotics) Other (See Comments)     Extrasystole     . Levofloxacin Rash     Maculopapular rash   . Neomycin Rash       No family history on file.     Social History     Socioeconomic History   . Marital status: Married   Tobacco Use   . Smoking status: Never   . Smokeless tobacco: Never   Substance and Sexual Activity   .  Alcohol use: No   . Drug use: No       Review of Systems:  Review of Systems   Constitutional:  Positive for fatigue.        Weight is stable    Cardiovascular:  Positive for difficulty breathing on exertion. Negative for chest pain, light-headedness, palpitations  and shortness of breath.   Gastrointestinal: Negative.    Genitourinary: Negative.    Skin:  Negative for new sore/lesion and skin ulcer.   Hematologic/Lymphatic: Negative.         PHYSICAL EXAM     BP 142/80   Pulse 65   Wt (!) 441 lb (200 kg)   SpO2 93%   BMI 58.18 kg/m?     Physical Exam  Vitals reviewed.   Constitutional:       Appearance: He is not ill-appearing.      Comments: Walker    Cardiovascular:      Rate and Rhythm: Regular rhythm.      Heart sounds: Normal heart sounds.   Musculoskeletal:      Right lower leg: Edema present.      Left lower leg: Edema present.      Comments: Minimal pitting edema bilaterally  Lymphedema lower extremities bilaterally    Skin:     General: Skin is warm and dry.   Neurological:      Mental Status: Mental status is at baseline.   Psychiatric:         Mood and Affect: Mood normal.         Labs:  01/01/2023  Glucose 127 BUN 58 creatinine 1.87 GFR 36 sodium 145 potassium 4.8 chloride 113 BUN to creatinine ratio 31 bicarbonate 22 calcium 9.1 phosphorus 3.7 albumin 3.3    12/24/2022  A1c 6.3%  Creatinine 1.98    12/13/2022  Creatinine 1.84        ASSESSMENT AND PLAN       ICD-10-CM   1. Stage 3b chronic kidney disease (HCC)  N18.32   2. Primary hypertension  I10   3. Secondary hyperparathyroidism (HCC)  N25.81   4. Hyperkalemia  E87.5   5. Metabolic acidosis  E87.20   6. Chronic heart failure with preserved ejection fraction (HCC)  I50.32   7. Renal cyst  N28.1   8. Chronic right-sided heart failure (HCC)  I50.812        1. Stage 3b chronic kidney disease (HCC)  Overview:  Status: Stable  Baseline Cr: 1.4-1.7  Likely cause: HTN/DM  Imaging: Uptodate  Urine protein: ++  ACE/ARB: Taking  SGLT2 inh:  NotTaking    Assessment & Plan:  Status: Stable within stage 3b chronic kidney disease with a creatinine of 1.87-2 while on diuretics and slightly above baseline.  Most recently escalation of furosemide to 40 mg.  Underlying proteinuria may be contributing to leg swelling; we will pull in antiproteinuric agents as tolerated.  History hyperkalemia no longer requiring Lokelma with volume under good control on furosemide 40 mg, which this dose is likely aligned with slightly elevated Cr, which is acceptable.      Proteinuria: UPC is currently 1.2g, was 2 g in June 2024, and prior [facility] in November 2023 was 1.46.  Albumin is 3.3.  Diabetes is under consistently good control with an A1c of 6.3% most recently.  He tells me he doses insulin on a sliding scale.  Continue olmesartan 40 mg daily.  We will start Farxiga 10 mg, with 28 tablet samples provided and plan for renal panel in 1.5 week.  We discussed the risk for worsened kidney function, genitourinary infection, and association of some studies with increased amputation if lower extremity wounds exist.      Electrolytes:  Potassium is in range at 4.8. Stay off [facility] for now and continue current sodium bicarbonate supplementation.  We will lower  furosemide to 20 mg and 20 mg as needed weight-based dosing in the setting of anticipated at least initial diuretic effect the first 3 months expected of Farxiga.      Medication Plan:  Start Farxiga 10 mg daily.  (Samples #27 given).  Decrease furosemide to 20 mg daily and may take additional weight/presentation-based as-needed 20 mg daily.   Return to use of vitamin D3 1000 units daily.     Recommendation:  Limit sodium intake to less than 2000 mg daily.  Target ideal body weight.  Daily planned exercise.  Hydration target about 2000 mL daily.  Blood pressure target < 135/80.  Avoid NSAIDs. Continue excellent diabetes control.      Lab Plan: 1.5 week and 1 week prior to upcoming appointment.    Follow-up Plan:  Dr. Welton Flakes 1  month     Orders:  -     Renal Function Panel -Routine; Future; Expected date: 02/07/2023  -     CBC with Auto Differential -Routine; Future; Expected date: 02/07/2023  -     Ferritin -Routine; Future; Expected date: 02/07/2023  -     Iron & Total Iron Binding Capacity -Routine; Future; Expected date: 02/07/2023  -     Microalbumin/Creatinine Ratio -Routine; Future; Expected date: 02/07/2023  -     PTH Hormone Intact -Routine; Future; Expected date: 02/07/2023  -     Urinalysis with Microscopic, Culture if Indicated -Routine; Future; Expected date: 02/07/2023  -     dapagliflozin propanediol (FARXIGA) 10 mg tablet; Take 1 tablet by mouth once daily.  Dispense: 30 tablet; Refill: 12  -     Total Protein/Creatinine Ratio, Urine, Random -Routine; Future; Expected date: 01/15/2023  -     Renal Function Panel -Routine; Future; Expected date: 01/15/2023  -     furosemide (LASIX) 20 mg tablet; Take 1 tablet by mouth once daily. May take additional 20 mg tablet (total 40 mg ) as needed for weight gain 3 lb or edema  Dispense: 120 tablet; Refill: 4  -     25-OH Vitamin D Total D2+D3 -Routine; Future; Expected date: 02/08/2023    2. Primary hypertension  Overview:  Acceptable control at home    Assessment & Plan:  Home readings: Above target 140s/60s in the morning hours and systolic improved to 120s in the afternoon following medication having been taken  Office readings: slightly above target  No episodes of hypotension   Status: uncontrolled  Significant lymphedema bilaterally of legs, very minimal pitting edema bilaterally  Weight is overall up over the past month on current furosemide 40 mg daily.  Patient associates with clothing/shoes, as home weight is consistent   Recommend restricting intake of Na to less than 2 gm/24 hours.  Recommend a cumulative duration of moderate intensity physical activity at least 150 minutes/week or to a level compatible with physical and cardiovascular tolerance.  Recommend restricting  alcohol intake.  Therapy: Start Farxiga 10 mg daily. Decrease furosemide to 20 mg and weight-based additional 20 mg.   Report if systolic trend below 110 or above 160.        3. Secondary hyperparathyroidism (HCC)  Assessment & Plan:  Vitamin D level is low at 27.4.  PTH slightly elevated at 192.  Corrected calcium is 9.7.  Patient had stopped taking vitamin D3.  Return to use dose escalation limited by calcium, and in the setting of close follow-up we will consider calcitriol f PTH is still above 150 or so.  4. Hyperkalemia  Assessment & Plan:  Potassium is in range 4.8.  Continue to hold [facility] for now.  Marcelline Deist was started, which associated we lowered furosemide to 20 mg daily and as-needed 20 mg daily.  Bicarbonate is in range on supplementation.       5. Metabolic acidosis  Assessment & Plan:  Bicarbonate is in range on current supplementation three tablets daily.       6. Chronic heart failure with preserved ejection fraction (HCC)  Assessment & Plan:  Well-compensated today on furosemide 40 mg daily.  Will start Farxiga 10 mg for anti-proteinuric effect and heart failure risk reduction.  Will anticipate initial diuretic effect, so lower furosemide to 20mg  daily and may use additional 20 mg as needed.      7. 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 Chronic right-sided heart failure (HCC)         Return in about 1 month (around 02/08/2023) for Dr. Welton Flakes.    Leonette Nutting, NP  Nurse Practitioner, [facility]     Approximately 45 minutes were spent reviewing medical records, medications, labs and imaging, discussion of results, lifestyle counseling, ordering relevant tests and medications as indicated, and collaborating with appropriate specialists.         I addressed medical care services that are part of ongoing care related to a patient's single, serious  condition or a complex condition OR serve as the continuing focal point for all needed health care services.      Disclaimer: This note was created using a voice recognition system. Despite concurrent proofreading, please note that homonyms and other transcription errors may be present and that these errors may not truly reflect my intent.

## 2023-01-08 NOTE — Assessment & Plan Note (Addendum)
Well-compensated today on furosemide 40 mg daily.  Will start Farxiga 10 mg for anti-proteinuric effect and heart failure risk reduction.  Will anticipate initial diuretic effect, so lower furosemide to 20mg  daily and may use additional 20 mg as needed.

## 2023-01-08 NOTE — Assessment & Plan Note (Addendum)
Status: Stable within stage 3b chronic kidney disease with a creatinine of 1.87-2 while on diuretics and slightly above baseline.  Most recently escalation of furosemide to 40 mg.  Underlying proteinuria may be contributing to leg swelling; we will pull in antiproteinuric agents as tolerated.  History hyperkalemia no longer requiring Lokelma with volume under good control on furosemide 40 mg, which this dose is likely aligned with slightly elevated Cr, which is acceptable.      Proteinuria: UPC is currently 1.2g, was 2 g in June 2024, and prior [facility] in November 2023 was 1.46.  Albumin is 3.3.  Diabetes is under consistently good control with an A1c of 6.3% most recently.  He tells me he doses insulin on a sliding scale.  Continue olmesartan 40 mg daily.  We will start Farxiga 10 mg, with 28 tablet samples provided and plan for renal panel in 1.5 week.  We discussed the risk for worsened kidney function, genitourinary infection, and association of some studies with increased amputation if lower extremity wounds exist.      Electrolytes:  Potassium is in range at 4.8. Stay off [facility] for now and continue current sodium bicarbonate supplementation.  We will lower furosemide to 20 mg and 20 mg as needed weight-based dosing in the setting of anticipated at least initial diuretic effect the first 3 months expected of Farxiga.      Medication Plan:  Start Farxiga 10 mg daily.  (Samples #27 given).  Decrease furosemide to 20 mg daily and may take additional weight/presentation-based as-needed 20 mg daily.   Return to use of vitamin D3 1000 units daily.     Recommendation:  Limit sodium intake to less than 2000 mg daily.  Target ideal body weight.  Daily planned exercise.  Hydration target about 2000 mL daily.  Blood pressure target < 135/80.  Avoid NSAIDs. Continue excellent diabetes control.      Lab Plan: 1.5 week and 1 week prior to upcoming appointment.    Follow-up Plan:  Dr. Welton Flakes 1 month

## 2023-01-08 NOTE — Assessment & Plan Note (Signed)
Bicarbonate is in range on current supplementation three tablets daily.

## 2023-01-08 NOTE — Assessment & Plan Note (Addendum)
Vitamin D level is low at 27.4.  PTH slightly elevated at 192.  Corrected calcium is 9.7.  Patient had stopped taking vitamin D3.  Return to use dose escalation limited by calcium, and in the setting of close follow-up we will consider calcitriol f PTH is still above 150 or so.

## 2023-01-09 ENCOUNTER — Ambulatory Visit: Payer: Medicare HMO | Admitting: Cardiology

## 2023-02-07 ENCOUNTER — Other Ambulatory Visit: Payer: Self-pay | Admitting: Family Medicine

## 2023-02-14 ENCOUNTER — Ambulatory Visit: Admit: 2023-02-14 | Discharge: 2023-02-14 | Payer: MEDICARE | Attending: NP

## 2023-02-14 DIAGNOSIS — N1832 Chronic kidney disease, stage 3b (HCC): Secondary | ICD-10-CM

## 2023-02-14 MED ORDER — sodium bicarbonate 650 mg tablet
650 | ORAL_TABLET | ORAL | 3 refills | Status: DC
Start: 2023-02-14 — End: 2023-02-25

## 2023-02-14 MED ORDER — empagliflozin (JARDIANCE) 10 mg tablet
10 | ORAL_TABLET | Freq: Every day | ORAL | 2 refills | 30.00000 days | Status: DC
Start: 2023-02-14 — End: 2023-11-01

## 2023-02-14 NOTE — Progress Notes (Signed)
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  Most recently escalation of furosemide to 40 mg.  Underlying proteinuria may be contributing to leg swelling; we will pull in antiproteinuric agents as tolerated.  History hyperkalemia no longer requiring Lokelma with volume under good control on furosemide 40 mg, which this dose is likely aligned with slightly elevated Cr, which is acceptable.      Proteinuria: UPC is currently 1.2g, was 2 g in June 2024, and prior [facility] in November 2023 was 1.46.  Albumin is 3.3.  Diabetes is under consistently good control with an A1c of 6.3% most recently.  He tells me he doses insulin on a sliding scale.  Continue olmesartan 40 mg daily.  We will start Farxiga 10 mg, with 28 tablet samples provided and plan for renal panel in 1.5 week.  We discussed the risk for worsened kidney function, genitourinary infection, and association of some studies with increased amputation if lower extremity wounds exist.      Electrolytes:  Potassium is in range at 4.8. Stay off [facility] for now and continue current sodium bicarbonate supplementation.  We will lower furosemide to 20 mg and 20 mg as needed weight-based dosing in the setting of anticipated at least initial diuretic effect the first 3 months expected of Farxiga.      Medication Plan:  Start Farxiga 10 mg daily.  (Samples #27 given).  Decrease furosemide to 20 mg daily and may take additional weight/presentation-based as-needed 20 mg daily.   Return to use of vitamin D3 1000 units daily.     Recommendation:  Limit sodium intake to less than 2000 mg daily.  Target ideal body weight.  Daily planned exercise.  Hydration target about 2000 mL daily.  Blood pressure target < 135/80.   Avoid NSAIDs. Continue excellent diabetes control.      Lab Plan: 1.5 week and 1 week prior to upcoming appointment.    Follow-up Plan:  Dr. Welton Flakes 1 month   __________________________________________________  Subjective from today:       He feels well.   He denies recent hospitalizations, chest pain, or lightheadedness.  He is hydrating well.  He is avoiding nonsteroidals.  Home blood pressures 140-150s BEFORE pills and 120s-130s AFTER the pills take effect.  His scale stopped working at home recently.    Loose stools for a few weeks since starting the Farxiga. Cost was around $40.   Past Medical History:   Diagnosis Date    COPD (chronic obstructive pulmonary disease) (HCC)     Diabetes mellitus, type II (HCC)     Hypercholesteremia     Hypertension     Obesity, morbid (HCC)        Current Outpatient Medications on File Prior to Visit   Medication Sig Dispense Refill    albuterol (PROAIR HFA) 90 mcg/actuation inhaler INHALE ONE PUFF BY MOUTH EVERY 4 HOURS AS NEEDED      amLODIPine (NORVASC) 2.5 MG tablet Take 5 mg by mouth once daily.      ammonium lactate (LAC-HYDRIN) 12 % lotion Apply 12 g topically as needed for Dry skin.      aspirin 81 MG EC tablet 81 mg.  atorvastatin (LIPITOR) 40 mg tablet Take 40 mg by mouth once daily.      cholecalciferol (VITAMIN D3) 25 mcg (1,000 unit) tablet Take 1 tablet by mouth once daily.      custom medication every night at bedtime. Med Name: cpap      cyanocobalamin (VITAMIN B-12) 1000 MCG tablet once daily.      diclofenac (VOLTAREN ARTHRITIS PAIN) 1 % topical gel Apply topically daily as needed. Effected area      DULoxetine (CYMBALTA) 20 mg capsule Take 20 mg by mouth once daily.      fluticasone-umeclidinium-vilanterol (TRELEGY ELLIPTA) 200-62.5-25 mcg inhaler Inhale 1 puff into the lungs once daily.      furosemide (LASIX) 20 mg tablet Take 1 tablet by mouth once daily. May take additional 20 mg tablet (total 40 mg ) as needed for weight gain 3 lb or edema 120 tablet 4     gabapentin (NEURONTIN) 300 mg capsule Take 300 mg by mouth 3 times daily. 1am, 1 noon, 2 night      hydroxychloroquine (PLAQUENIL) 200 mg tablet Take 200 mg by mouth 2 times daily.      metoprolol tartrate (LOPRESSOR) 50 MG tablet TAKE ONE TABLET BY MOUTH TWICE A DAY      olmesartan (BENICAR) 40 mg tablet Take 20 mg by mouth once daily. Unknown dosage      testosterone (ANDROGEL) 12.5 mg/ 1.25 gram (1 %) GlPm Apply 4 Act topically Daily.      triamcinolone (KENALOG) 0.1 % cream Apply  topically 2 times daily.      HYDROcodone-acetaminophen (NORCO) 10-325 mg tablet Take 1 tablet by mouth once daily. (Patient not taking: Reported on 01/08/2023)      insulin aspart (NOVOLOG) 100 unit/mL PEN Inject 22 Units under the skin 3 times daily with meals. 1 pen 0     No current facility-administered medications on file prior to visit.       Allergies   Allergen Reactions    Lisinopril Anaphylaxis    Neomycin-Polymyxin B Gu Unspecified/Patient unsure    Sulfa (Sulfonamide Antibiotics) Other (See Comments)     Extrasystole      Levofloxacin Rash     Maculopapular rash    Neomycin Rash         REVIEW OF SYSTEMS     NEGATIVE OR AS ADDRESSED ABOVE    PHYSICAL EXAM     BP 138/64 (BP Location: Right forearm, Patient Position: Sitting)   Pulse 67   Ht 6' 1 (1.854 m)   Wt (!) 414 lb (187.8 kg)   SpO2 94%   BMI 54.62 kg/m?     Physical Exam   General: No acute distress. Uses a wheeled walker  Resp: b/l symmetrical chest rise, no increased work of breathing. Lung sounds with dim RLL.  Cardiac: RRR, bradycardia  Periph Vasc: 1+ LE edema b/l and  Abdomen: Soft  Neuro: Alert and oriented   Psych: Mood and affect wnl        Labs:  02/04/23  BUN 66, creatinine 2.13, estimated GFR 31, sodium 143, potassium 5.4, bicarbonate 15, calcium 9.2, phosphorus 5.4, albumin 3.6, transferrin saturation 34%, total vitamin D 41,  Hemoglobin 12.6,   UA with hyaline cats, bacteria few.  Urine protein/creatinine ratio 1.0 g.  Albumin/creatinine ratio  452, ferritin 162, PTH 88    01/01/2023  Glucose 127 BUN 58 creatinine 1.87 GFR 36 sodium 145 potassium 4.8 chloride 113 BUN to creatinine ratio 31 bicarbonate 22 calcium 9.1  phosphorus 3.7 albumin 3.3    12/24/2022  A1c 6.3%  Creatinine 1.98    12/13/2022  Creatinine 1.84     ASSESSMENT AND PLAN     1. Stage 3b chronic kidney disease (HCC)  Overview:  Status: Stable  Baseline Cr: 1.4-1.7>>2.1  Likely cause: HTN/DM  Imaging: Uptodate  Urine protein: ++  ACE/ARB: Taking  SGLT2 inh: NotTaking    Assessment & Plan:   Stable creatinine of  2.1 mg/dl, from  1.61 mg/dl. Marcelline Deist was added at the last visit.  Urine p/c ratio 1.0g from 1.2g. On Benicar.  Diuretics: On furosemide and torsemide.  Education provided on his hyperkalemia with most recent level of 5.4. It will be rechecked.  Uncontrolled with CO2 15. As he has had loose stools since starting the Farxiga we will see how he does with Jardiance.  Blood pressure is controlled.  Continue to avoid oral nonteroidals.   Return in about 4 weeks (around 03/14/2023). with lab prior.      Orders:  -     Renal Function Panel -Routine; Future; Expected date: 02/21/2023    2. Hyperkalemia  Assessment & Plan:   Uncontrolled. Education provided on his hyperkalemia with most recent level of 5.4. It will be rechecked.      3. Primary hypertension  Overview:  Acceptable control at home      4. Anemia of chronic renal failure, stage 3a  (HCC)  Assessment & Plan:  Reasonable control with minor anemia of 12.6g/dl in a patient with some anemia of renal disease.       5. Secondary hyperparathyroidism (HCC)  Assessment & Plan:  Undetermined control. Will check a PTH prior to the next visit.       6. Metabolic acidosis  Assessment & Plan:  Uncontrolled with CO2 15. As he has had loose stools since starting the Farxiga we will see how he does with Jardiance.      Other orders  -     empagliflozin (JARDIANCE) 10 mg tablet; Take 1 tablet by mouth once daily.  Dispense: 30 tablet; Refill: 2          Return in about 4 weeks (around 03/14/2023).         I addressed medical care services that are part of ongoing care related to a patient's single, serious condition or a complex condition OR serve as the continuing focal point for all needed health care services.      This dictation was produced using voice recognition software. Despite concurrent proofreading, please note that homonyms and other transcription errors may be present and that these errors may not truly reflect my intent.     Phineas Real, NP

## 2023-02-14 NOTE — Assessment & Plan Note (Addendum)
Stable creatinine of  2.1 mg/dl, from  1.61 mg/dl. Evan [name] was added at the last visit.  Urine p/c ratio 1.0g from 1.2g. On Benicar.  Diuretics: On furosemide and torsemide.  Education provided on his hyperkalemia with most recent level of 5.4. It will be rechecked.  Uncontrolled with CO2 15. As he has had loose stools since starting the Farxiga we will see how he does with Jardiance.  Blood pressure is controlled.  Continue to avoid oral nonteroidals.   Return in about 4 weeks (around 03/14/2023). with lab prior.

## 2023-02-15 ENCOUNTER — Encounter: Payer: MEDICARE | Attending: Student in an Organized Health Care Education/Training Program

## 2023-02-21 ENCOUNTER — Encounter: Payer: Self-pay | Admitting: Family Medicine

## 2023-02-21 ENCOUNTER — Ambulatory Visit (INDEPENDENT_AMBULATORY_CARE_PROVIDER_SITE_OTHER): Payer: Medicare HMO | Admitting: Family Medicine

## 2023-02-21 VITALS — BP 172/53 | HR 53 | Ht 70.0 in | Wt 165.0 lb

## 2023-02-21 DIAGNOSIS — I10 Essential (primary) hypertension: Secondary | ICD-10-CM

## 2023-02-21 NOTE — Progress Notes (Signed)
Glenn Lopez - 77 y.o. male MRN [id number]  Date of birth: July 05, 1945  Subjective Chief Complaint  Patient presents with   Hypertension    HPI Glenn Lopez is a 77 y.o. male here today for follow up visit.   He continues on valsartan 160mg  daily.  Cut amlodipine back to 2.5mg  as he felt that blood pressures were looking pretty good.   He has actually continued to see his previous PCP at [facility] as well.  He plans to continue to see his current PCP with [facility].  I discussed with him that he could not have 2 primary care providers.  I also let him know that his medications will need to come from his current PCP.  BP is elevated today and I encouraged him to follow up with his PCP.    ROS:  A comprehensive ROS was completed and negative except as noted per HPI  No Known Allergies  Past Medical History:  Diagnosis Date   CAD (coronary artery disease)    COPD (chronic obstructive pulmonary disease) (HCC) 09/07/2021   GERD (gastroesophageal reflux disease)    Heart murmur    Hiatal hernia 07/16/2021   Hyperlipidemia    Hypertension     Past Surgical History:  Procedure Laterality Date   cardiac catherization     CHOLECYSTECTOMY     HERNIA REPAIR      Social History   Socioeconomic History   Marital status: Divorced    Spouse name: Not on file   Number of children: 0   Years of education: Not on file   Highest education level: Some college, no degree  Occupational History   Occupation: Retired  Tobacco Use   Smoking status: Former    Current packs/day: 0.00    Average packs/day: 0.3 packs/day for 15.0 years (3.8 ttl pk-yrs)    Types: Cigarettes, Pipe    Start date: 07/07/1984    Quit date: 07/08/1999    Years since quitting: 23.6    Passive exposure: Past   Smokeless tobacco: Never   Tobacco comments:    None  Vaping Use   Vaping status: Never Used  Substance and Sexual Activity   Alcohol use: Yes    Alcohol/week: 5.0 standard drinks of alcohol    Types: 5  Shots of liquor per week    Comment: Occasional   Drug use: Never   Sexual activity: Not Currently  Other Topics Concern   Not on file  Social History Narrative   Not on file   Social Determinants of Health   Financial Resource Strain: Low Risk  (12/18/2022)   Received from [facility]   Overall Financial Resource Strain (CARDIA)    Difficulty of Paying Living Expenses: Not hard at all  Food Insecurity: No Food Insecurity (02/17/2023)   Hunger Vital Sign    Worried About Running Out of Food in the Last Year: Never true    Ran Out of Food in the Last Year: Never true  Transportation Needs: No Transportation Needs (02/17/2023)   PRAPARE - Administrator, Civil Service (Medical): No    Lack of Transportation (Non-Medical): No  Physical Activity: Insufficiently Active (02/17/2023)   Exercise Vital Sign    Days of Exercise per Week: 2 days    Minutes of Exercise per Session: 10 min  Stress: No Stress Concern Present (12/18/2022)   Received from [employer] - Occupational Stress Questionnaire    Feeling of  Stress : Not at all  Social Connections: Unknown (02/17/2023)   Social Connection and Isolation Panel [NHANES]    Frequency of Communication with Friends and Family: Patient declined    Frequency of Social Gatherings with Friends and Family: Once a week    Attends Religious Services: More than 4 times per year    Active Member of Clubs or Organizations: Yes    Attends Banker Meetings: 1 to 4 times per year    Marital Status: Divorced    Family History  Problem Relation Age of Onset   Stroke Mother    Heart attack Father    Early death Brother     Health Maintenance  Topic Date Due   COVID-19 Vaccine (3 - Moderna risk series) 03/09/2023 (Originally 12/26/2019)   INFLUENZA VACCINE  09/16/2023 (Originally 01/17/2023)   Hepatitis C Screening  02/21/2024 (Originally 08/29/1963)   Medicare Annual Wellness (AWV)   12/21/2023   DTaP/Tdap/Td (2 - Td or Tdap) 09/02/2024   Pneumonia Vaccine 45+ Years old  Completed   Zoster Vaccines- Shingrix  Completed   HPV VACCINES  Aged Out     ----------------------------------------------------------------------------------------------------------------------------------------------------------------------------------------------------------------- Physical Exam BP (!) 172/53 (BP Location: Left Arm, Patient Position: Sitting, Cuff Size: Normal)   Pulse (!) 53   Ht 5\' 10"  (1.778 m)   Wt 165 lb (74.8 kg)   SpO2 100%   BMI 23.68 kg/m   Physical Exam Constitutional:      Appearance: Normal appearance.  HENT:     Head: Normocephalic and atraumatic.  Cardiovascular:     Rate and Rhythm: Normal rate and regular rhythm.  Pulmonary:     Effort: Pulmonary effort is normal.     Breath sounds: Normal breath sounds.  Musculoskeletal:     Cervical back: Neck supple.  Neurological:     General: No focal deficit present.     Mental Status: He is alert.  Psychiatric:        Mood and Affect: Mood normal.        Behavior: Behavior normal.     ------------------------------------------------------------------------------------------------------------------------------------------------------------------------------------------------------------------- Assessment and Plan  Benign essential hypertension BP is not well controlled today.  Denies symptoms.  I recommended that he follow up with his regular PCP ASAP for titration of BP medications.  Removing myself as PCP.     No orders of the defined types were placed in this encounter.   No follow-ups on file.    This visit occurred during the SARS-CoV-2 public health emergency.  Safety protocols were in place, including screening questions prior to the visit, additional usage of staff PPE, and extensive cleaning of exam room while observing appropriate contact time as indicated for disinfecting solutions.

## 2023-02-21 NOTE — Assessment & Plan Note (Signed)
BP is not well controlled today.  Denies symptoms.  I recommended that he follow up with his regular PCP ASAP for titration of BP medications.  Removing myself as PCP.

## 2023-02-22 NOTE — Telephone Encounter (Signed)
Bicarb 15, patient had not increased the dose since last appt. He will increase starting today.  Creatinine 2.4 from previous 2.1, he started Jardiance.  Pt has appt coming up 9/26, having lab next week.

## 2023-02-25 MED ORDER — sodium bicarbonate 650 mg tablet
650 | ORAL_TABLET | ORAL | 6 refills | Status: DC
Start: 2023-02-25 — End: 2023-11-01

## 2023-02-25 NOTE — Telephone Encounter (Signed)
Refilled per protocol.

## 2023-02-25 NOTE — Telephone Encounter (Signed)
RX refill request    Pharmacy (Name & location): [facility] on [address]    Is this a new pharmacy?: no    Have you already contacted your pharmacy? Yes pharmacy needs a new prescription to fill.   (Encourage pt to do so)    Medication Being Requested: Sodium Bicarbonate    Dose & Frequency: 650 mg 3 in the am and 2 in the pm    How many days (or pills) remaining of medication: 5 days     INFORM Patient    That it may take up to 48-72 hours to fill prescription.    We do not call patients back to confirm RX has been sent.     We will call if we have a question regarding their prescription.

## 2023-02-25 NOTE — Addendum Note (Signed)
Addended by: Dionicio Stall on: 02/25/2023 01:20 PM     Modules accepted: Orders

## 2023-02-26 NOTE — Telephone Encounter (Signed)
Echo was done on 8/14 at [place]. Notes are in care everywhere.

## 2023-03-05 NOTE — Telephone Encounter (Signed)
Lab not yet collected.

## 2023-03-05 NOTE — Progress Notes (Signed)
HPI: FU syncope. Previously followed by Dr. Danley Danker with [facility]. Stress echocardiogram November 2022 showed normal LV function and ischemia in the inferior/inferolateral walls by report.  Cardiac catheterization December 2022 showed 30% mid LAD and otherwise minor irregularities.  CTA March 2023 showed 50% or less stenosis in the right and left proximal internal carotid arteries.  Carotid Dopplers May 2023 technically difficult; there was suggestion of significant stenosis in the left internal carotid artery but report states "I would rely more on findings from recent CTA". Echocardiogram June 2023 showed normal LV function, grade 1 diastolic dysfunction, trace aortic insufficiency.  Monitor July 2023 showed sinus rhythm with PACs, brief PAT, PVCs, 4 and 6 beat runs of nonsustained ventricular tachycardia. Patient has had difficulties with orthostatic hypotension and syncope. Since last seen, patient complains of dizziness with standing at times.  He has not had syncope.  He does not have the dizziness with lying down though he does occasionally feel it and a mild degree sitting.  He complains of dyspnea on exertion but no orthopnea, PND, pedal edema or chest pain.  Current Outpatient Medications  Medication Sig Dispense Refill   amLODipine (NORVASC) 2.5 MG tablet TAKE 1 TABLET BY MOUTH DAILY 90 tablet 0   ascorbic acid (VITAMIN C) 250 MG tablet Take by mouth.     aspirin EC 81 MG tablet Take 81 mg by mouth daily. Swallow whole.     atorvastatin (LIPITOR) 80 MG tablet Take 80 mg by mouth daily.     Coenzyme Q10 50 MG CAPS Take by mouth.     Cyanocobalamin (VITAMIN B 12 PO) Take by mouth.     Magnesium 250 MG TABS Take by mouth.     Omega-3 Fatty Acids (FISH OIL) 1000 MG CAPS Take by mouth.     omeprazole (PRILOSEC) 20 MG capsule Take 20 mg by mouth daily.     valsartan (DIOVAN) 160 MG tablet TAKE 1 TABLET BY MOUTH DAILY . MUST KEEP SEPT APPOINTMENT 30 tablet 0   amLODipine (NORVASC) 5  MG tablet Take 1 tablet (5 mg total) by mouth daily. (Patient not taking: Reported on 02/21/2023) 90 tablet 1   No current facility-administered medications for this visit.     Past Medical History:  Diagnosis Date   CAD (coronary artery disease)    COPD (chronic obstructive pulmonary disease) (HCC) 09/07/2021   GERD (gastroesophageal reflux disease)    Heart murmur    Hiatal hernia 07/16/2021   Hyperlipidemia    Hypertension     Past Surgical History:  Procedure Laterality Date   cardiac catherization     CHOLECYSTECTOMY     HERNIA REPAIR      Social History   Socioeconomic History   Marital status: Divorced    Spouse name: Not on file   Number of children: 0   Years of education: Not on file   Highest education level: Some college, no degree  Occupational History   Occupation: Retired  Tobacco Use   Smoking status: Former    Current packs/day: 0.00    Average packs/day: 0.3 packs/day for 15.0 years (3.8 ttl pk-yrs)    Types: Cigarettes, Pipe    Start date: 07/07/1984    Quit date: 07/08/1999    Years since quitting: 23.7    Passive exposure: Past   Smokeless tobacco: Never   Tobacco comments:    None  Vaping Use   Vaping status: Never Used  Substance and Sexual Activity   Alcohol  use: Yes    Alcohol/week: 5.0 standard drinks of alcohol    Types: 5 Shots of liquor per week    Comment: Occasional   Drug use: Never   Sexual activity: Not Currently  Other Topics Concern   Not on file  Social History Narrative   Not on file   Social Determinants of Health   Financial Resource Strain: Low Risk  (12/18/2022)   Received from [facility]   Overall Financial Resource Strain (CARDIA)    Difficulty of Paying Living Expenses: Not hard at all  Food Insecurity: No Food Insecurity (02/17/2023)   Hunger Vital Sign    Worried About Running Out of Food in the Last Year: Never true    Ran Out of Food in the Last Year: Never true  Transportation Needs: No Transportation  Needs (02/17/2023)   PRAPARE - Administrator, Civil Service (Medical): No    Lack of Transportation (Non-Medical): No  Physical Activity: Insufficiently Active (02/17/2023)   Exercise Vital Sign    Days of Exercise per Week: 2 days    Minutes of Exercise per Session: 10 min  Stress: No Stress Concern Present (12/18/2022)   Received from [facility] of Occupational Health - Occupational Stress Questionnaire    Feeling of Stress : Not at all  Social Connections: Unknown (02/17/2023)   Social Connection and Isolation Panel [NHANES]    Frequency of Communication with Friends and Family: Patient declined    Frequency of Social Gatherings with Friends and Family: Once a week    Attends Religious Services: More than 4 times per year    Active Member of [employer] or Organizations: Yes    Attends Banker Meetings: 1 to 4 times per year    Marital Status: Divorced  Intimate Partner Violence: Not At Risk (12/18/2022)   Received from [facility]   HITS    Over the last 12 months how often did your partner physically hurt you?: 1    Over the last 12 months how often did your partner insult you or talk down to you?: 1    Over the last 12 months how often did your partner threaten you with physical harm?: 1    Over the last 12 months how often did your partner scream or curse at you?: 1    Family History  Problem Relation Age of Onset   Stroke Mother    Heart attack Father    Early death Brother     ROS: no fevers or chills, productive cough, hemoptysis, dysphasia, odynophagia, melena, hematochezia, dysuria, hematuria, rash, seizure activity, orthopnea, PND, pedal edema, claudication. Remaining systems are negative.  Physical Exam: Well-developed well-nourished in no acute distress.  Skin is warm and dry.  HEENT is normal.  Neck is supple.  Chest is clear to auscultation with normal expansion.  Cardiovascular exam is regular rate and rhythm.  Abdominal  exam nontender or distended. No masses palpated. Extremities show no edema. neuro grossly intact  EKG Interpretation Date/Time:  Monday March 18 2023 09:09:47 EDT Ventricular Rate:  63 PR Interval:  142 QRS Duration:  86 QT Interval:  414 QTC Calculation: 423 R Axis:   9  Text Interpretation: Normal sinus rhythm Low voltage QRS Cannot rule out Anterior infarct , age undetermined ST & T wave abnormality, consider inferior ischemia Confirmed by Olga Millers ([id number]) on 03/18/2023 9:16:44 AM    A/P  1 syncope-patient has had no recurrent episodes.  Previous  echocardiogram showed preserved LV function.  Previous episode also felt likely to be orthostatic mediated and HCTZ discontinued; beta-blocker discontinued due to history of mild bradycardia.  Will continue to follow.  Note he does have some dizziness with standing at times.  I have asked him to stay hydrated.  I have discontinued his amlodipine to allow his blood pressure to run higher to see if this helps.  2 minimal coronary disease-noted at time of previous catheterization.  Continue statin.  3 hypertension-I am discontinuing his amlodipine to allow his blood pressure to run higher to see if this improves his orthostatic symptoms.  4 hyperlipidemia-continue statin.  5 history of carotid artery disease-previous CTA showed less than 50% bilaterally.  Continue statin.  Olga Millers, MD

## 2023-03-06 ENCOUNTER — Other Ambulatory Visit: Payer: Self-pay | Admitting: Family Medicine

## 2023-03-06 NOTE — Telephone Encounter (Signed)
Labs in, bicarb 18

## 2023-03-06 NOTE — Addendum Note (Signed)
Addended by: Dionicio Stall on: 03/06/2023 02:40 PM     Modules accepted: Orders

## 2023-03-07 NOTE — Telephone Encounter (Signed)
Echo reviewed, has grade 2 diastolic dysfunction, mild AS, no evidence of overt volume overload  I will go over this with him in detail on his office visit but he should tell us if he is having any exertional dyspnea, orthopnea, significant or worsening edema

## 2023-03-08 NOTE — Telephone Encounter (Signed)
Sent message below to patient through [employer]

## 2023-03-12 NOTE — Telephone Encounter (Signed)
Better, but the bicarb level is still marginal at the 18.   He was having loose stools with the Evan [name] so at the last visit we changed to [place]. Are they any better with the Jardiance?

## 2023-03-13 NOTE — Telephone Encounter (Signed)
Pt never read the mychart message  Will let Dr. Review with the patient tomorrow at their office visit with [place].

## 2023-03-13 NOTE — Telephone Encounter (Signed)
Called pt. No answer, left v/m to c/b

## 2023-03-13 NOTE — Telephone Encounter (Signed)
Patient returned call, stated loose stools have improved significantly. Taking 3 tabs in the morning of bicarb and 2 tabs at noon. Has appt. Tomorrow and will discuss further with Dr. Welton Flakes.

## 2023-03-13 NOTE — Telephone Encounter (Signed)
-----   Message from Rhodes D.  sent at 03/10/2023 10:57 AM PDT -----  Regarding: Unread Message Notification  Contact: [phone]  Per Dr. Welton Flakes,    Your Echo was reviewed, you have grade 2 diastolic dysfunction, mild AS, no evidence of overt volume overload. I will go over this in detail with you at your upcoming visit but you should tell us if you are having any exertional shortness of breath standing or laying down or significant or worsening edema. Otherwise we will see you on the 26th.    Thank you,  Lorra Hals, [place]  [facility]

## 2023-03-14 ENCOUNTER — Encounter: Payer: MEDICARE | Attending: Student in an Organized Health Care Education/Training Program

## 2023-03-14 NOTE — Telephone Encounter (Signed)
Completion Date: 02/12/23 - Diabetic Foot Exam abstracted from [facility] per scanned document.    Abnormal, f/up in 3 months.    Care Gaps updated.

## 2023-03-14 NOTE — Telephone Encounter (Signed)
Received records from [facility]     sent to scanning and abstracting

## 2023-03-14 NOTE — Telephone Encounter (Signed)
Evan [name] is requesting a 30 day proscription.     Evan [name]  called Evan R. Hatfield, PA's team regarding Evan [name] on 03/14/2023 at 9:27 AM PDT, requesting a refill for   Requested Prescriptions      No prescriptions requested or ordered in this encounter   .     Pharmacy:   [facility] [phone] - [place], [place] - [address]  [address]  [place] [address]  Phone: [phone] Fax: [phone]      Status: On time  Last Fill: 01/29/23   Last visit: 01/29/2023 Evan [name], Evan [name]  Next visit: 05/01/2023  Last Depression Screening:  PHQ-9 Total score Score   09/05/2015  11:24 AM 8   10/07/2017  12:48 PM 2   09/29/2019  10:18 AM 7   12/29/2019   9:58 AM 4   04/06/2020  10:20 AM 4   12/17/2022  10:39 AM 7   12/31/2022  10:47 AM 10     Urgent/out of med  YES\ 1 pen left  Call back needed   YES        Medication Management Overview:    olmesartan (BENICAR) 40 MG tablet [[id number]]  Rx Protocol: Cardiovascular: Angiotensin Receptor Blockers Passed    Criteria:  Cr within 12 months  (Last Result: 1.98 on 12/24/2022)  K is 5.5 or below and within 12 months  (Last Result: 5.0 on 12/24/2022)  BP on file within last 12 months  (Last Result: 134/73 on 01/29/2023)  Patient is not pregnant  Valid visit within last 12 months  (Last Visit: 01/29/2023)    furosemide (LASIX) 20 mg tablet [[id number]]  Rx Protocol: Cardiovascular: Diuretics - Loop Passed    Criteria:  Cr within 12 months  (Last Result: 1.98 on 12/24/2022)  K in normal range and within 12 months  (Last Result: 5.0 on 12/24/2022)  Na in normal range and within 12 months  (Last Result: 142 on 12/24/2022)  BP on file within last 12 months  (Last Result: 134/73 on 01/29/2023)  Patient is not pregnant  Valid visit within last 12 months  (Last Visit: 01/29/2023)    gabapentin (NEURONTIN) 300 mg capsule [[id number]]  Rx Protocol: Neurology: Anticonvulsants - Gabapentin Passed    Criteria:  Cr within 12  months  (Last Result: 1.98 on 12/24/2022)  eGFR is 60 or above and within 12 months  (Last Result: 34 ! on 12/24/2022)  Patient is not pregnant  Valid visit within last 12 months  (Last Visit: 01/29/2023)    DULoxetine (CYMBALTA) 20 mg DR capsule [[id number]]  Rx Protocol: Psychiatry: SNRI - Duloxetine Passed    Criteria:  Cr within 12 months  (Last Result: 1.98 on 12/24/2022)  eGFR is 30 or above and within 12 months  (Last Result: 34 on 12/24/2022)  BP on file within last 12 months  (Last Result: 134/73 on 01/29/2023)  PHQ-2, PHQ-9 or GAD-7 completed in last 12 months  Patient is not pregnant  Valid visit within last 12 months  (Last Visit: 01/29/2023)    leflunomide (ARAVA) 20 mg tablet [[id number]]  Rx Protocol: Analgesics: Antirheumatic Agents - Leflunomide Failed    Criteria:  Due ALT within 2 months  (Last Result: 22 on 10/03/2022)  Due AST within 2 months  (Last Result: 16 on 10/03/2022)  Due HCT within 2 months  (Last Result: 35.1 on 10/04/2022)  Due  HGB within 2 months  (Last Result: 11.0 on 10/04/2022)  Due PLT within 2 months  (Last Result: 165 on 10/04/2022)  Due WBC within 2 months  (Last Result: 8.67 on 10/04/2022)  BP on file within last 12 months  (Last Result: 134/73 on 01/29/2023)  Patient is not pregnant  Valid visit within last 12 months  (Last Visit: 01/29/2023)  If patient started therapy within the last 6 months, evaluate CBC and liver function every month.  This refill cannot be delegated    insulin glargine (TOUJEO SOLOSTAR) 300 units/mL concentrated injection (pen) [[id number]]  Rx Protocol: Endocrinology: Diabetes - Insulins Passed    Criteria:  HBA1C within 6 months  (Last Result: 6.3 on 12/24/2022)  Valid visit within last 12 months  (Last Visit: 01/29/2023)    FARXIGA 10 MG tablet [[id number]]  Rx Protocol: Endocrinology: Diabetes - SGLT2 Inhibitors Passed    Criteria:  Cr within 12 months  (Last Result: 1.98 on 12/24/2022)  eGFR is 45 or above and within 12 months  (Last Result: 34 ! on 12/24/2022)  HBA1C  within 6 months  (Last Result: 6.3 on 12/24/2022)  Valid visit within last 12 months  (Last Visit: 01/29/2023)            Relevant labs:   Lab Results   Component Value Date    NA 142 12/24/2022    K 5.0 12/24/2022    CREA 1.98 (H) 12/24/2022    EGFR 34 (L) 12/24/2022    GFRNONAA 43 (L) 04/27/2020    ALT 22 10/03/2022    AST 16 10/03/2022     Lab Results   Component Value Date    CHOL 131 04/28/2021    TRIG 163 (H) 04/28/2021    HDL 35 (L) 04/28/2021    LDL 63 04/28/2021    URICACID 6.9 11/20/2016    PSA 0.48 01/17/2022      Lab Results   Component Value Date    WBC 8.67 10/04/2022    HGB 11.0 (L) 10/04/2022    HCT 35.1 (L) 10/04/2022    PLT 165 10/04/2022    INR 1.1 10/03/2022    Lab Results   Component Value Date    TSH 3.56 10/08/2013    GLU 120 (H) 10/04/2022    HBA1C 6.3 (H) 12/24/2022    MICROALBUMIN 237 05/14/2022

## 2023-03-18 ENCOUNTER — Ambulatory Visit: Payer: Medicare HMO | Admitting: Cardiology

## 2023-03-18 ENCOUNTER — Encounter: Payer: Self-pay | Admitting: Cardiology

## 2023-03-18 VITALS — BP 120/58 | HR 63 | Ht 70.0 in | Wt 163.0 lb

## 2023-03-18 DIAGNOSIS — Z87898 Personal history of other specified conditions: Secondary | ICD-10-CM

## 2023-03-18 DIAGNOSIS — I10 Essential (primary) hypertension: Secondary | ICD-10-CM | POA: Diagnosis not present

## 2023-03-18 DIAGNOSIS — I2510 Atherosclerotic heart disease of native coronary artery without angina pectoris: Secondary | ICD-10-CM | POA: Diagnosis not present

## 2023-03-18 DIAGNOSIS — E785 Hyperlipidemia, unspecified: Secondary | ICD-10-CM | POA: Diagnosis not present

## 2023-03-18 MED ORDER — VALSARTAN 160 MG PO TABS: 160.0000 mg | ORAL_TABLET | Freq: Every day | ORAL | 3 refills | Status: DC

## 2023-03-18 NOTE — Patient Instructions (Signed)
Medication Instructions:   STOP AMLODIPINE  *If you need a refill on your cardiac medications before your next appointment, please call your pharmacy*   Follow-Up: At [facility], you and your health needs are our priority.  As part of our continuing mission to provide you with exceptional heart care, we have created designated Provider Care Teams.  These Care Teams include your primary Cardiologist (physician) and Advanced Practice Providers (APPs -  Physician Assistants and Nurse Practitioners) who all work together to provide you with the care you need, when you need it.  We recommend signing up for the patient portal called "MyChart".  Sign up information is provided on this After Visit Summary.  MyChart is used to connect with patients for Virtual Visits (Telemedicine).  Patients are able to view lab/test results, encounter notes, upcoming appointments, etc.  Non-urgent messages can be sent to your provider as well.   To learn more about what you can do with MyChart, go to [email or web address].    Your next appointment:   4 month(s)  Provider:   Olga Millers, MD

## 2023-03-21 ENCOUNTER — Ambulatory Visit
Admit: 2023-03-21 | Discharge: 2023-03-21 | Payer: MEDICARE | Attending: Student in an Organized Health Care Education/Training Program

## 2023-03-21 DIAGNOSIS — N1832 Chronic kidney disease, stage 3b (HCC): Secondary | ICD-10-CM

## 2023-03-21 NOTE — Assessment & Plan Note (Addendum)
Kidney function has trended higher due to being on an SGLT2 inhibitor, improving proteinuria echo findings were reviewed with him, noted worsening diastolic dysfunction, this will be sent to his cardiologist, I have messaged my staff to do so.  He notes persistent exertional dyspnea though no overt chest pain.  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

## 2023-03-21 NOTE — Patient Instructions (Signed)
Please contact your cardiologist to see if you need an appt

## 2023-03-21 NOTE — Assessment & Plan Note (Signed)
Continue current medications. 

## 2023-03-21 NOTE — Progress Notes (Signed)
[facility]  Follow-up Note    Evan [name]  September 12, 1945    HISTORY OF PRESENT ILLNESS:     Chief Complaint:  77yoM w CKD, HTN, DM, HFpEF, CAD, OSA on CPAP, COPD, RA presents for CKD follow up    Interval history  Has been having arthritic pain, will be reaching out to Dr Harrell Gave w exertional dyspnea, ambulates with walker  Did not feel well w torsemide, lasix worked better  [employer] or dizzy post meds, but blood pressures are consistently more than 120  Echo findings discussed with him    HTN history  Diagnosed 2018  Takes amlodipine 5, olmesartan 20, metop 50bid, lasix 20  Pre meds BP logs: 140-150/60s range    DM history  T2DM  Diagnosed 2018  On insulin  No h/o retinopathy    Renal history  No prior h/o UTIs, leg ulcers/PVD  No recent NSAID/PPI use, COVID infection or stone  Remote h/o smoking    Previous labs (adapted from prior providers):  02/2023 Cr 2.16, GFR 31, UPCR 0.7, Hb 11.7  02/04/23  BUN 66, creatinine 2.13, estimated GFR 31, sodium 143, potassium 5.4, bicarbonate 15, calcium 9.2, phosphorus 5.4, albumin 3.6, transferrin saturation 34%, total vitamin D 41,  Hemoglobin 12.6,   UA with hyaline cats, bacteria few.  Urine protein/creatinine ratio 1.0 g.  Albumin/creatinine ratio 452, ferritin 162, PTH 88  11/2022 creatinine 1.8, GFR 36, potassium 4.8, bicarb 17, UAwith 2+ protein, vitamin D 32, UPCR 2 g, A1c 6, PTH 98  09/2022 Hb 11  05/2022 Cr 1.73, GFR 40,   04/2022 Cr 1.7, GFR 41, Bic 20, K 5.3, UPCR 1.47  01/17/22  BUN 44 creatinine 1.74 GFR 48 sodium 143 potassium 4.5 bicarbonate 21 glucose 310 albumin 3.2 calcium 8.5 phosphorus 3.6 PTH 145  Hemoglobin 12.5  Microalbumin to creatinine ratio 692  A1c 6.5%  07/07/21 Cr 1.84, GFR 38, K 5.7, Glu 284,  Hb 11.5, UA w neg pro, blood, 0-2W, R, UPCR 0.5  12/08/19 sodium 145 potassium 4.1 chloride 112 carbon dioxide 19 anion gap 14 glucose 149 BUN 35 creatinine 1.68 eGFR 40 cc/min calcium 8.8 phosphorus 4.1 albumin 3.26, UACR 220  ug/mg  03/27/19 Hemoglobin 12.5 Sodium 142 Potassium 4.3 Chloride 109 Carbon dioxide 21 Anion gap 12 Glucose 150 BUN 38 Creatinine 1.58 eGFR 43 cc/min Calcium 9.1 Phosphorus 4.5 Albumin 3.8 UACR 107 ug/mg  08/07/18 BUN 39 creatinine 1.63 eGFR 42 cc/m  04/03/18  BUN 35 creatinine 1.51 eGFR 46 cc/min Urine protein 11 Urine creatinine 29 UPC 0.4 g/day 25-OH Vitamin D 29.2  11/14/17 BUN 35 creatinine 1.51 eGFR 46 cc/min  04/24/17 creatinine 1.67  12/31/16 creatinine 1.75  11/22/16 creatinine 1.46  11/20/16 creatinine 1.41  11/08/16 creatinine 1.40  08/20/16 creatinine 1.69  12/01/15 creatinine 1.37  11/08/14 creatinine 1.21  04/16/14 creatinine 1.40  11/05/13 creatinine 1.10    Imaging:  Last renal imaging: [place] 2018 R kidney 13.8cm, L kidney 13.8cm, PVR not measured    CT  Left inferior pole lesion has no enhancement and is not suspicious.     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 Consider renal MRI with and without IV contrast and urological consultation.     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 Consider follow-up CT abdomen/pelvis or aortic ultrasound in 12 months to document stability.          Past Medical  History:  has a past medical history of COPD (chronic obstructive pulmonary disease) (HCC), Diabetes mellitus, type II (HCC), Hypercholesteremia, Hypertension, and Obesity, morbid (HCC).     Past Surgical History:  has no past surgical history on file.       Outpatient Medications as of 03/21/2023:   .  albuterol, INHALE ONE PUFF BY MOUTH EVERY 4 HOURS AS NEEDED  .  amLODIPine, 5 mg, Oral, Daily  .  ammonium lactate, 12 g, Topical, PRN  .  aspirin, 81 mg.  .  atorvastatin, 40 mg, Oral, Daily  .  cholecalciferol, 1,000 units, Oral, Daily  .  custom medication, every night at bedtime. Med Name: cpap  .  cyanocobalamin, once daily.  .  diclofenac, Apply topically daily as needed. Effected area  .  DULoxetine, 20 mg, Oral, Daily  .   empagliflozin, 10 mg, Oral, Daily  .  TRELEGY ELLIPTA, 1 puff, Inhalation, Daily  .  furosemide, 20 mg, Oral, Daily  .  gabapentin, 300 mg, Oral, TID  .  hydroxychloroquine, 200 mg, Oral, BID  .  insulin glargine U-300, Inject under the skin every night at bedtime. Sliding scale 40-75 units  .  metoprolol tartrate, TAKE ONE TABLET BY MOUTH TWICE A DAY  .  olmesartan, 20 mg, Oral, Daily  .  sodium bicarbonate, 3 tablets am, 2 tablet pm  .  testosterone, Apply 4 Act topically Daily.  [name]  triamcinolone, Apply  topically 2 times daily.  [name]  HYDROcodone-acetaminophen, 1 tablet, Oral, Daily (Patient not taking: Reported on 01/08/2023)  .  insulin aspart, 22 units, Subcutaneous, TID with meals     Allergies: is allergic to lisinopril, neomycin-polymyxin b gu, sulfa (sulfonamide antibiotics), levofloxacin, and neomycin.     Family History: family history is not on file.     Social History:  reports that he has never smoked. He has never used smokeless tobacco. He reports that he does not drink alcohol and does not use drugs.     Health Maintenance   Topic Date Due   . Annual Wellness Visit  Never done   . Diabetes: Foot Exam  Never done   . Diabetes: Retinopathy Screening  Never done   . RSV Vaccine (1 - 1-dose 75+ series) Never done   . Diabetes: Urine Protein Screening  12/14/2020   . Tetanus Vaccine (2 - Td or Tdap) 07/03/2021   . COVID-19 Vaccine (5 - 2024-25 season) 02/17/2023   . Lipid Panel  04/04/2023   . Diabetes: Hemoglobin A1c  06/26/2023   . Metabolic Panel  12/24/2023   . Influenza Vaccine  Completed   . Zoster Vaccine  Completed   . Hepatitis C Screening  Completed   . Pneumococcal Vaccine: 65+ Years  Completed       REVIEW OF SYSTEMS     Denies hematuria, dysuria, foamy urine.  Has chronic LL edema, stable    PHYSICAL EXAM     BP 132/78 (BP Location: Right arm, Patient Position: Sitting)   Pulse 58   Ht 6' 1 (1.854 m)   Wt (!) 428 lb 8 oz (194.4 kg)   SpO2 95%   BMI 56.53 kg/m?     GEN: NAD  LUNGS: Clear  at BL bases  CV: RRR, S1 S2 heard, no murmur or rub  EXT: Significant LE Edema, RLE with superficial abrasion on shin, no erythema or warmth  NEURO: grossly normal, no focal findings    ASSESSMENT AND PLAN  1. Stage 3b chronic kidney disease (HCC)  Overview:  Status: Stable  Baseline Cr: 1.4-1.7>>2.1  Likely cause: HTN/DM  Imaging: Uptodate  Urine protein: ++  ACE/ARB: Taking  SGLT2 inh: NotTaking    Assessment & Plan:  Kidney function has trended higher due to being on an SGLT2 inhibitor, improving proteinuria echo findings were reviewed with him, noted worsening diastolic dysfunction, this will be sent to his cardiologist, I have messaged my staff to do so.  He notes persistent exertional dyspnea though no overt chest pain.  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 and I wonder if some component of this is due to deconditioning  I did advise that he can take an extra Lasix if he notes more swelling than usual  His hemoglobin trended down and I would have him repeat in 2 weeks though he does not note any overt signs of bleeding  Flu shot administered at his request, he notes no prior allergic reactions to the same  Follow-up in 3 months    Orders:  -     Hemoglobin -Routine; Future; Expected date: 04/04/2023  -     25-Hydroxyvitamin D, LC/MS/MS -Routine; Future; Expected date: 07/19/2023  -     Hemoglobin -Routine; Future; Expected date: 07/19/2023  -     PTH Hormone Intact -Routine; Future; Expected date: 07/19/2023  -     Renal Function Panel -Routine; Future; Expected date: 07/19/2023  -     Total Protein/Creatinine Ratio, Urine, Random -Routine; Future; Expected date: 07/19/2023  -     Urinalysis with microscopic Only - No Reflex Culture -Routine; Future; Expected date: 07/19/2023    2. Primary hypertension  Overview:  Acceptable control at home    Assessment & Plan:  Continue current medications      Other orders  -     Influenza High Dose (65 +)          This dictation was produced  using voice recognition software. Despite concurrent proofreading, please note transcription errors may be present and that errors may not reflect intent.   Any portions copied from prior notes have been reviewed, repeated or edited to match the patients current presentation or left intact as applicable.    Electronically signed by:  Regino Bellow,  03/21/2023   4:41 PM

## 2023-03-26 NOTE — Telephone Encounter (Signed)
Please see message below and send response once rec'd. Thank you    ECHO completed at [facility] 01/30/2023, results available in Care Everywhere.

## 2023-03-26 NOTE — Telephone Encounter (Signed)
-----   Message from [facility], MD sent at 03/21/2023  4:36 PM PDT -----  Can we make sure his echo is sent to his cardiologist, please have their office confirm receipt

## 2023-04-23 NOTE — Assessment & Plan Note (Addendum)
Uncontrolled with CO2 15. As he has had loose stools since starting the Farxiga we will see how he does with Jardiance.

## 2023-04-23 NOTE — Assessment & Plan Note (Signed)
Undetermined control. Will check a PTH prior to the next visit.

## 2023-04-23 NOTE — Assessment & Plan Note (Signed)
Reasonable control with minor anemia of 12.6g/dl in a patient with some anemia of renal disease.

## 2023-04-23 NOTE — Assessment & Plan Note (Signed)
Uncontrolled. Education provided on his hyperkalemia with most recent level of 5.4. It will be rechecked.

## 2023-06-17 NOTE — Telephone Encounter (Signed)
Pt called back to r/s 2/4 appointment that was canceled due to template change. Patient appt moved to 3/19. Mailed patient lab orders.

## 2023-06-18 NOTE — Telephone Encounter (Signed)
Would put him on a cancellation list, I would like to see him sometime in January

## 2023-06-19 IMAGING — DX DG FOOT COMPLETE 3+V*R*
3 series · 3 of 3 positions shown · non-contrast
Comparison: None.

CLINICAL DATA: Right foot bunion.

EXAM:
RIGHT FOOT COMPLETE - 3+ VIEW

[foot ap]
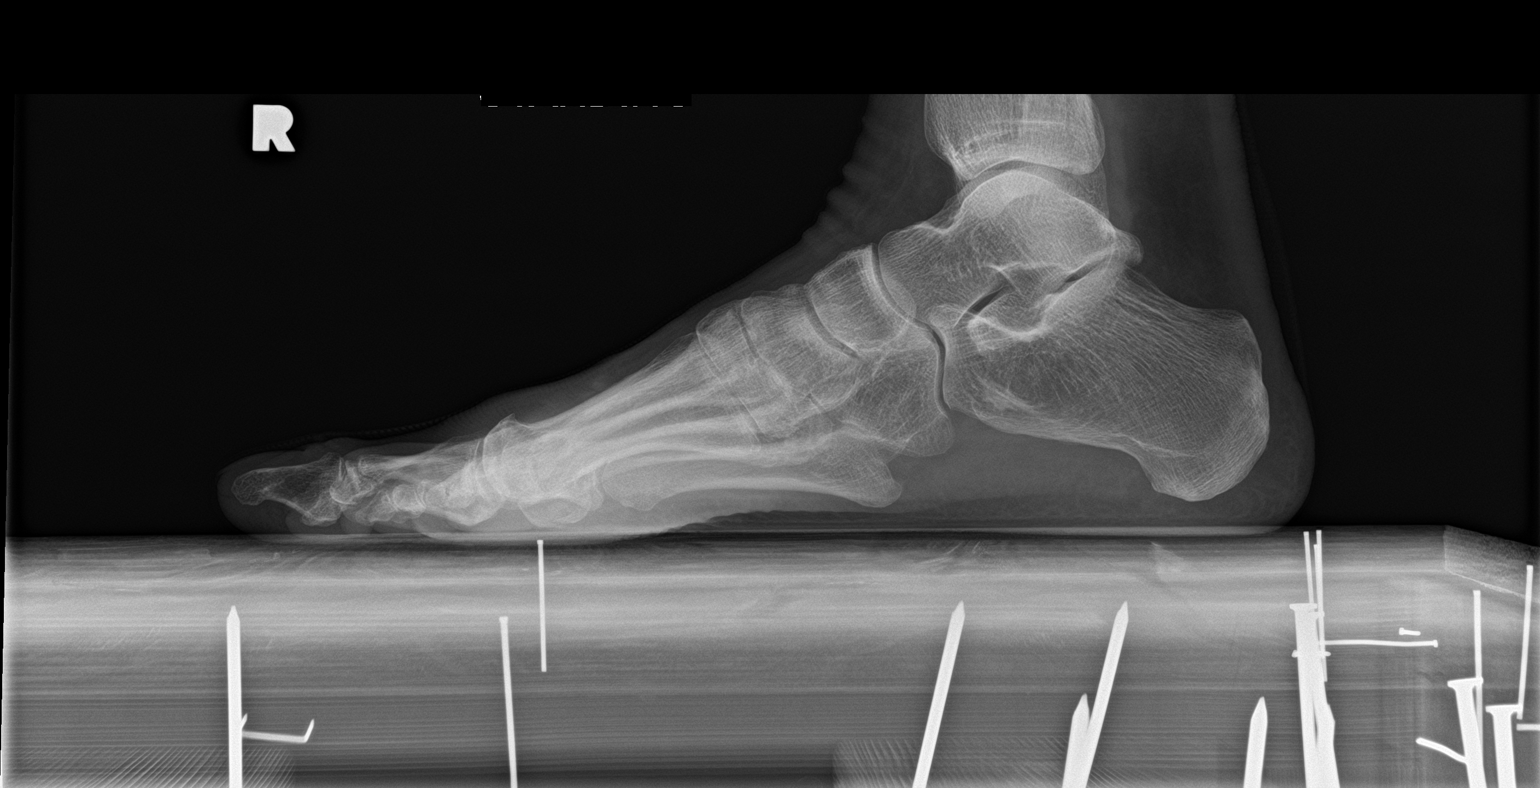

[foot ap wb]
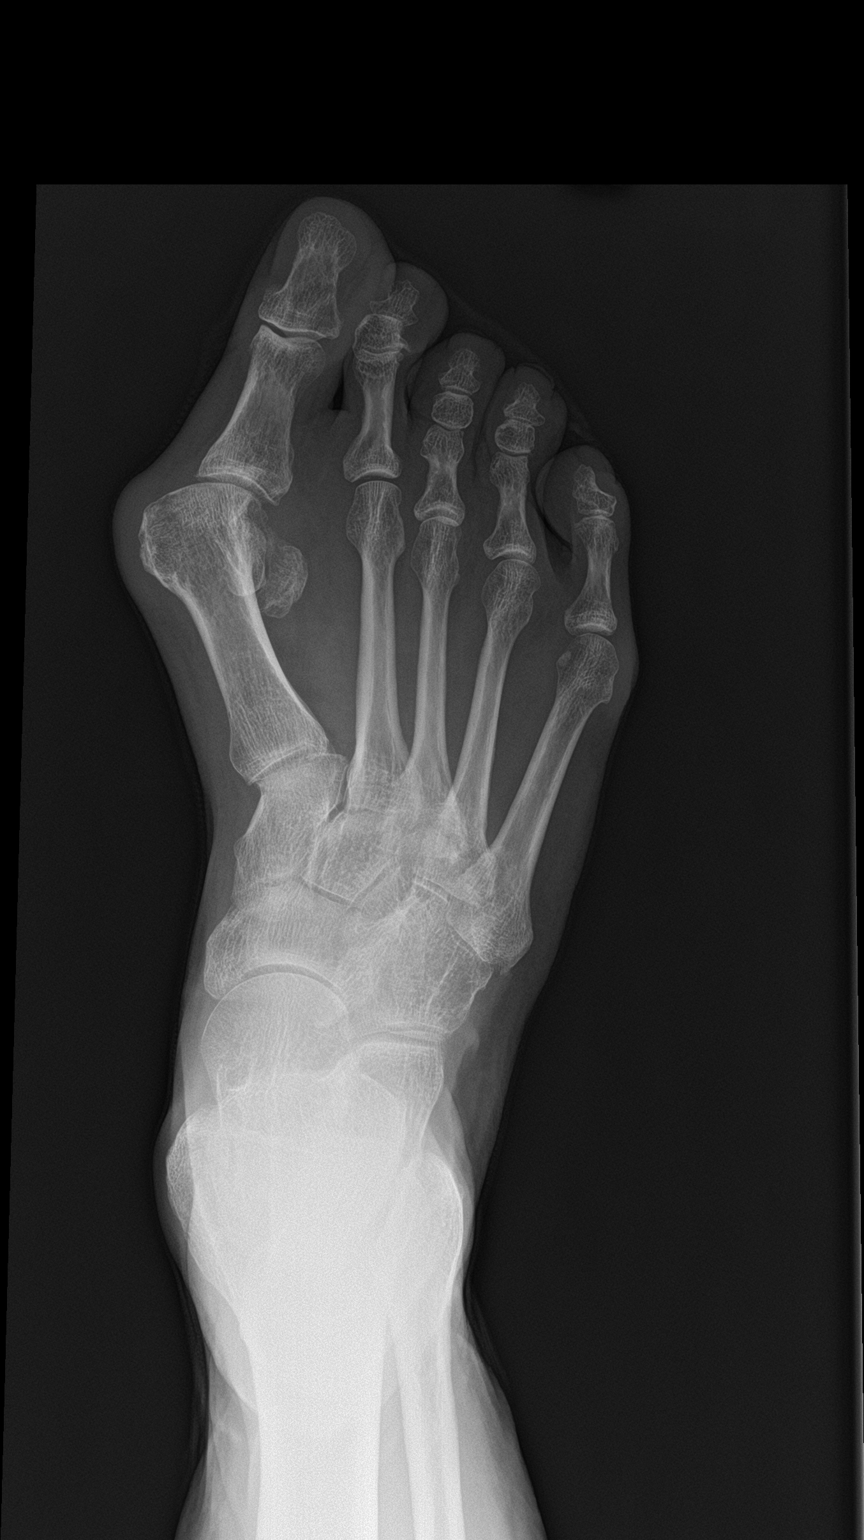

[foot obl wb]
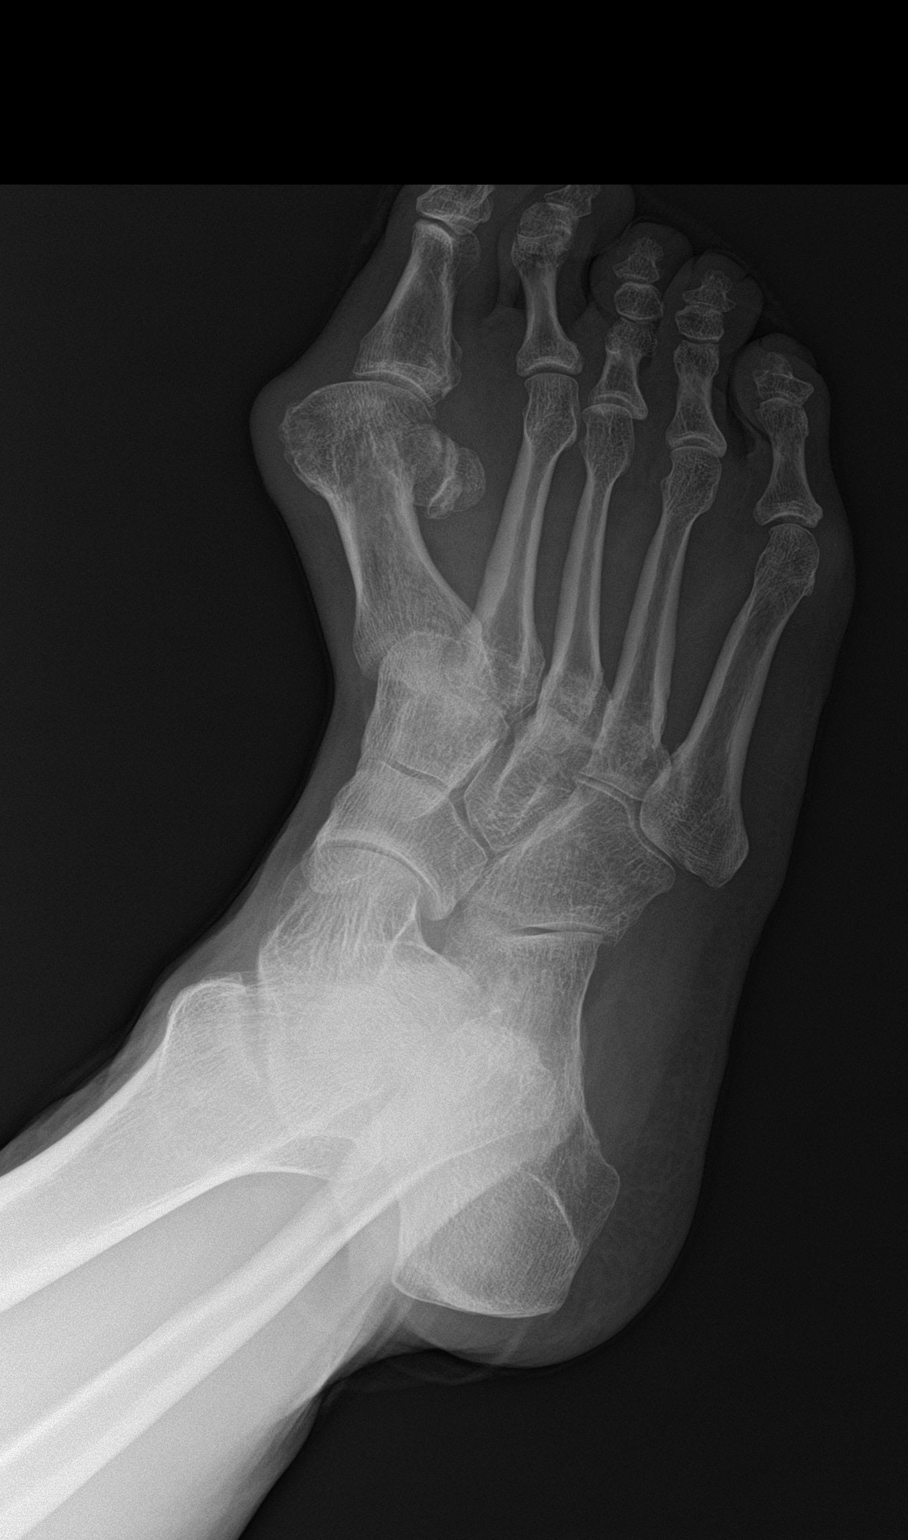

[3 of 3 positions shown; findings below may reference images not displayed]

FINDINGS: Moderate to high-grade hallux valgus. Moderate great toe
metatarsophalangeal joint space narrowing with mild peripheral
degenerative osteophytosis. Mild joint space narrowing of the
interphalangeal joints diffusely. No acute fracture or dislocation.
IMPRESSION: Moderate to high-grade hallux valgus and moderate great toe
metatarsophalangeal osteoarthritis.

## 2023-06-20 NOTE — Telephone Encounter (Signed)
Patient added to wait list for a possible January appointment.

## 2023-06-24 NOTE — Telephone Encounter (Signed)
Patient Scheduled 1/13 with Verna Czech. Faxing lab orders to [facility].

## 2023-06-24 NOTE — Telephone Encounter (Signed)
Yes NP is ok

## 2023-06-24 NOTE — Progress Notes (Signed)
 HPI: FU syncope. Previously followed by Dr. Vinie Hugh with [facility]. Stress echocardiogram November 2022 showed normal LV function and ischemia in the inferior/inferolateral walls by report.  Cardiac catheterization December 2022 showed 30% mid LAD and otherwise minor irregularities.  CTA March 2023 showed 50% or less stenosis in the right and left proximal internal carotid arteries.  Carotid Dopplers May 2023 technically difficult; there was suggestion of significant stenosis in the left internal carotid artery but report states I would rely more on findings from recent CTA. Echocardiogram June 2023 showed normal LV function, grade 1 diastolic dysfunction, trace aortic insufficiency.  Monitor July 2023 showed sinus rhythm with PACs, brief PAT, PVCs, 4 and 6 beat runs of nonsustained ventricular tachycardia. Patient has had difficulties with orthostatic hypotension and syncope. Since last seen, his blood pressure is running high at home.  It is in the 150-165 range systolic.  His dizziness is much improved.  He denies dyspnea or chest pain and no syncope.  Current Outpatient Medications  Medication Sig Dispense Refill   ascorbic acid (VITAMIN C) 250 MG tablet Take by mouth.     aspirin EC 81 MG tablet Take 81 mg by mouth daily. Swallow whole.     atorvastatin (LIPITOR) 80 MG tablet Take 80 mg by mouth daily.     Coenzyme Q10 50 MG CAPS Take by mouth.     Cyanocobalamin (VITAMIN B 12 PO) Take by mouth.     Magnesium 250 MG TABS Take by mouth.     Omega-3 Fatty Acids (FISH OIL) 1000 MG CAPS Take by mouth.     omeprazole (PRILOSEC) 20 MG capsule Take 20 mg by mouth daily.     valsartan  (DIOVAN ) 160 MG tablet Take 1 tablet (160 mg total) by mouth daily. 90 tablet 3   No current facility-administered medications for this visit.     Past Medical History:  Diagnosis Date   CAD (coronary artery disease)    COPD (chronic obstructive pulmonary disease) (HCC) 09/07/2021   GERD  (gastroesophageal reflux disease)    Heart murmur    Hiatal hernia 07/16/2021   Hyperlipidemia    Hypertension     Past Surgical History:  Procedure Laterality Date   cardiac catherization     CHOLECYSTECTOMY     HERNIA REPAIR      Social History   Socioeconomic History   Marital status: Divorced    Spouse name: Not on file   Number of children: 0   Years of education: Not on file   Highest education level: Some college, no degree  Occupational History   Occupation: Retired  Tobacco Use   Smoking status: Former    Current packs/day: 0.00    Average packs/day: 0.3 packs/day for 15.0 years (3.8 ttl pk-yrs)    Types: Cigarettes, Pipe    Start date: 07/07/1984    Quit date: 07/08/1999    Years since quitting: 23.9    Passive exposure: Past   Smokeless tobacco: Never   Tobacco comments:    None  Vaping Use   Vaping status: Never Used  Substance and Sexual Activity   Alcohol use: Yes    Alcohol/week: 5.0 standard drinks of alcohol    Types: 5 Shots of liquor per week    Comment: Occasional   Drug use: Never   Sexual activity: Not Currently  Other Topics Concern   Not on file  Social History Narrative   Not on file   Social Drivers of Health  Financial Resource Strain: Low Risk  (12/18/2022)   Received from [facility]   Overall Financial Resource Strain (CARDIA)    Difficulty of Paying Living Expenses: Not hard at all  Food Insecurity: No Food Insecurity (02/17/2023)   Hunger Vital Sign    Worried About Running Out of Food in the Last Year: Never true    Ran Out of Food in the Last Year: Never true  Transportation Needs: No Transportation Needs (02/17/2023)   PRAPARE - Administrator, Civil Service (Medical): No    Lack of Transportation (Non-Medical): No  Physical Activity: Insufficiently Active (02/17/2023)   Exercise Vital Sign    Days of Exercise per Week: 2 days    Minutes of Exercise per Session: 10 min  Stress: No Stress Concern Present  (12/18/2022)   Received from [facility] Occupational [facility] - Occupational Stress Questionnaire    Feeling of Stress : Not at all  Social Connections: Unknown (02/17/2023)   Social Connection and Isolation Panel [NHANES]    Frequency of Communication with Friends and Family: Patient declined    Frequency of Social Gatherings with Friends and Family: Once a week    Attends Religious Services: More than 4 times per year    Active Member of [employer] or Organizations: Yes    Attends Banker Meetings: 1 to 4 times per year    Marital Status: Divorced  Intimate Partner Violence: Not At Risk (12/18/2022)   Received from [facility]   HITS    Over the last 12 months how often did your partner physically hurt you?: Never    Over the last 12 months how often did your partner insult you or talk down to you?: Never    Over the last 12 months how often did your partner threaten you with physical harm?: Never    Over the last 12 months how often did your partner scream or curse at you?: Never    Family History  Problem Relation Age of Onset   Stroke Mother    Heart attack Father    Early death Brother     ROS: no fevers or chills, productive cough, hemoptysis, dysphasia, odynophagia, melena, hematochezia, dysuria, hematuria, rash, seizure activity, orthopnea, PND, pedal edema, claudication. Remaining systems are negative.  Physical Exam: Well-developed well-nourished in no acute distress.  Skin is warm and dry.  HEENT is normal.  Neck is supple.  Chest is clear to auscultation with normal expansion.  Cardiovascular exam is regular rate and rhythm.  Abdominal exam nontender or distended. No masses palpated. Extremities show no edema. neuro grossly intact   A/P  1 history of syncope-no recurrences since last office visit.  Follow-up echocardiogram showed preserved LV function and previous episode was felt likely to be orthostasis.  His HCTZ was discontinued  and his beta-blocker was discontinued due to mild bradycardia.  Symptoms are much improved.  2 hypertension-we are allowing his blood pressure to run higher given his orthostatic symptoms.  However he states his systolic is in the 150-160 range at home.  He is taking his valsartan  and 5 mg of amlodipine  daily.  I will increase amlodipine  to 7-1/2 mg daily and follow blood pressure.  We are trying to balance keeping his blood pressure reasonable without causing dizziness.  3 hyperlipidemia-continue statin.  4 history of minimal coronary artery disease at time of prior catheterization-continue statin.  He is not having chest pain.  5 history of carotid artery  disease-less than 50% bilaterally on previous CTA.  Continue statin.  6 preoperative evaluation prior to ventral hernia repair-previous catheterization revealed no significant obstructive coronary disease and LV function is normal.  He may proceed without further cardiac evaluation as he is not having exertional chest pain.  Redell Shallow, MD

## 2023-06-26 ENCOUNTER — Ambulatory Visit: Payer: Medicare HMO | Admitting: Cardiology

## 2023-06-26 ENCOUNTER — Encounter: Payer: Self-pay | Admitting: Cardiology

## 2023-06-26 VITALS — BP 140/62 | HR 56 | Ht 70.0 in | Wt 163.8 lb

## 2023-06-26 DIAGNOSIS — I2510 Atherosclerotic heart disease of native coronary artery without angina pectoris: Secondary | ICD-10-CM | POA: Diagnosis not present

## 2023-06-26 DIAGNOSIS — E785 Hyperlipidemia, unspecified: Secondary | ICD-10-CM | POA: Diagnosis not present

## 2023-06-26 DIAGNOSIS — I10 Essential (primary) hypertension: Secondary | ICD-10-CM

## 2023-06-26 DIAGNOSIS — Z87898 Personal history of other specified conditions: Secondary | ICD-10-CM | POA: Diagnosis not present

## 2023-06-26 DIAGNOSIS — Z01810 Encounter for preprocedural cardiovascular examination: Secondary | ICD-10-CM

## 2023-06-26 MED ORDER — AMLODIPINE BESYLATE 2.5 MG PO TABS: 2.5000 mg | ORAL_TABLET | Freq: Every day | ORAL | Status: DC

## 2023-06-26 MED ORDER — AMLODIPINE BESYLATE 5 MG PO TABS: 5.0000 mg | ORAL_TABLET | Freq: Every day | ORAL | Status: DC

## 2023-06-26 NOTE — Patient Instructions (Signed)
 Medication Instructions:   INCREASE AMLODIPINE  TO 7.5 MG ONCE DAILY= 1 OF THE 5 MG TABLETS AND 1 OF THE 2.5 MG TABLETS ONCE DAILY  *If you need a refill on your cardiac medications before your next appointment, please call your pharmacy*   Follow-Up: At [facility], you and your health needs are our priority.  As part of our continuing mission to provide you with exceptional heart care, we have created designated Provider Care Teams.  These Care Teams include your primary Cardiologist (physician) and Advanced Practice Providers (APPs -  Physician Assistants and Nurse Practitioners) who all work together to provide you with the care you need, when you need it.    Your next appointment:   6 month(s)  Provider:   Redell Shallow, MD

## 2023-07-01 ENCOUNTER — Ambulatory Visit: Admit: 2023-07-01 | Discharge: 2023-07-01 | Payer: MEDICARE | Attending: NP

## 2023-07-01 VITALS — BP 144/68 | HR 62 | Wt >= 6400 oz

## 2023-07-01 DIAGNOSIS — N1832 Chronic kidney disease, stage 3b (HCC): Secondary | ICD-10-CM

## 2023-07-01 NOTE — Assessment & Plan Note (Addendum)
Controlled. Minor anemia with a hemoglobin of 10.8 g/dl. Education provided on the different types of anemia and on ESAs which he does not currently require.

## 2023-07-01 NOTE — Assessment & Plan Note (Signed)
Controlled. Minor anemia with a hemoglobin of 10.8 g/dl. We will check irons prior to the next visit.

## 2023-07-01 NOTE — Assessment & Plan Note (Addendum)
Stable creatinine of 2.12 mg/dl which is at the upper end of his baseline range.   Urine p/c ratio  0.5g, from the prior 0.75g in September 2024. On olmesartan and empagliflozin (Jardiance)  Blood pressure is controlled.  Continue to avoid oral nonteroidals.   Return in about 4 months (around 10/29/2023). with lab prior.

## 2023-07-01 NOTE — Assessment & Plan Note (Signed)
Controlled with last PTH 64.

## 2023-07-01 NOTE — Assessment & Plan Note (Addendum)
Controlled potassium  of 5.0 mmol/L. On olmesartan. On furosemide.

## 2023-07-01 NOTE — Assessment & Plan Note (Signed)
 Controlled with a bicarbonate level of 20.

## 2023-07-01 NOTE — Progress Notes (Signed)
[facility]  Follow-up Note    Evan [name]  03-Jan-1946    HISTORY OF PRESENT ILLNESS:     Summary when last seen in the renal clinic:  03/21/2023 Wyman Songster, MD  Kidney function has trended higher due to being on an SGLT2 inhibitor, improving proteinuria echo findings were reviewed with him, noted worsening diastolic dysfunction, this will be sent to his cardiologist, I have messaged my staff to do so.  He notes persistent exertional dyspnea though no overt chest pain.  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  Mrs Behning is present for the visit. She states he is often sedentary which we disks those risks today.  Hospitalized 04/08/2023 - 04/17/2023 with anemia of acute blood loss.  Creatinine on presentation was 2.21 and hemoglobin was 7.3 g/dL.  He denies recent chest pain or lightheadedness.  He is hydrating well.  Home blood pressures 140-150s BEFORE pills and 115s-130s AFTER the pills take effect.  He states his chronic LE edema is improved.    Past Medical History:   Diagnosis Date    COPD (chronic obstructive pulmonary disease) (HCC)     Diabetes mellitus, type II (HCC)     Hypercholesteremia     Hypertension     Obesity, morbid (HCC)        Current Outpatient Medications on File Prior to Visit   Medication Sig Dispense Refill    amLODIPine (NORVASC) 2.5 MG tablet Take 5 mg by mouth once daily.      aspirin 81 MG EC tablet 81 mg.      atorvastatin (LIPITOR) 40 mg tablet Take 40 mg by mouth once  daily.      cholecalciferol (VITAMIN D3) 25 mcg (1,000 unit) tablet Take 1 tablet by mouth once daily.      custom medication every night at bedtime. Med Name: cpap      cyanocobalamin (VITAMIN B-12) 1000 MCG tablet once daily.      DULoxetine (CYMBALTA) 20 mg capsule Take 40 mg by mouth once daily.      ferrous sulfate 324 mg (65 mg Fe) EC tablet Take 324 mg by mouth daily with breakfast.      fluticasone-umeclidinium-vilanterol (TRELEGY ELLIPTA) 200-62.5-25 mcg inhaler Inhale 1 puff into the lungs once daily.      furosemide (LASIX) 20 mg tablet Take 1 tablet by mouth once daily. May take additional 20 mg tablet (total 40 mg ) as needed for weight gain 3 lb or edema 120 tablet 4    gabapentin (NEURONTIN) 300 mg capsule Take 300 mg by mouth 3 times daily. 1am, 1 noon, 2 night      hydroxychloroquine (PLAQUENIL) 200 mg tablet Take 200 mg by mouth 2 times daily.      insulin glargine U-300 (TOUJEO MAX U-300 SOLOSTAR) 300 unit/mL (3 mL) SubQ injection pen Inject under the skin every night at bedtime. Sliding scale 40-75 units  metoprolol tartrate (LOPRESSOR) 50 MG tablet Take 25 mg by mouth once daily.      multivit-min/ferrous fumarate (MULTI VITAMIN ORAL) Take 1 tablet by mouth once daily.      olmesartan (BENICAR) 40 mg tablet Take 20 mg by mouth once daily. Unknown dosage      pantoprazole (PROTONIX) 40 mg tablet Take 40 mg by mouth once daily.      sodium bicarbonate 650 mg tablet 3 tablets am, 2 tablet pm 150 tablet 6    testosterone (ANDROGEL) 12.5 mg/ 1.25 gram (1 %) GlPm Apply 4 Act topically Daily.      traMADol (ULTRAM) 50 mg tablet Take 50 mg by mouth 2 times daily.      triamcinolone (KENALOG) 0.1 % cream Apply  topically 2 times daily.      albuterol (PROAIR HFA) 90 mcg/actuation inhaler INHALE ONE PUFF BY MOUTH EVERY 4 HOURS AS NEEDED (Patient not taking: Reported on 07/01/2023)      ammonium lactate (LAC-HYDRIN) 12 % lotion Apply 12 g topically as needed for Dry skin.      diclofenac (VOLTAREN  ARTHRITIS PAIN) 1 % topical gel Apply topically daily as needed. Effected area (Patient not taking: Reported on 07/01/2023)      docusate sodium (COLACE) 100 mg capsule Take 100 mg by mouth once daily. (Patient not taking: Reported on 07/01/2023)      empagliflozin (JARDIANCE) 10 mg tablet Take 1 tablet by mouth once daily. (Patient not taking: Reported on 07/01/2023) 30 tablet 2    HYDROcodone-acetaminophen (NORCO) 10-325 mg tablet Take 1 tablet by mouth once daily. (Patient not taking: Reported on 01/08/2023)      insulin aspart (NOVOLOG) 100 unit/mL PEN Inject 22 Units under the skin 3 times daily with meals. (Patient not taking: Reported on 07/01/2023) 1 pen 0     No current facility-administered medications on file prior to visit.       Allergies   Allergen Reactions    Lisinopril Anaphylaxis    Neomycin-Polymyxin B Gu Unspecified/Patient unsure    Sulfa (Sulfonamide Antibiotics) Other (See Comments)     Extrasystole      Levofloxacin Rash     Maculopapular rash    Neomycin Rash         REVIEW OF SYSTEMS     NEGATIVE OR AS ADDRESSED ABOVE    PHYSICAL EXAM     BP 144/68   Pulse 62   Wt (!) 404 lb (183.3 kg)   SpO2 93%   BMI 53.30 kg/m?     Physical Exam   General: No acute distress. In a manual w/c   Resp: b/l symmetrical chest rise, no increased work of breathing. Lung sounds are clear.  Cardiac: RRR  Periph Vasc:  3+ LE edema b/l with orange peel texture but without weeping or visible ulcerations  Abdomen: Soft  Neuro: Alert and oriented   Psych: Mood and affect wnl    Previous labs (adapted from prior providers):  06/27/2023   BUN 43, creatinine 2.12, estimated GFR 31, sodium 140, potassium 5.0, bicarbonate 20, calcium 9.0, phosphorus 4.4, albumin 3.5, total vitamin D 45.5, PTH 64, hemoglobin 10.8, urine protein/creatinine ratio 0.5 g,     06/26/2023  Transferrin saturation 22%, ferritin 92, vitamin B12 722,    04/13/2023  BUN 25, creatinine 1.89, estimated GFR 36, hemoglobin 9.2,    04/12/2023  BUN 22, creatinine  1.76, estimated GFR 39    02/2023 Cr 2.16, GFR 31, UPCR 0.7, Hb 11.7  02/04/23  BUN 66, creatinine 2.13, estimated GFR 31, sodium 143, potassium 5.4, bicarbonate 15, calcium 9.2, phosphorus 5.4, albumin 3.6, transferrin saturation 34%, total vitamin D 41,  Hemoglobin 12.6,   UA with hyaline cats, bacteria few.  Urine protein/creatinine ratio 1.0 g.  Albumin/creatinine ratio 452, ferritin 162, PTH 88  11/2022 creatinine 1.8, GFR 36, potassium 4.8, bicarb 17, UAwith 2+ protein, vitamin D 32, UPCR 2 g, A1c 6, PTH 98  09/2022 Hb 11  05/2022 Cr 1.73, GFR 40,   04/2022 Cr 1.7, GFR 41, Bic 20, K 5.3, UPCR 1.47  01/17/22  BUN 44 creatinine 1.74 GFR 48 sodium 143 potassium 4.5 bicarbonate 21 glucose 310 albumin 3.2 calcium 8.5 phosphorus 3.6 PTH 145  Hemoglobin 12.5  Microalbumin to creatinine ratio 692  A1c 6.5%  07/07/21 Cr 1.84, GFR 38, K 5.7, Glu 284,  Hb 11.5, UA w neg pro, blood, 0-2W, R, UPCR 0.5  12/08/19 sodium 145 potassium 4.1 chloride 112 carbon dioxide 19 anion gap 14 glucose 149 BUN 35 creatinine 1.68 eGFR 40 cc/min calcium 8.8 phosphorus 4.1 albumin 3.26, UACR 220 ug/mg  03/27/19 Hemoglobin 12.5 Sodium 142 Potassium 4.3 Chloride 109 Carbon dioxide 21 Anion gap 12 Glucose 150 BUN 38 Creatinine 1.58 eGFR 43 cc/min Calcium 9.1 Phosphorus 4.5 Albumin 3.8 UACR 107 ug/mg  08/07/18 BUN 39 creatinine 1.63 eGFR 42 cc/m  04/03/18  BUN 35 creatinine 1.51 eGFR 46 cc/min Urine protein 11 Urine creatinine 29 UPC 0.4 g/day 25-OH Vitamin D 29.2  11/14/17 BUN 35 creatinine 1.51 eGFR 46 cc/min  04/24/17 creatinine 1.67  12/31/16 creatinine 1.75  11/22/16 creatinine 1.46  11/20/16 creatinine 1.41  11/08/16 creatinine 1.40  08/20/16 creatinine 1.69  12/01/15 creatinine 1.37  11/08/14 creatinine 1.21  04/16/14 creatinine 1.40  11/05/13 creatinine 1.10    ASSESSMENT AND PLAN     1. Stage 3b chronic kidney disease (HCC)  Overview:  Status: Stable  Baseline Cr: 1.4-1.7>>2.1  Likely cause: HTN/DM  Imaging: Uptodate  Urine protein: ++  ACE/ARB: Taking  SGLT2  inh: NotTaking    Assessment & Plan:  Stable creatinine of 2.12 mg/dl which is at the upper end of his baseline range.   Urine p/c ratio  0.5g, from the prior 0.75g in September 2024. On olmesartan and empagliflozin (Jardiance)  Blood pressure is controlled.  Continue to avoid oral nonteroidals.   Return in about 4 months (around 10/29/2023). with lab prior.       Orders:  -     Referral to [facility]  -     Renal Function Panel -Routine; Future; Expected date: 10/28/2023  -     CBC with Auto Differential -Routine; Future; Expected date: 10/28/2023  -     Ferritin -Routine; Future; Expected date: 10/28/2023  -     Iron & Total Iron Binding Capacity -Routine; Future; Expected date: 10/28/2023  -     25-OH Vitamin D Total D2+D3 -Routine; Future; Expected date: 10/28/2023  -     PTH Hormone Intact -Routine; Future; Expected date: 10/28/2023  -     Urinalysis with microscopic -Routine; Future; Expected date: 10/28/2023  -     Total Protein/Creatinine Ratio, Urine, Random -Routine; Future; Expected date: 10/28/2023    2. Hyperkalemia  Assessment & Plan:  Controlled potassium  of 5.0 mmol/L. On olmesartan. On furosemide.        3. Anemia of renal disease  Overview:  Also hx of GIB in 03/2023.      Assessment & Plan:  Controlled. Minor anemia with a hemoglobin of 10.8 g/dl. Education provided on the different types of anemia and on ESAs which he does not currently require.    Orders:  -     CBC with Auto Differential -Routine; Future; Expected date: 10/28/2023  -     Ferritin -Routine; Future; Expected date: 10/28/2023  -     Iron & Total Iron Binding Capacity -Routine; Future; Expected date: 10/28/2023    4. 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 Secondary hyperparathyroidism (HCC)  Assessment & Plan:  Controlled with last PTH 64.       Orders:  -     25-OH Vitamin D Total D2+D3 -Routine; Future; Expected date:  10/28/2023  -     PTH Hormone Intact -Routine; Future; Expected date: 10/28/2023    6. Metabolic acidosis  Assessment & Plan:   Controlled with a bicarbonate level of 20.       7. Iron deficiency anemia, unspecified iron deficiency anemia type  Overview:  Also hx of GIB in 03/2023.    Assessment & Plan:  Controlled. Minor anemia with a hemoglobin of 10.8 g/dl. We will check irons prior to the next visit.     Orders:  -     CBC with Auto Differential -Routine; Future; Expected date: 10/28/2023  -     Ferritin -Routine; Future; Expected date: 10/28/2023  -     Iron & Total Iron Binding Capacity -Routine; Future; Expected date: 10/28/2023         Return in about 4 months (around 10/29/2023).         I addressed medical care services that are part of ongoing care related to a patient's single, serious condition or a complex condition OR serve as the continuing focal point for all needed health care services.      This dictation was produced using voice recognition software. Despite concurrent proofreading, please note that homonyms and other transcription errors may be present and that these errors may not truly reflect my intent.     Phineas Real, NP

## 2023-07-12 NOTE — Telephone Encounter (Signed)
Previous phone conversation with IR on 10/05/2022.     Called patient to see about follow up for CT for S/P Cryoablation on 10/03/2022 with Dr. Sampson Si.     Patient stated that Dr. [name] ordered an MRI but he has no intentions on completing it.     He currently does is not established with Urology as Dr. [name] was with [facility] and they are no longer. He stated that he does not have any plans to establish with Urology provider any time soon and does not have an upcoming appointment.     Patient stated that he does not want any further imaging for the surveillance s/p Cryoablation.     No further imaging ordered.

## 2023-07-23 ENCOUNTER — Encounter: Payer: MEDICARE | Attending: Student in an Organized Health Care Education/Training Program

## 2023-08-21 ENCOUNTER — Other Ambulatory Visit (HOSPITAL_BASED_OUTPATIENT_CLINIC_OR_DEPARTMENT_OTHER): Payer: Self-pay | Admitting: *Deleted

## 2023-08-21 MED ORDER — AMLODIPINE BESYLATE 5 MG PO TABS: 5.0000 mg | ORAL_TABLET | Freq: Every day | ORAL | 2 refills | Status: AC

## 2023-08-21 MED ORDER — AMLODIPINE BESYLATE 2.5 MG PO TABS: 2.5000 mg | ORAL_TABLET | Freq: Every day | ORAL | 2 refills | Status: AC

## 2023-09-04 ENCOUNTER — Encounter: Payer: MEDICARE | Attending: Student in an Organized Health Care Education/Training Program

## 2023-11-01 ENCOUNTER — Ambulatory Visit
Admit: 2023-11-01 | Discharge: 2023-11-01 | Payer: MEDICARE | Attending: Student in an Organized Health Care Education/Training Program

## 2023-11-01 VITALS — BP 138/78 | HR 73 | Ht 73.0 in | Wt >= 6400 oz

## 2023-11-01 DIAGNOSIS — N1832 Chronic kidney disease, stage 3b (HCC): Secondary | ICD-10-CM

## 2023-11-01 MED ORDER — sodium bicarbonate 650 mg tablet
650 | ORAL_TABLET | Freq: Two times a day (BID) | ORAL | 3 refills | Status: DC
Start: 2023-11-01 — End: 2024-02-25

## 2023-11-01 NOTE — Assessment & Plan Note (Addendum)
 Overdue for follow-up, has not reestablished with Dr. Davol.  Please see note from 07/08/2023 by his family medicine MA  I did call the patient back to discuss the importance of this imaging, I left a voicemail and will continue to keep trying to get in touch with patient to ensure that this is followed-up

## 2023-11-01 NOTE — Assessment & Plan Note (Signed)
 ER precautions provided if he does notice any signs of infection, will repeat in 2 weeks.  Incidentally also noted 4 months back.

## 2023-11-01 NOTE — Assessment & Plan Note (Addendum)
 Stable renal function, electrolytes in normal range  No hematuria  Continues with proteinuria which is above target, as he is on Ozempic, we will watch for now and if it trends up then at that point we will consider uptitrating ARB  Acid base status acceptable, no need for meds at this time  Hb acceptable, no current EPO needs  Medications reviewed  All questions answered and relevant labwork discussed with pt to apparent satisfaction  Follow up in 4 months with pre-visit lab

## 2023-11-01 NOTE — Assessment & Plan Note (Signed)
 Will bump up sodium bicarb to 3 pills twice daily, refill sent

## 2023-11-01 NOTE — Patient Instructions (Signed)
 Do [facility] test [facility] in [facility]--here is the address   [address], [place], [place] [address]

## 2023-11-01 NOTE — Assessment & Plan Note (Signed)
 Continue current medications.

## 2023-11-01 NOTE — Progress Notes (Signed)
 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 Has not lost weight  He had an imaging in October 2024 of the renal cell cancer, an MRI was recommended and was even ordered by his prior urologist Dr. Erlinda Haws but patient has not done it, please see note from 07/08/2023 by his family medicine MA    HTN history  Diagnosed 2018  Takes amlodipine 5, olmesartan 20, metop 50bid, lasix  20  Pre meds BP logs: 120-140/60s range    DM history  T2DM  Diagnosed 2018  On insulin   No h/o retinopathy    Renal history  No prior h/o UTIs, leg ulcers/PVD  No recent NSAID/PPI use, COVID infection or stone  Remote h/o smoking    Previous labs (adapted from prior providers):  10/2023 creatinine 1.9, GFR 35, bicarb 19, potassium 4.7, iron saturation 24, vitamin D 44 hemoglobin 11.3, WBC 14.5, UA with 0-2 RBC, UPCR 0.7, PTH 56  06/2023 creatinine 2.1, GFR 31  02/2023 Cr 2.16, GFR 31, UPCR 0.7, Hb 11.7  02/04/23  BUN 66, creatinine 2.13, estimated GFR 31, sodium 143, potassium 5.4, bicarbonate 15, calcium 9.2, phosphorus 5.4, albumin 3.6, transferrin saturation 34%, total vitamin D 41,  Hemoglobin 12.6,   UA with hyaline cats, bacteria few.  Urine protein/creatinine ratio 1.0 g.  Albumin/creatinine ratio 452, ferritin 162, PTH 88  11/2022 creatinine 1.8, GFR 36, potassium 4.8, bicarb 17, UAwith 2+ protein, vitamin D 32, UPCR 2 g, A1c 6, PTH 98  09/2022 Hb 11  05/2022 Cr 1.73, GFR 40,   04/2022 Cr 1.7, GFR 41, Bic 20, K 5.3, UPCR 1.47  01/17/22  BUN 44 creatinine 1.74 GFR 48 sodium 143 potassium 4.5 bicarbonate 21  glucose 310 albumin 3.2 calcium 8.5 phosphorus 3.6 PTH 145  Hemoglobin 12.5  Microalbumin to creatinine ratio 692  A1c 6.5%  07/07/21 Cr 1.84, GFR 38, K 5.7, Glu 284,  Hb 11.5, UA w neg pro, blood, 0-2W, R, UPCR 0.5  12/08/19 sodium 145 potassium 4.1 chloride 112 carbon dioxide 19 anion gap 14 glucose 149 BUN 35 creatinine 1.68 eGFR 40 cc/min calcium 8.8 phosphorus 4.1 albumin 3.26, UACR 220 ug/mg  03/27/19 Hemoglobin 12.5 Sodium 142 Potassium 4.3 Chloride 109 Carbon dioxide 21 Anion gap 12 Glucose 150 BUN 38 Creatinine 1.58 eGFR 43 cc/min Calcium 9.1 Phosphorus 4.5 Albumin 3.8 UACR 107 ug/mg  08/07/18 BUN 39 creatinine 1.63 eGFR 42 cc/m  04/03/18  BUN 35 creatinine 1.51 eGFR 46 cc/min Urine protein 11 Urine creatinine 29 UPC 0.4 g/day 25-OH Vitamin D 29.2  11/14/17 BUN 35 creatinine 1.51 eGFR 46 cc/min  04/24/17 creatinine 1.67  12/31/16 creatinine 1.75  11/22/16 creatinine 1.46  11/20/16 creatinine 1.41  11/08/16 creatinine 1.40  08/20/16 creatinine 1.69  12/01/15 creatinine 1.37  11/08/14 creatinine 1.21  04/16/14 creatinine 1.40  11/05/13 creatinine 1.10    Imaging:  CT 03/2023  KIDNEYS/URETERS: 3 cm masslike area with adjacent fat necrosis seen in the posterior interpolar region of the left kidney. Cortical cyst  is seen in the superior pole of the left kidney. BLADDER : Normal.       TTE 01/2023  Mild left ventricular enlargement with borderline concentric hypertrophy  and normal systolic function LVEF 65%.    No regional wall motion abnormality.  Grade 2 diastolic dysfunction.  Right ventricular chamber dilatation with normal systolic function.  Visually biatrial enlargement without intra-atrial shunting.  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  Trace of mitral valve regurgitation.  Normal tricuspid and pulmonic valve structure and function.  Unable to assess for pulmonary pressure due to lack of  tricuspid valve  regurgitation.  No pericardial effusion         Past Medical History:  has a past medical history of COPD (chronic obstructive pulmonary disease) (HCC), Diabetes mellitus, type II (HCC), Hypercholesteremia, Hypertension, and Obesity, morbid (HCC).     Past Surgical History:  has no past surgical history on file.       Outpatient Medications as of 11/01/2023:   .  albuterol , INHALE ONE PUFF BY MOUTH EVERY 4 HOURS AS NEEDED  .  amLODIPine, 5 mg, Oral, Daily  .  aspirin , 81 mg.  .  atorvastatin , 40 mg, Oral, Daily  .  cholecalciferol , 1,000 units, Oral, Daily  .  custom medication, every night at bedtime. Med Name: cpap  .  cyanocobalamin, once daily. (Patient taking differently: 500 mcg, Oral, Daily)  .  diclofenac, Apply topically daily as needed. Effected area  .  docusate sodium , 100 mg, Oral, Daily (Patient taking differently: 100 mg, Oral, As needed)  .  DULoxetine, 40 mg, Oral, Daily  .  ferrous sulfate, 324 mg, Oral, Daily with breakfast  .  TRELEGY ELLIPTA, 1 puff, Inhalation, Daily  .  furosemide , 20 mg, Oral, Daily  .  gabapentin, 300 mg, Oral, TID (Patient taking differently: 300 mg, Oral, 3 times daily, 1am, 2 noon, 2 night)  .  HYDROcodone -acetaminophen , 1 tablet, Oral, Daily (Patient taking differently: 1 tablet, Oral, 2 times daily)  .  hydroxychloroquine, 200 mg, Oral, BID  .  insulin  glargine U-300, Inject under the skin every night at bedtime. Sliding scale 40-75 units  .  metoprolol  tartrate, 25 mg, Oral, Daily  .  multivit-min/ferrous fumarate (MULTI VITAMIN ORAL), 1 tablet, Oral, Daily  .  olmesartan, 20 mg, Oral, Daily  .  pantoprazole, 40 mg, Oral, Daily (Patient taking differently: 40 mg, Oral, 2 times daily)  .  testosterone, Apply 4 Act topically Daily.  .  traMADol, 50 mg, Oral, BID  .  triamcinolone, Apply  topically 2 times daily. (Patient taking differently: As needed)  .  insulin  aspart, 22 units, Subcutaneous, TID with meals (Patient not taking: Reported on 11/01/2023)  .   sodium bicarbonate , 1,950 mg, Oral, BID     Allergies: is allergic to lisinopril, neomycin-polymyxin b gu, sulfa (sulfonamide antibiotics), levofloxacin , and neomycin.     Family History: family history is not on file.     Social History:  reports that he has never smoked. He has never used smokeless tobacco. He reports that he does not drink alcohol  and does not use drugs.     Health Maintenance   Topic Date Due   . Annual Wellness Visit  Never done   . Diabetes: Foot Exam  Never done   . Diabetes: Retinopathy Screening  Never done   . RSV  Vaccine (1 - 1-dose 75+ series) Never done   . COVID-19 Vaccine (5 - 2024-25 season) 02/17/2023   . Tetanus Vaccine (2 - Td or Tdap) 07/03/2021   . Lipid Panel  04/04/2023   . Diabetes: Hemoglobin A1c  03/04/2024   . Metabolic Panel  10/27/2024   . Diabetes: Urine Protein Screening  10/27/2024   . Influenza Vaccine  Completed   . Zoster Vaccine  Completed   . Hepatitis C Screening  Completed   . Pneumococcal Vaccine: 50+ Years  Completed   . Meningococcal ACWY Vaccine  Aged Out   . Meningococcal B Vaccine  Aged Out   . Colorectal  Discontinued       REVIEW OF SYSTEMS     As above    PHYSICAL EXAM     BP 138/78 (BP Location: Right forearm, Patient Position: Sitting)   Pulse 73   Ht 6' 1 (1.854 m)   Wt (!) 412 lb 1.6 oz (186.9 kg)   SpO2 94%   BMI 54.37 kg/m?     GEN: NAD  LUNGS: Clear at BL bases  CV: RRR, S1 S2 heard, no murmur or rub  EXT: +lymphedema bilaterally  NEURO: grossly normal, no focal findings    ASSESSMENT AND PLAN       1. Stage 3b chronic kidney disease (HCC)  Overview:  Status: Stable  Baseline Cr: 1.4-2  Likely cause: HTN/DM  Imaging: Uptodate  Urine protein: ++  ACE/ARB: Taking  SGLT2 inh: NotTaking    Assessment & Plan:  Stable renal function, electrolytes in normal range  No hematuria  Continues with proteinuria which is above target, as he is on Ozempic, we will watch for now and if it trends up then at that point we will consider uptitrating ARB  Acid  base status acceptable, no need for meds at this time  Hb acceptable, no current EPO needs  Medications reviewed  All questions answered and relevant labwork discussed with pt to apparent satisfaction  Follow up in 4 months with pre-visit lab      Orders:  -     sodium bicarbonate  650 mg tablet; Take 3 tablets by mouth 2 times daily. 3 tablets am, 2 tablet pm  Dispense: 360 tablet; Refill: 3  -     25-Hydroxyvitamin D, LC/MS/MS -Routine; Future; Expected date: 02/29/2024  -     Hemoglobin -Routine; Future; Expected date: 02/29/2024  -     PTH Hormone Intact -Routine; Future; Expected date: 02/29/2024  -     Renal Function Panel -Routine; Future; Expected date: 02/29/2024  -     Total Protein/Creatinine Ratio, Urine, Random -Routine; Future; Expected date: 02/29/2024    2. Primary hypertension  Overview:  Acceptable control at home    Assessment & Plan:  Continue current medications      3. Leukocytosis, unspecified type  Overview:  Unclear cause, no infectious symptoms, not on steroids    Assessment & Plan:  ER precautions provided if he does notice any signs of infection, will repeat in 2 weeks.  Incidentally also noted 4 months back.    Orders:  -     CBC with Auto Differential -Routine; Future; Expected date: 11/15/2023    4. Metabolic acidosis  Overview:  Acceptable at this time    Assessment & Plan:  Will bump up sodium bicarb to 3 pills twice daily, refill sent      5. Renal cyst  Overview:  Underwent CT-guided cryoablation of left midpole renal  tumor 08/2022  Dr Erlinda Haws    Assessment & Plan:  Overdue for follow-up, has not reestablished with Dr. Erlinda Haws.  Please see note from 07/08/2023 by his family medicine MA  I did call the patient back to discuss the importance of this imaging, I left a voicemail and will continue to keep trying to get in touch with patient to ensure that this is followed-up            This dictation was produced using voice recognition software. Despite concurrent proofreading, please note  transcription errors may be present and that errors may not reflect intent.   Any portions copied from prior notes have been reviewed, repeated or edited to match the patients current presentation or left intact as applicable.    Electronically signed by:  Ladora Piedmont,  11/01/2023   3:40 PM

## 2023-11-05 NOTE — Telephone Encounter (Signed)
-----   Message from [facility] sent at 11/01/2023  3:39 PM PDT -----  Regarding: Voice message  Can you please call patient on Monday and see if he was able to follow-up with Dr. Erlinda Haws, was recommended for an MRI.  Please let me know

## 2023-11-05 NOTE — Telephone Encounter (Signed)
 Spoke to [employer], he hasn't reached out to Dr. Giovanna Lake office, advised to reach out. Explained importance of recommended MRI, voiced understanding. He said he will call sometime this week, wouldn't give me details on when.

## 2023-11-06 NOTE — Telephone Encounter (Signed)
 Patient called Dr.Davols office and they need a new referral to see the patient.

## 2023-11-07 NOTE — Telephone Encounter (Signed)
 Bre, can you let us  know once its approved. Thank you.

## 2023-11-07 NOTE — Addendum Note (Signed)
 Addended by: Orvel Blanco on: 11/07/2023 10:30 AM     Modules accepted: Orders

## 2023-11-07 NOTE — Telephone Encounter (Signed)
 Please place this as stat and let me know when it goes through

## 2023-11-12 NOTE — Telephone Encounter (Signed)
 Referral approved

## 2023-11-13 NOTE — Telephone Encounter (Signed)
 Evan [name] called Dr. Giovanna Lake office, he was advised they are not able to make him an appointment at this time. They will call him back within the next two weeks to schedule.

## 2023-11-13 NOTE — Telephone Encounter (Signed)
 Okay, he should let us  know when it gets scheduled for

## 2023-11-13 NOTE — Telephone Encounter (Signed)
 Spoke to [employer], he hasn't made an appointment yet. He is going to call today, he will let us  know when he is scheduled for.

## 2023-11-15 NOTE — Telephone Encounter (Signed)
 Got it, thanks for update

## 2023-11-15 NOTE — Telephone Encounter (Signed)
 Patient called and left a voice mail.  He wanted you to know he got an appointment with Dr Macario Savin June 26th at [facility]

## 2023-12-02 NOTE — Telephone Encounter (Signed)
 Letter mailed

## 2023-12-23 ENCOUNTER — Other Ambulatory Visit: Payer: Self-pay | Admitting: Cardiology

## 2023-12-26 ENCOUNTER — Other Ambulatory Visit: Payer: Self-pay | Admitting: Cardiology

## 2024-02-25 MED ORDER — sodium bicarbonate 650 mg tablet
650 | ORAL_TABLET | Freq: Two times a day (BID) | ORAL | 1 refills | Status: AC
Start: 2024-02-25 — End: ?

## 2024-02-25 NOTE — Telephone Encounter (Signed)
 RX e-scribed per protocol.

## 2024-03-03 NOTE — Progress Notes (Signed)
 HPI: FU syncope. Previously followed by Dr. Vinie Hugh with [facility]. Stress echocardiogram November 2022 showed normal LV function and ischemia in the inferior/inferolateral walls by report.  Cardiac catheterization December 2022 showed 30% mid LAD and otherwise minor irregularities.  CTA March 2023 showed 50% or less stenosis in the right and left proximal internal carotid arteries.  Carotid Dopplers May 2023 technically difficult; there was suggestion of significant stenosis in the left internal carotid artery but report states I would rely more on findings from recent CTA. Echocardiogram June 2023 showed normal LV function, grade 1 diastolic dysfunction, trace aortic insufficiency.  Monitor July 2023 showed sinus rhythm with PACs, brief PAT, PVCs, 4 and 6 beat runs of nonsustained ventricular tachycardia. Patient has had difficulties with orthostatic hypotension and syncope. Since last seen, patient denies dyspnea, chest pain or syncope.  Some dizziness with standing occasionally.  Current Outpatient Medications  Medication Sig Dispense Refill   amLODipine  (NORVASC ) 2.5 MG tablet Take 1 tablet (2.5 mg total) by mouth daily. Take with ( 5 mg) tablet for ( 7.5 mg)  daily. 90 tablet 2   amLODipine  (NORVASC ) 5 MG tablet Take 1 tablet (5 mg total) by mouth daily. Take with ( 2.5 mg) tablet for ( 7.5 mg)  daily. 90 tablet 2   ascorbic acid (VITAMIN C) 250 MG tablet Take by mouth.     aspirin EC 81 MG tablet Take 81 mg by mouth daily. Swallow whole.     atorvastatin (LIPITOR) 80 MG tablet Take 80 mg by mouth daily.     Coenzyme Q10 50 MG CAPS Take by mouth.     Cyanocobalamin (VITAMIN B 12 PO) Take by mouth.     Magnesium 250 MG TABS Take by mouth.     Omega-3 Fatty Acids (FISH OIL) 1000 MG CAPS Take by mouth.     omeprazole (PRILOSEC) 20 MG capsule Take 20 mg by mouth daily.     valsartan  (DIOVAN ) 160 MG tablet TAKE 1 TABLET BY MOUTH DAILY 90 tablet 1   No current facility-administered  medications for this visit.     Past Medical History:  Diagnosis Date   CAD (coronary artery disease)    COPD (chronic obstructive pulmonary disease) (HCC) 09/07/2021   GERD (gastroesophageal reflux disease)    Heart murmur    Hiatal hernia 07/16/2021   Hyperlipidemia    Hypertension     Past Surgical History:  Procedure Laterality Date   cardiac catherization     CHOLECYSTECTOMY     HERNIA REPAIR      Social History   Socioeconomic History   Marital status: Divorced    Spouse name: Not on file   Number of children: 0   Years of education: Not on file   Highest education level: Some college, no degree  Occupational History   Occupation: Retired  Tobacco Use   Smoking status: Former    Current packs/day: 0.00    Average packs/day: 0.3 packs/day for 15.0 years (3.8 ttl pk-yrs)    Types: Cigarettes, Pipe    Start date: 07/07/1984    Quit date: 07/08/1999    Years since quitting: 24.6    Passive exposure: Past   Smokeless tobacco: Never   Tobacco comments:    None  Vaping Use   Vaping status: Never Used  Substance and Sexual Activity   Alcohol use: Yes    Alcohol/week: 5.0 standard drinks of alcohol    Types: 5 Shots of liquor per week  Comment: Occasional   Drug use: Never   Sexual activity: Not Currently  Other Topics Concern   Not on file  Social History Narrative   Not on file   Social Drivers of Health   Financial Resource Strain: Low Risk  (10/08/2023)   Received from [facility]   Overall Financial Resource Strain (CARDIA)    Difficulty of Paying Living Expenses: Not hard at all  Food Insecurity: No Food Insecurity (12/10/2023)   Received from [facility]   Hunger Vital Sign    Within the past 12 months, you worried that your food would run out before you got the money to buy more.: Never true    Within the past 12 months, the food you bought just didn't last and you didn't have money to get more.: Never true  Transportation Needs: No  Transportation Needs (12/10/2023)   Received from [facility] - Transportation    Lack of Transportation (Medical): No    Lack of Transportation (Non-Medical): No  Physical Activity: Sufficiently Active (12/10/2023)   Received from [facility]   Exercise Vital Sign    On average, how many days per week do you engage in moderate to strenuous exercise (like a brisk walk)?: 5 days    On average, how many minutes do you engage in exercise at this level?: 30 min  Stress: No Stress Concern Present (12/10/2023)   Received from [facility] - Occupational Stress Questionnaire    Feeling of Stress : Not at all  Social Connections: Moderately Integrated (12/10/2023)   Received from [facility]   Social Network    How would you rate your social network (family, work, friends)?: Adequate participation with social networks  Intimate Partner Violence: Not At Risk (12/10/2023)   Received from [facility]   HITS    Over the last 12 months how often did your partner physically hurt you?: Never    Over the last 12 months how often did your partner insult you or talk down to you?: Never    Over the last 12 months how often did your partner threaten you with physical harm?: Never    Over the last 12 months how often did your partner scream or curse at you?: Never    Family History  Problem Relation Age of Onset   Stroke Mother    Heart attack Father    Early death Brother     ROS: no fevers or chills, productive cough, hemoptysis, dysphasia, odynophagia, melena, hematochezia, dysuria, hematuria, rash, seizure activity, orthopnea, PND, pedal edema, claudication. Remaining systems are negative.  Physical Exam: Well-developed well-nourished in no acute distress.  Skin is warm and dry.  HEENT is normal.  Neck is supple.  Chest is clear to auscultation with normal expansion.  Cardiovascular exam is regular rate and rhythm.  Abdominal exam  nontender or distended. No masses palpated. Extremities show no edema. neuro grossly intact  EKG Interpretation Date/Time:  Monday March 09 2024 11:14:23 EDT Ventricular Rate:  58 PR Interval:  146 QRS Duration:  102 QT Interval:  414 QTC Calculation: 406 R Axis:   -18  Text Interpretation: Sinus bradycardia Confirmed by Pietro Rogue ([id number]) on 03/09/2024 11:15:36 AM    A/P  1 history of syncope-previous echocardiogram showed preserved LV function and previous episode felt likely secondary to orthostasis.  Will continue to follow.  2 hypertension-as outlined in previous notes we are allowing blood pressure to run higher  given his history of orthostatic symptoms.  3 hyperlipidemia-continue statin.  4 history of carotid artery disease-less than 50% on prior CTA bilaterally.  Continue statin.  5 history of minimal coronary artery disease-continue statin.  He denies chest pain.  Redell Shallow, MD

## 2024-03-09 ENCOUNTER — Encounter: Payer: Self-pay | Admitting: Cardiology

## 2024-03-09 ENCOUNTER — Telehealth: Payer: Self-pay | Admitting: Cardiology

## 2024-03-09 ENCOUNTER — Ambulatory Visit: Admitting: Cardiology

## 2024-03-09 VITALS — BP 118/62 | HR 58 | Ht 70.0 in | Wt 165.0 lb

## 2024-03-09 DIAGNOSIS — E785 Hyperlipidemia, unspecified: Secondary | ICD-10-CM | POA: Diagnosis not present

## 2024-03-09 DIAGNOSIS — I10 Essential (primary) hypertension: Secondary | ICD-10-CM

## 2024-03-09 DIAGNOSIS — I2510 Atherosclerotic heart disease of native coronary artery without angina pectoris: Secondary | ICD-10-CM | POA: Diagnosis not present

## 2024-03-09 DIAGNOSIS — Z87898 Personal history of other specified conditions: Secondary | ICD-10-CM | POA: Diagnosis not present

## 2024-03-09 MED ORDER — VALSARTAN 80 MG PO TABS: 80.0000 mg | ORAL_TABLET | Freq: Every day | ORAL | 3 refills | Status: AC

## 2024-03-09 NOTE — Patient Instructions (Signed)

## 2024-03-09 NOTE — Telephone Encounter (Signed)
 Spoke with the patient who states that after his appointment today he went to run errands. He was putting groceries in his car when he got dizzy and lost his balance and fell. He did not lose consciousness. He states that he did not hit his head or injure himself. He is feeling fine now. Patient reports that he does drink water during the day but he didn't have any with him at the time and he did feel a bit thirsty. He reports that he had some cereal for breakfast this morning but did not have anything else. Patient states that he is concerned about his blood pressures being too low. He did bring readings in to Dr. Pietro this morning and he thought they were good. Patient states that they might be too low for him. Advised patient to ensure he is staying hydrated and eating regular meals. Encouraged him to change positions slowly. Will send message to Dr. Pietro for further advisement.

## 2024-03-09 NOTE — Telephone Encounter (Signed)
Spoke with pt, Aware of dr crenshaw's recommendations. New script sent to the pharmacy  

## 2024-03-09 NOTE — Telephone Encounter (Signed)
 Pt went shopping after his appt today and had a fall in the parking lot. He was very dizzy before falling. He would like a callback in regards to his current medication and if this is related to these frequent dizzy spells. Please advise.    STAT if patient feels like he/she is going to faint   Are you dizzy, lightheaded, or faint now? yes  Have you passed out? No IF YES MOVE TO .SYNCOPECVD  Do you have any other symptoms? No  Have you checked your HR and BP (record if available)? No

## 2024-03-11 ENCOUNTER — Ambulatory Visit: Admit: 2024-03-11 | Discharge: 2024-03-11 | Payer: MEDICARE | Attending: NP

## 2024-03-11 VITALS — BP 138/70 | HR 77

## 2024-03-11 DIAGNOSIS — N1832 Chronic kidney disease, stage 3b: Principal | ICD-10-CM

## 2024-03-11 NOTE — Assessment & Plan Note (Signed)
 Controlled with a bicarbonate level of 21. On supplementation

## 2024-03-11 NOTE — Assessment & Plan Note (Signed)
 Controlled. Minor anemia with a hemoglobin of 11.5 g/dl. On ferrous sulfate. Education provided on ESAs which he does not yet require and will not unless he is consistently under 10g/dl.

## 2024-03-11 NOTE — Assessment & Plan Note (Signed)
Blood pressure is controlled at home.

## 2024-03-11 NOTE — Assessment & Plan Note (Signed)
 Controlled with last PTH  66.

## 2024-03-11 NOTE — Assessment & Plan Note (Signed)
 Controlled potassium  of 4.7 mmol/L. On olmesartan. On furosemide .

## 2024-03-11 NOTE — Progress Notes (Cosign Needed Addendum)
 [facility]  Follow-up Note    Evan [name]  [birth year]    HISTORY OF PRESENT ILLNESS:     Summary when last seen in the renal clinic:  11/01/2023 Evan Providence Bathe, MD  Stable renal function, electrolytes in normal range  No hematuria  Continues with proteinuria which is above target, as he is on Ozempic, we will watch for now and if it trends up then at that point we will consider uptitrating ARB  Acid base status acceptable, no need for meds at this time  Hb acceptable, no current EPO needs  Medications reviewed  All questions answered and relevant labwork discussed with pt to apparent satisfaction  Follow up in 4 months with pre-visit lab    Subjective  today:  He feels well. Evan [name] is present for the visit. He has bad knees and is often sedentary. W/c in use today.    The fire department came to pick him up from the floor in July. The fall was secondary to a change in truck seat position that he was unaware of.     He denies recent hospitalizations, chest pain, or lightheadedness.  He is hydrating with 3-4 20 oz bottles per day but a coffee and maybe a Coca Cola.  He is avoiding nonsteroidals.  Home blood pressures: Ranges 119-138/60s. Rarely in the 140s.  Urine p/c ratio 1.37g from the prior 0.8g. On olmesartan 20 mg daily and not missing doses.   His last A1c was 7.2%, up from a  6.6-6.8% per his recollection.  His PCP, PA-C Evan [name], left and he meets his new PCP next month.  LE chronic edema: 2-3+ to the knees. No weeping edema since 2018. No recent water  blisters. Congratulated on the 30# weight loss since July of last year.    On olmesartan, Ozempic, sodium HCO3, furosemide , ferrous sulfate, vitamin D3, among others  Past Medical History:   Diagnosis Date    COPD (chronic obstructive pulmonary disease) (HCC)     Diabetes mellitus, type II (HCC)     Hypercholesteremia     Hypertension     Obesity, morbid (HCC)        Medications Ordered Prior to Encounter[1]    Allergies[2]          REVIEW OF SYSTEMS     NEGATIVE OR AS ADDRESSED ABOVE    PHYSICAL EXAM     BP 138/70   Pulse 77   SpO2 92%     Physical Exam   General: No acute distress  Resp: b/l symmetrical chest rise, no increased work of breathing. Lung sounds are clear.  Cardiac: RRR  Periph Vasc: 2-3+ LE edema b/l without weeping  Abdomen: Soft  Neuro: Alert and oriented   Psych: Mood and affect wnl      Previous labs (adapted from prior providers):    03/05/2024   BUN 41, creatinine 1.8, estimated GFR 38, sodium 142, potassium 4.9, bicarbonate 21, calcium 8.8, phosphorus 3.6, albumin 3.5, urine protein/creatinine ratio 1.37 g, total vitamin D  47.7, hemoglobin 11.5, PTH 66    10/23/2023  BUN 49, creatinine 1.91, estimated GFR 35, potassium 4.7, bicarbonate 19, phosphorus 4.4, calcium 9.2, albumin 3.6, transferrin saturation 24%, total vitamin D  44, hemoglobin 11.3, urine protein/creatinine ratio 0.8, ferritin 133, PTH 56      06/27/2023   BUN 43, creatinine 2.12, estimated GFR 31, sodium 140, potassium 5.0, bicarbonate 20, calcium 9.0, phosphorus 4.4, albumin 3.5, total vitamin D  45.5, PTH 64, hemoglobin 10.8,  urine protein/creatinine ratio 0.5 g,     06/26/2023  Transferrin saturation 22%, ferritin 92, vitamin B12 722,    04/13/2023  BUN 25, creatinine 1.89, estimated GFR 36, hemoglobin 9.2,    04/12/2023  BUN 22, creatinine 1.76, estimated GFR 39    02/2023 Cr 2.16, GFR 31, UPCR 0.7, Hb 11.7  02/04/23  BUN 66, creatinine 2.13, estimated GFR 31, sodium 143, potassium 5.4, bicarbonate 15, calcium 9.2, phosphorus 5.4, albumin 3.6, transferrin saturation 34%, total vitamin D  41,  Hemoglobin 12.6,   UA with hyaline cats, bacteria few.  Urine protein/creatinine ratio 1.0 g.  Albumin/creatinine ratio 452, ferritin 162, PTH 88  11/2022 creatinine 1.8, GFR 36, potassium 4.8, bicarb 17, UAwith 2+ protein, vitamin D  32, UPCR 2 g, A1c 6, PTH 98  09/2022 Hb 11  05/2022 Cr 1.73, GFR 40,   04/2022 Cr 1.7, GFR 41, Bic 20, K 5.3, UPCR 1.47  01/17/22  BUN 44  creatinine 1.74 GFR 48 sodium 143 potassium 4.5 bicarbonate 21 glucose 310 albumin 3.2 calcium 8.5 phosphorus 3.6 PTH 145  Hemoglobin 12.5  Microalbumin to creatinine ratio 692  A1c 6.5%  07/07/21 Cr 1.84, GFR 38, K 5.7, Glu 284,  Hb 11.5, UA w neg pro, blood, 0-2W, R, UPCR 0.5  12/08/19 sodium 145 potassium 4.1 chloride 112 carbon dioxide 19 anion gap 14 glucose 149 BUN 35 creatinine 1.68 eGFR 40 cc/min calcium 8.8 phosphorus 4.1 albumin 3.26, UACR 220 ug/mg  03/27/19 Hemoglobin 12.5 Sodium 142 Potassium 4.3 Chloride 109 Carbon dioxide 21 Anion gap 12 Glucose 150 BUN 38 Creatinine 1.58 eGFR 43 cc/min Calcium 9.1 Phosphorus 4.5 Albumin 3.8 UACR 107 ug/mg  08/07/18 BUN 39 creatinine 1.63 eGFR 42 cc/m  04/03/18  BUN 35 creatinine 1.51 eGFR 46 cc/min Urine protein 11 Urine creatinine 29 UPC 0.4 g/day 25-OH Vitamin D  29.2  11/14/17 BUN 35 creatinine 1.51 eGFR 46 cc/min  04/24/17 creatinine 1.67  12/31/16 creatinine 1.75  11/22/16 creatinine 1.46  11/20/16 creatinine 1.41  11/08/16 creatinine 1.40  08/20/16 creatinine 1.69  12/01/15 creatinine 1.37  11/08/14 creatinine 1.21  04/16/14 creatinine 1.40  11/05/13 creatinine 1.10        ASSESSMENT AND PLAN     1. Stage 3b chronic kidney disease (HCC)  Overview:  Status: Stable  Baseline Cr: 1.4-2  Likely cause: HTN/DM  Imaging: Uptodate  Urine protein: ++  ACE/ARB: Taking  SGLT2 inh: NotTaking    Assessment & Plan:  Stable creatinine of 1.8 mg/dl from the prior 1.9mg /dl, both being within his baseline range.  Urine p/c ratio 1.37g from the prior 0.8g. On olmesartan. On Ozempic. He will recheck that proteinuria excursion and if still high then will consider titrating the olmesartan, keeping in mind he has had hyperkalemia before. He will work to improve the A1c which rose to 7.2%  Blood pressure is controlled at home.  Continue to avoid oral nonteroidals.   Return in about 4 months (around 07/11/2024). with lab prior.       Orders:  -     Renal Function Panel -Routine; Future; Expected date:  07/11/2024  -     Hemoglobin -Routine; Future; Expected date: 07/11/2024  -     Ferritin -Routine; Future; Expected date: 07/11/2024  -     Iron & Total Iron Binding Capacity -Routine; Future; Expected date: 07/11/2024  -     25-OH Vitamin D  Total D2+D3 -Routine; Future; Expected date: 07/11/2024  -     PTH Hormone Intact -Routine; Future; Expected date: 07/11/2024  -  Total Protein/Creatinine Ratio, Urine, Random -Routine; Future; Expected date: 07/11/2024  -     Total Protein/Creatinine Ratio, Urine, Random -Routine; Future; Expected date: 03/25/2024    2. Hyperkalemia  Assessment & Plan:   Controlled potassium  of 4.7 mmol/L. On olmesartan. On furosemide .        3. Primary hypertension  Overview:  Acceptable control at home    Assessment & Plan:   Blood pressure is controlled at home.      4. Secondary hyperparathyroidism  Assessment & Plan:   Controlled with last PTH  66.    Orders:  -     25-OH Vitamin D  Total D2+D3 -Routine; Future; Expected date: 07/11/2024  -     PTH Hormone Intact -Routine; Future; Expected date: 07/11/2024    5. Metabolic acidosis  Overview:  Acceptable at this time    Assessment & Plan:   Controlled with a bicarbonate level of 21. On supplementation.       6. Iron deficiency anemia, unspecified iron deficiency anemia type  Overview:  Also hx of GIB in 03/2023.    Assessment & Plan:   Controlled. Minor anemia with a hemoglobin of 11.5 g/dl. On ferrous sulfate. Education provided on ESAs which he does not yet require and will not unless he is consistently under 10g/dl.    Orders:  -     Total Protein/Creatinine Ratio, Urine, Random -Routine; Future; Expected date: 07/11/2024  -     Total Protein/Creatinine Ratio, Urine, Random -Routine; Future; Expected date: 03/25/2024         Return in about 4 months (around 07/11/2024).         I addressed medical care services that are part of ongoing care related to a patient's single, serious condition or a complex condition OR serve as the  continuing focal point for all needed health care services.      This dictation was produced using voice recognition software. Despite concurrent proofreading, please note that homonyms and other transcription errors may be present and that these errors may not truly reflect my intent.     Chynna Buerkle, NP          [1]   Current Outpatient Medications on File Prior to Visit   Medication Sig Dispense Refill    albuterol  (PROAIR  HFA) 90 mcg/actuation inhaler INHALE ONE PUFF BY MOUTH EVERY 4 HOURS AS NEEDED      amLODIPine (NORVASC) 2.5 MG tablet Take 5 mg by mouth once daily.      aspirin  81 MG EC tablet 81 mg.      atorvastatin  (LIPITOR ) 40 mg tablet Take 40 mg by mouth once daily.      cholecalciferol  (VITAMIN D3) 25 mcg (1,000 unit) tablet Take 1 tablet by mouth once daily.      custom medication every night at bedtime. Med Name: cpap      cyanocobalamin (VITAMIN B-12) 1000 MCG tablet once daily.      diclofenac (VOLTAREN ARTHRITIS PAIN) 1 % topical gel Apply topically daily as needed. Effected area      docusate sodium  (COLACE) 100 mg capsule Take 100 mg by mouth once daily.      DULoxetine (CYMBALTA) 20 mg capsule Take 40 mg by mouth once daily.      ferrous sulfate 324 mg (65 mg Fe) EC tablet Take 324 mg by mouth daily with breakfast.      fluticasone-umeclidinium-vilanterol (TRELEGY ELLIPTA) 200-62.5-25 mcg inhaler Inhale 1 puff into the lungs once daily.  furosemide  (LASIX ) 20 mg tablet Take 1 tablet by mouth once daily. May take additional 20 mg tablet (total 40 mg ) as needed for weight gain 3 lb or edema 120 tablet 4    gabapentin (NEURONTIN) 300 mg capsule Take 300 mg by mouth 3 times daily. 1am, 1 noon, 2 night (Patient taking differently: Take 300 mg by mouth 3 times daily. 1am, 2 noon, 2 night)      HYDROcodone -acetaminophen  (NORCO ) 10-325 mg tablet Take 1 tablet by mouth once daily.      hydroxychloroquine (PLAQUENIL) 200 mg tablet Take 200 mg by mouth 2 times daily.      insulin  aspart  (NOVOLOG ) 100 unit/mL PEN Inject 22 Units under the skin 3 times daily with meals. 1 pen 0    insulin  glargine U-300 (TOUJEO  MAX U-300 SOLOSTAR) 300 unit/mL (3 mL) SubQ injection pen Inject under the skin every night at bedtime. Sliding scale 40-75 units      metoprolol  tartrate (LOPRESSOR ) 50 MG tablet Take 25 mg by mouth once daily.      multivit-min/ferrous fumarate (MULTI VITAMIN ORAL) Take 1 tablet by mouth once daily.      olmesartan (BENICAR) 40 mg tablet Take 20 mg by mouth once daily. Unknown dosage      pantoprazole (PROTONIX) 40 mg tablet Take 40 mg by mouth once daily. (Patient taking differently: Take 40 mg by mouth 2 times daily.)      semaglutide (OZEMPIC) 2 mg/dose (8 mg/3 mL) injection pen Inject 0.5 mg under the skin once a week. Rotate injection sites if injecting into the same area.      sodium bicarbonate  650 mg tablet Take 3 tablets by mouth 2 times daily. 540 tablet 1    testosterone (ANDROGEL) 12.5 mg/ 1.25 gram (1 %) GlPm Apply 4 Act topically Daily.      traMADol (ULTRAM) 50 mg tablet Take 50 mg by mouth 2 times daily.      triamcinolone (KENALOG) 0.1 % cream Apply  topically 2 times daily.       No current facility-administered medications on file prior to visit.   [2]   Allergies  Allergen Reactions    Lisinopril Anaphylaxis    Neomycin-Polymyxin B Gu Unspecified/Patient unsure    Sulfa (Sulfonamide Antibiotics) Other (See Comments)     Extrasystole      Levofloxacin  Rash     Maculopapular rash    Neomycin Rash

## 2024-03-11 NOTE — Assessment & Plan Note (Addendum)
 Stable creatinine of 1.8 mg/dl from the prior 1.9mg /dl, both being within his baseline range.  Urine p/c ratio 1.37g from the prior 0.8g. On olmesartan. On Ozempic. He will recheck that proteinuria excursion and if still high then will consider titrating the olmesartan, keeping in mind he has had hyperkalemia before. He will work to improve the A1c which rose to 7.2%  Blood pressure is controlled at home.  Continue to avoid oral nonteroidals.   Return in about 4 months (around 07/11/2024). with lab prior.

## 2024-03-11 NOTE — Assessment & Plan Note (Deleted)
 Controlled. Minor anemia with a hemoglobin of 11.5 g/dl. On ferrous sulfate. Education provided on ESAs which he does not yet require and will not unless he is consistently under 10g/dl.

## 2024-03-27 NOTE — Telephone Encounter (Signed)
 Total Protein/Creatinine Ratio, Urine, Random -Routine (03/27/2024)

## 2024-03-28 NOTE — Telephone Encounter (Signed)
 Increased urine protein. No UTIs or other recent urinary tract issues?  Increase the olmesartan to 40mg  daily (or 20mg  twice daily) for increased proteinuria management. But please call him after 1 week to see if his Bps are still reasonable since at the visit this 78 year old was already ranging 119-138/60s. Rarely in the 140s.   Renal panel 1 week after the change. Urine p/c ratio in mid December.

## 2024-03-30 MED ORDER — olmesartan (BENICAR) 40 mg tablet
40 | ORAL_TABLET | Freq: Every day | ORAL | 3 refills | 90.00000 days | Status: AC
Start: 2024-03-30 — End: ?

## 2024-03-30 NOTE — Telephone Encounter (Signed)
 Pt notified and expressed understanding, no UTI s/s.  He will increase olmesartan and check lab next week.

## 2024-03-30 NOTE — Addendum Note (Signed)
 Addended by: BILLY JARVIS on: 03/30/2024 03:27 PM     Modules accepted: Orders

## 2024-04-02 NOTE — Progress Notes (Signed)
 [facility] - UROLOGY    CONSULTATION NOTE:      Pt. Name/Age/DOB:  Evan [name]  78 y.o.    1946-05-16   Medical Record Number:  [id number]  Date of Consultation:  04/02/2024    Requesting/Referring Physician:  Fernand Elex Saxon, MD    Chief Complaint   Patient presents with   . New Patient     Kidney cyst        Encounter Diagnosis   Name Primary?   . Renal cell carcinoma of left kidney (HCC) Yes        HISTORY OF PRESENT ILLNESS:    [name] is a pleasant 78 y.o. male with history of obesity, DM2, kidney stones, left renal cell carcinoma status post cryoablation (10/03/2022). Patient presents to clinic for RCC monitoring..    Patient had a left renal lesion noted in 2023 with follow-up imaging confirming enhancing solid mass measuring 3.3 cm.  Patient was referred for cryoablation 10/03/22 with pathology demonstrating renal neoplasm with clear-cell features. Last CT 03/2023 did not demonstrate any recurrence of disease but did not include contrast. He denies dysuria, hematuria, flank pain.      He reports having a recent BMP with renal care but I do not have access to these records.        ASSESSMENT AND RECOMMENDATIONS:    Assessment & Plan  Renal cell carcinoma of left kidney (HCC)  Will continue with monitoring per NCCN guidelines with annual CT and CXR.   Orders:  .  Basic Metabolic Panel; Future  .  CT Abdomen w wo IV Contrast; Future  .  CT Abdomen w wo IV Contrast; Future  .  Basic Metabolic Panel; Future  .  XR Chest 2 Vw; Future  .  XR Chest 2 Vw; Future     PLAN  CT Abd w/wo, CXR - call with results  Follow up in 1 year with CT abd w/wo, CXR        MEDICATIONS:    Meds Prior to Visit:  Medications Ordered Prior to Encounter[1]        EXAM:  Review of Systems   Constitutional:  Negative for chills and fever.   Genitourinary:  Negative for dysuria, flank pain, frequency, hematuria and urgency.         Vital Signs:    BP: 150/65     SpO2: 90 % on         Weight most recent: Weight: (!)  183.7 kg (405 lb)        BMI: Body mass index is 53.43 kg/m?SABRA    PHYSICAL EXAM:      Physical Exam  Constitutional:       General: He is not in acute distress.     Appearance: Normal appearance. He is not ill-appearing, toxic-appearing or diaphoretic.   Neurological:      Mental Status: He is alert and oriented to person, place, and time.   Psychiatric:         Mood and Affect: Mood normal.         Behavior: Behavior normal.         Thought Content: Thought content normal.         Judgment: Judgment normal.       OTHER DATA:    Allergies:   Allergies[2]    Social History     Socioeconomic History   . Marital status: Married     Spouse name: Not on file   .  Number of children: Not on file   . Years of education: Not on file   . Highest education level: Not on file   Occupational History   . Not on file   Tobacco Use   . Smoking status: Former     Current packs/day: 0.00     Average packs/day: 2.0 packs/day for 50.0 years (100.0 ttl pk-yrs)     Types: Cigarettes     Start date: 04/28/1960     Quit date: 04/28/2010     Years since quitting: 13.9     Passive exposure: Past   . Smokeless tobacco: Never   Vaping Use   . Vaping status: Never Used   Substance and Sexual Activity   . Alcohol  use: Not Currently     Alcohol /week: 1.0 standard drink of alcohol      Types: 1 Shots of liquor per week     Comment: once a week   . Drug use: No   . Sexual activity: Not Currently     Partners: Female   Other Topics Concern   . Not on file   Social History Narrative   . Not on file     Social Drivers of Health     Financial Resource Strain: Low Risk  (04/19/2021)    Received from [facility]    Financial Resource Strain    . Difficulty of Paying Living Expenses: Not on file    . Access to Reliable Phone: Not on file   Food Insecurity: No Food Insecurity (09/02/2023)    Hunger Vital Sign    . Worried About Programme researcher, broadcasting/film/video in the Last Year: Never true    . Ran Out of Food in the Last Year: Never true   Transportation Needs:  No Transportation Needs (09/02/2023)    PRAPARE - Transportation    . Lack of Transportation (Medical): No    . Lack of Transportation (Non-Medical): No   Physical Activity: Not on file   Stress: Not on file   Social Connections: Not on file   Intimate Partner Violence: Unknown (04/19/2021)    Received from [facility]    Intimate Partner Violence    . Fear of Current or Ex-Partner: Not on file    . Emotionally Abused: Not on file    . Physically Abused: Not on file    . Sexually Abused: Not on file    . Questions Apply to Other Individual: Not on file       Family History[3]      Diagnostic Studies:  Available Labs and Images were reviewed personally.  Significant results and findings are addressed in the HPI or Assessment and Plan.    Electronically signed by: Evan PARAS. Trinkler, PA-C    04/02/2024   11:36 AM PDT         This note was transcribed using speech recognition software. Please contact us  for clarification if any questions arise relating to the  errors/wording of this document.             [1]  Current Outpatient Medications on File Prior to Visit   Medication Sig Dispense Refill   . albuterol  90 mcg/puff inhaler INHALE 1 PUFF BY MOUTH EVERY 4 HOURS AS NEEDED 6.7 g 0   . Ambulatory Compound Builder Glucometer, test strips, lancets, alcohol  swabs.. 100 each 3   . amLODIPine (NORVASC) 2.5 mg tablet TAKE 2 TABLETS BY MOUTH DAILY 180 tablet 2   . ammonium lactate (LAC-HYDRIN)  12% cream Apply to toes once daily.. 385 g 3   . aspirin  81 MG EC tablet Take 81 mg by mouth Daily.     . atorvaSTATin  (LIPITOR ) 40 mg tablet TAKE 1 TABLET BY MOUTH EVERY EVENING. 90 tablet 2   . Blood Glucose Monitoring Suppl (ONETOUCH VERIO) w/Device KIT Use to check blood glucose 4 times daily. On insulin . 1 each 0   . cyanocobalamin (VITAMIN B-12) 1,000 mcg tablet Take 1,000 mcg by mouth 2 times daily.     . diclofenac (VOLTAREN) 1% GEL Apply 4 g topically 4 times daily as needed (for pain and inflammation). Apply to the  affected area up to 4 times daily. 100 g 3   . DULoxetine (IRENKA) 40 mg DR capsule Take 1 capsule by mouth Daily. 90 capsule 3   . ferrous sulfate (FEROSUL) 325 mg tablet TAKE 1 TABLET BY MOUTH DAILY WITH BREAKFAST 90 tablet 2   . fluticasone-umeclidinium-vilanterol (TRELEGY ELLIPTA) 200-62.5-25 mcg/puff inhaler INHALE 1 PUFF INTO THE LUNGS DAILY 180 each 3   . furosemide  (LASIX ) 20 mg tablet Take 1 tablet by mouth Daily.     SABRA gabapentin (NEURONTIN) 300 mg capsule TAKE 1 CAPSULE BY MOUTH IN THE MORNING AND 1 CAPSULE IN THE AFTERNOON AND 2 CAPSULES AT BEDTIME. 360 capsule 1   . glucose blood VI test strips (ONETOUCH VERIO) strip Use to check blood sugar 3 times daily and as needed for symptoms. Uses insulin . 400 each 2   . hydroxychloroquine (PLAQUENIL) 200 mg tablet Take 1 tablet by mouth 2 times daily.     . insulin  pen needle (B-D ULTRAFINE III SHORT PEN) 31 gauge x 8 mm Use 2 times daily. 200 each 3   . Insulin  Syringe-Needle U-100 (INSULIN  SYRINGE 1CC/31GX5/16) 31G X 5/16 1 ML MISC Use to inject insulin  twice daily.. 200 each 2   . JARDIANCE  10 MG tablet Take 1 tablet by mouth Daily.     SABRA leflunomide (ARAVA) 20 mg tablet Take 1 tablet by mouth Daily. 30 tablet    . metoprolol  tartrate (LOPRESSOR ) 25 mg tablet Take 1 tablet by mouth Daily. 90 tablet 2   . Misc. Devices [facility]) MISC 1 Units as needed Heavy Duty. 1 each 0   . Needle, Disp, 30G X 5/16 MISC 1 each by Does not apply route Daily. 100 each 3   . olmesartan (BENICAR) 40 MG tablet TAKE 1/2 TABLET BY MOUTH DAILY 45 tablet 2   . pantoprazole (PROTONIX) 40 mg tablet TAKE 1 TABLET BY MOUTH TWICE DAILY BEFORE MEALS 180 tablet 2   . predniSONE  (DELTASONE ) 10 mg tablet TAKE 1 TABLET BY MOUTH TWICE DAILY AS NEEDED FOR RHEUMATOID ARTHRITIS FLARES     . semaglutide (OZEMPIC, 1 MG/DOSE,) 1 mg/dose injection (4 mg/3 mL) Inject 1 mg under the skin Once a week. 3 mL 3   . sodium bicarbonate  650 mg tablet Take by mouth DISSOLVE THREE TABS EVERY MORNING AND  TWO TABS EVERY EVENING.  0   . testosterone 12.5 mg/1.25 g actuation (1%) gel APPLY 4 PUMPS TO THE SKIN EVERY DAY. 150 g 5   . torsemide  (DEMADEX ) 10 mg tablet Take 1 tablet by mouth Daily.     . TOUJEO  SOLOSTAR 300 UNIT/ML concentrated injection (pen) Inject 50 Units under the skin every 12 hours. 10 mL 3   . traMADol (ULTRAM) 50 mg tablet Take 1 tablet by mouth 2 times daily as needed for Pain.     . triamcinolone (KENALOG) 0.1%  cream APPLY TOPICALLY TWO TIMES A DAY. 45 g 2   . UNCODED DME Autoset BIPAP - set to 23/18 CWP, heated, humidity and all supplies. Nasal Mask.   Length of need: 99  Diagnosis: OSA  PAP supplies with replacements as follows or as insurance permits:  Heated Humidifier    Mask interface, 1 per 3 months  Cushion replacement, 2 per month  Pillow set, replacement, 2 set per month  Full face mask, 1 per 3 months  Full face cushion replacement, 1 per month  Headgear, 1 per 6 months  Heated/nonheated tubing, 1 per 3 months  Filter, disposable, 2 per month  Filter, nondisposable, 1 per 6 months  Chinstrap, 1 per 6 months  Humidifier chamber, 1 per 6 months.  Multiple mask types worn   DME: [employer]    Electronically Signed by Luane Has   NPI - [id number]. 1 each 0     No current facility-administered medications on file prior to visit.   [2]  Allergies  Allergen Reactions   . Lisinopril Swelling and Anaphylaxis     tonue   . Neomycin-Polymyxin B Gu    . Sulfa Antibiotics      Eyedrops - eyes turned red   . Levofloxacin  Rash   [3]  Family History  Problem Relation Name Age of Onset   . Other (see comment) Mother          adopted parent   . Other (see comment) Father          adopted parent   . Other (see comment) Daughter x2         healthy   . Other (see comment) Son x1         healhty

## 2024-04-02 NOTE — Telephone Encounter (Signed)
-----   Message from [employer]. Jacklynn, PA-C sent at 04/02/2024 11:33 AM PDT -----  Can we get a message sent to patient, let them know I also placed a chest xray order he can have done this year with the ct and next year with ct. thanks

## 2024-04-02 NOTE — Telephone Encounter (Signed)
 My chart message sent

## 2024-04-09 NOTE — Telephone Encounter (Signed)
 LDVM reminding him about lab and the need for bp's

## 2024-04-10 NOTE — Telephone Encounter (Signed)
 Scan/TX Outside Lab Results (04/09/2024)

## 2024-04-10 NOTE — Telephone Encounter (Signed)
 See lab below, pt has not returned my call with bp's yet

## 2024-04-14 NOTE — Telephone Encounter (Signed)
 lvm

## 2024-04-14 NOTE — Telephone Encounter (Signed)
 Proteinuria is worse again. Please confirm that he has been using the olmesartan at 40mg  daily for the last 2 weeks? Blood sugars are doing ok? This 78 year old had an A1c of 7.2% on 12/03/23, which was reasonable for his age.    *Though he denied s/s of UTI on 03/30/24 lets check UA as I am uncertain why his proteinuria is worsening. Also please see if he can do a 24 hr urine for protein.

## 2024-04-15 NOTE — Addendum Note (Signed)
 Addended by: BILLY JARVIS on: 04/15/2024 10:52 AM     Modules accepted: Orders

## 2024-04-15 NOTE — Telephone Encounter (Signed)
 Pt denies UTI and states he is taking olmesartan 40mg .  Orders faxed to [facility], pt will go in and get the jug.

## 2024-04-22 NOTE — Telephone Encounter (Signed)
 Total Protein, Urine, 24 Hour -Routine (04/22/2024)    Urinalysis with microscopic -Routine (04/17/2024)

## 2024-04-25 NOTE — Telephone Encounter (Signed)
 Progressively worsening proteinuria. 24 hr urine protein 2.4g, from the spot check of 2.1g and before that it was around 1.3g.  If he is without hx of frequent UTIs/genital rashes then ok to start on Farxiga  10mg  daily. Renal panel 2 weeks later. Urine p/c ratio and UA after 6 weeks of initiation.

## 2024-04-27 MED ORDER — DAPAGLIFLOZIN PROPANEDIOL 10 MG TABLET
10 | ORAL_TABLET | Freq: Every day | ORAL | 11 refills | 30.00000 days | Status: AC
Start: 2024-04-27 — End: 2025-04-27

## 2024-04-27 NOTE — Telephone Encounter (Signed)
 lvm

## 2024-04-27 NOTE — Addendum Note (Signed)
 Addended by: BILLY JARVIS on: 04/27/2024 02:55 PM     Modules accepted: Orders

## 2024-04-27 NOTE — Telephone Encounter (Signed)
 Pt notified and expressed understanding.  Lab orders faxed to [facility]

## 2024-04-29 NOTE — Telephone Encounter (Signed)
 RX refill request    Pharmacy (Name & location): [facility] on [address]    Is this a new pharmacy?: no    Have you already contacted your pharmacy? yes      Medication Being Requested: Olmesartan    Dose & Frequency: 40mg  1 x daily    How many days (or pills) remaining of medication: enough for the rest of this week    INFORM Patient    That it may take up to 48-72 hours to fill prescription.    We do not call patients back to confirm RX has been sent.     We will call if we have a question regarding their prescription.

## 2024-05-26 NOTE — Telephone Encounter (Signed)
 Lvm reminding him about lab due

## 2024-06-01 LAB — MICROALBUMIN/CREATININE RATIO
Microalbumin, Urine, Random: 99 mg/dL
Microalbumin/Creatinine Ratio: 1941.2 ??g/mg creatinine — ABNORMAL HIGH (ref <30.0)

## 2024-06-01 LAB — TOTAL PROTEIN/CREATININE RATIO, URINE, RANDOM
Creatinine, Urine, Random: 51 mg/dL
Total Protein Creatinine Ratio: 3.02 mg/mg — ABNORMAL HIGH (ref <0.20)
Total Protein, Random, Urine: 154 mg/dL

## 2024-06-02 NOTE — Telephone Encounter (Signed)
 Total Protein/Creatinine Ratio, Urine, Random -Routine (06/01/2024 14:45)  Microalbumin/Creatinine Ratio -Routine (06/01/2024 14:45)

## 2024-06-02 NOTE — Telephone Encounter (Signed)
 Missed lab letter and order mailed to patient

## 2024-06-05 NOTE — Telephone Encounter (Signed)
 Dr Fernand,   Progressive proteinuria: I titrated olmesartan . checked a 24 hr urine protein in November which was 2.4g, then added Farxiga  yet the urine protein/Cr ratio is now >3g.   Anything else you want?     Daphne,  It looks like the UA from November was never completed despite him doing a urine collection for the Nov and Dec proteinuria studies. I imagine it can't be added so can he go in specifically for a UA with micro?

## 2024-06-08 ENCOUNTER — Other Ambulatory Visit: Admit: 2024-06-08 | Discharge: 2024-06-08 | Payer: MEDICARE

## 2024-06-08 DIAGNOSIS — N1832 Chronic kidney disease, stage 3b: Secondary | ICD-10-CM

## 2024-06-08 LAB — UA WITH AUTO MICRO
Ascorbic Acid, Urine: NEGATIVE
Bacteria, Urine: NONE SEEN /HPF
Bilirubin, Urine: NEGATIVE
Blood, Urine: NEGATIVE
Glucose, Urine: NEGATIVE mg/dL
Hyaline Casts, Urine: 0 /LPF (ref <=5)
Ketones, Urine: NEGATIVE mg/dL
Leukocyte Esterase, Urine: NEGATIVE
Nitrite, Urine: NEGATIVE
RBC, Urine: 1 /HPF (ref <=5)
Specific Gravity, Urine: 1.011 (ref 1.003–1.030)
Squamous Epithelial, Urine: 1 /HPF (ref <=5)
Urobilinogen, Urine: NORMAL mg/dL
White Blood Cell Microscopic Numeric: 1 /HPF (ref <=9)
pH, UA: 6 (ref 5.0–9.0)

## 2024-06-08 LAB — TOTAL PROTEIN/CREATININE RATIO, URINE, RANDOM
Creatinine, Urine, Random: 29 mg/dL
Total Protein Creatinine Ratio: 3.03 mg/mg — ABNORMAL HIGH (ref <0.20)
Total Protein, Random, Urine: 88 mg/dL

## 2024-06-08 NOTE — Telephone Encounter (Signed)
 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

## 2024-06-08 NOTE — Telephone Encounter (Signed)
 Brandi,   Please have the patient do the ordered studies to work up the proteinuria.   - C3, C4, free light chains, SPEP

## 2024-06-08 NOTE — Addendum Note (Signed)
 Addended by: TRELLIS GARRE on: 06/08/2024 05:25 PM     Modules accepted: Orders

## 2024-06-09 NOTE — Telephone Encounter (Signed)
 Total Protein/Creatinine Ratio, Urine, Random -Routine (06/08/2024 14:10)  Urinalysis with microscopic -Routine (06/08/2024 14:40)

## 2024-06-10 NOTE — Telephone Encounter (Signed)
 noted

## 2024-06-10 NOTE — Telephone Encounter (Signed)
 Noted, I will discuss with him further at his appointment in January

## 2024-07-14 NOTE — Addendum Note (Signed)
 Addended by: LINDSAY KLINEFELTER on: 07/14/2024 10:36 AM     Modules accepted: Orders

## 2024-07-14 NOTE — Telephone Encounter (Signed)
 Lvm to c/b to inform of needing to get labs done this week or next week.

## 2024-07-14 NOTE — Telephone Encounter (Addendum)
 Lets have him do preappointment labs, sometime this week or next week so we can review and go from there

## 2024-07-14 NOTE — Telephone Encounter (Signed)
 Labs updated

## 2024-07-14 NOTE — Telephone Encounter (Signed)
 Pt called in to r/s appt on 1/28 due to having the flu, was r/s to next available on 3/4. Pt plans to get labs done the week prior to appt, is this okay?

## 2024-07-15 ENCOUNTER — Encounter: Payer: MEDICARE | Attending: Student in an Organized Health Care Education/Training Program

## 2024-07-17 NOTE — Telephone Encounter (Signed)
 Jan from the lab called asking if ok to draw labs next week for pt. Since he will be drawing labs for his PCP. Per dr. Fernand note below, ok to draw pre appointment labs.

## 2024-07-22 ENCOUNTER — Other Ambulatory Visit: Admit: 2024-07-22 | Discharge: 2024-07-22 | Payer: MEDICARE

## 2024-07-22 DIAGNOSIS — D62 Acute posthemorrhagic anemia: Principal | ICD-10-CM

## 2024-07-22 LAB — CBC WITHOUT DIFFERENTIAL
HCT: 35.4 % — ABNORMAL LOW (ref 42.0–54.0)
Hemoglobin: 11.7 g/dL — ABNORMAL LOW (ref 12.0–18.0)
MCH: 31.8 pg (ref 27.0–34.0)
MCHC: 33 g/dL (ref 32.0–36.0)
MCV: 96.4 fL (ref 81.0–99.0)
MPV: 9.2 fL (ref 7.4–10.4)
Platelet Count: 249 10*3/??L (ref 150–400)
RBC: 3.67 10*6/??L — ABNORMAL LOW (ref 4.70–6.10)
RDW: 15.3 % — ABNORMAL HIGH (ref 11.5–14.5)
WBC: 10.2 10*3/??L (ref 4.8–10.8)

## 2024-07-22 LAB — SERUM FREE LIGHT CHAINS
KAPPA/LAMBDA RATIO: 1.6 (ref 0.26–1.65)
Kappa Free Light Chains: 11.11 mg/dL — ABNORMAL HIGH (ref 0.33–1.94)
Lambda Free Light Chains: 6.93 mg/dL — ABNORMAL HIGH (ref 0.57–2.63)

## 2024-07-22 LAB — RENAL FUNCTION PANEL
Albumin: 3 g/dL — ABNORMAL LOW (ref 3.5–5.0)
Anion Gap: 5 mmol/L (ref 4–13)
BUN: 37 mg/dL — ABNORMAL HIGH (ref 6–23)
CO2 - Carbon Dioxide: 29 mmol/L (ref 21–31)
Calcium: 8.3 mg/dL — ABNORMAL LOW (ref 8.6–10.3)
Chloride: 109 mmol/L (ref 98–111)
Creatinine: 1.67 mg/dL — ABNORMAL HIGH (ref 0.65–1.30)
Glomerular Filtration Rate Estimate (Male): 41 mL/min/{1.73_m2} — ABNORMAL LOW (ref >=60)
Glucose: 171 mg/dL — ABNORMAL HIGH (ref 80–99)
Phosphorus: 3.9 mg/dL (ref 2.5–4.5)
Potassium: 4.4 mmol/L (ref 3.5–5.1)
Sodium: 143 mmol/L (ref 135–143)

## 2024-07-22 LAB — 25-OH HYDROXY VITAMIN D, CALCIFEROL, TOTAL OF D2 & D3: Vitamin D, 25-OH Total: 24.8 ng/mL — ABNORMAL LOW (ref 30.0–100.0)

## 2024-07-22 LAB — TOTAL PROTEIN/CREATININE RATIO, URINE, RANDOM
Creatinine, Urine, Random: 72 mg/dL
Total Protein Creatinine Ratio: 5.68 mg/mg — ABNORMAL HIGH (ref <0.20)
Total Protein, Random, Urine: 409 mg/dL

## 2024-07-22 LAB — PTH, INTACT: PTH: 97 pg/mL — ABNORMAL HIGH (ref 12.0–88.0)

## 2024-07-22 LAB — C3 COMPLEMENT: C3 Complement: 139.6 mg/dL (ref 81.10–157.00)

## 2024-07-22 LAB — C4 COMPLEMENT: C4 Complement: 31.1 mg/dL (ref 12.9–39.2)

## 2024-07-22 LAB — IRON AND TIBC
Iron Saturation: 25 % (ref 18–54)
Iron: 76 g/dL (ref 40–160)
Total Iron Binding Capacity: 303 g/dL (ref 261–471)
Transferrin: 212 mg/dL (ref 200–362)

## 2024-07-22 LAB — HEMOGLOBIN: Hemoglobin: 11.7 g/dL — ABNORMAL LOW (ref 12.0–18.0)

## 2024-07-22 LAB — IMMUNOFIXATION ELECTROPHORESIS, SERUM
